# Patient Record
Sex: Female | Born: 1965 | Race: White | Hispanic: No | Marital: Married | Smoking: Former smoker
Health system: Southern US, Community
[De-identification: ages and names within clinical notes are randomized; demographics above are authoritative.]

## PROBLEM LIST (undated history)

## (undated) DIAGNOSIS — F329 Major depressive disorder, single episode, unspecified: Secondary | ICD-10-CM

## (undated) DIAGNOSIS — E8770 Fluid overload, unspecified: Secondary | ICD-10-CM

## (undated) DIAGNOSIS — J449 Chronic obstructive pulmonary disease, unspecified: Secondary | ICD-10-CM

## (undated) DIAGNOSIS — K219 Gastro-esophageal reflux disease without esophagitis: Secondary | ICD-10-CM

## (undated) DIAGNOSIS — M1460 Charcot's joint, unspecified site: Secondary | ICD-10-CM

## (undated) DIAGNOSIS — D649 Anemia, unspecified: Secondary | ICD-10-CM

## (undated) HISTORY — PX: APPENDECTOMY: SHX54

## (undated) HISTORY — PX: CHOLECYSTECTOMY: SHX55

---

## 2002-05-25 ENCOUNTER — Emergency Department (HOSPITAL_COMMUNITY): Admission: EM | Admit: 2002-05-25 | Discharge: 2002-05-25 | Payer: Self-pay | Admitting: Emergency Medicine

## 2002-06-09 ENCOUNTER — Encounter: Admission: RE | Admit: 2002-06-09 | Discharge: 2002-06-09 | Payer: Self-pay | Admitting: Internal Medicine

## 2002-06-17 ENCOUNTER — Encounter: Admission: RE | Admit: 2002-06-17 | Discharge: 2002-06-17 | Payer: Self-pay | Admitting: Internal Medicine

## 2002-06-24 ENCOUNTER — Inpatient Hospital Stay (HOSPITAL_COMMUNITY): Admission: AD | Admit: 2002-06-24 | Discharge: 2002-06-29 | Payer: Self-pay | Admitting: Internal Medicine

## 2002-06-24 ENCOUNTER — Encounter: Admission: RE | Admit: 2002-06-24 | Discharge: 2002-06-24 | Payer: Self-pay | Admitting: Internal Medicine

## 2002-06-25 ENCOUNTER — Encounter: Payer: Self-pay | Admitting: Neurology

## 2002-06-25 ENCOUNTER — Encounter: Payer: Self-pay | Admitting: Cardiology

## 2002-06-26 ENCOUNTER — Encounter: Payer: Self-pay | Admitting: Neurology

## 2002-06-28 ENCOUNTER — Encounter: Payer: Self-pay | Admitting: Internal Medicine

## 2002-07-12 ENCOUNTER — Encounter: Admission: RE | Admit: 2002-07-12 | Discharge: 2002-07-12 | Payer: Self-pay | Admitting: Internal Medicine

## 2002-07-29 ENCOUNTER — Encounter: Admission: RE | Admit: 2002-07-29 | Discharge: 2002-07-29 | Payer: Self-pay | Admitting: Internal Medicine

## 2002-08-25 ENCOUNTER — Encounter: Payer: Self-pay | Admitting: Internal Medicine

## 2002-08-25 ENCOUNTER — Encounter: Admission: RE | Admit: 2002-08-25 | Discharge: 2002-08-25 | Payer: Self-pay | Admitting: Internal Medicine

## 2002-08-25 ENCOUNTER — Ambulatory Visit (HOSPITAL_COMMUNITY): Admission: RE | Admit: 2002-08-25 | Discharge: 2002-08-25 | Payer: Self-pay | Admitting: Internal Medicine

## 2002-08-30 ENCOUNTER — Encounter: Admission: RE | Admit: 2002-08-30 | Discharge: 2002-08-30 | Payer: Self-pay | Admitting: Internal Medicine

## 2002-10-15 ENCOUNTER — Encounter: Admission: RE | Admit: 2002-10-15 | Discharge: 2002-10-15 | Payer: Self-pay | Admitting: Internal Medicine

## 2002-10-28 ENCOUNTER — Encounter: Admission: RE | Admit: 2002-10-28 | Discharge: 2002-10-28 | Payer: Self-pay | Admitting: Infectious Diseases

## 2003-01-28 ENCOUNTER — Encounter: Admission: RE | Admit: 2003-01-28 | Discharge: 2003-01-28 | Payer: Self-pay | Admitting: Internal Medicine

## 2003-02-09 ENCOUNTER — Encounter: Admission: RE | Admit: 2003-02-09 | Discharge: 2003-02-09 | Payer: Self-pay | Admitting: Internal Medicine

## 2003-03-14 ENCOUNTER — Encounter: Admission: RE | Admit: 2003-03-14 | Discharge: 2003-03-14 | Payer: Self-pay | Admitting: Internal Medicine

## 2003-03-16 ENCOUNTER — Inpatient Hospital Stay (HOSPITAL_COMMUNITY): Admission: EM | Admit: 2003-03-16 | Discharge: 2003-03-18 | Payer: Self-pay | Admitting: Emergency Medicine

## 2003-04-01 ENCOUNTER — Encounter: Admission: RE | Admit: 2003-04-01 | Discharge: 2003-04-01 | Payer: Self-pay | Admitting: Internal Medicine

## 2003-04-04 ENCOUNTER — Encounter (HOSPITAL_BASED_OUTPATIENT_CLINIC_OR_DEPARTMENT_OTHER): Admission: RE | Admit: 2003-04-04 | Discharge: 2003-07-03 | Payer: Self-pay | Admitting: Internal Medicine

## 2003-06-28 ENCOUNTER — Encounter: Admission: RE | Admit: 2003-06-28 | Discharge: 2003-06-28 | Payer: Self-pay | Admitting: Internal Medicine

## 2003-07-26 ENCOUNTER — Encounter: Admission: RE | Admit: 2003-07-26 | Discharge: 2003-07-26 | Payer: Self-pay | Admitting: Internal Medicine

## 2003-08-11 ENCOUNTER — Encounter: Admission: RE | Admit: 2003-08-11 | Discharge: 2003-08-11 | Payer: Self-pay | Admitting: Internal Medicine

## 2003-08-29 ENCOUNTER — Encounter: Admission: RE | Admit: 2003-08-29 | Discharge: 2003-08-29 | Payer: Self-pay | Admitting: Internal Medicine

## 2003-12-11 ENCOUNTER — Ambulatory Visit (HOSPITAL_BASED_OUTPATIENT_CLINIC_OR_DEPARTMENT_OTHER): Admission: RE | Admit: 2003-12-11 | Discharge: 2003-12-11 | Payer: Self-pay | Admitting: Internal Medicine

## 2004-01-11 ENCOUNTER — Ambulatory Visit: Payer: Self-pay | Admitting: Internal Medicine

## 2004-01-31 ENCOUNTER — Observation Stay: Payer: Self-pay | Admitting: Internal Medicine

## 2004-02-05 ENCOUNTER — Emergency Department: Payer: Self-pay | Admitting: Emergency Medicine

## 2004-02-09 ENCOUNTER — Ambulatory Visit: Payer: Self-pay | Admitting: Internal Medicine

## 2004-02-27 ENCOUNTER — Ambulatory Visit: Payer: Self-pay | Admitting: Internal Medicine

## 2004-03-08 ENCOUNTER — Ambulatory Visit: Payer: Self-pay | Admitting: Internal Medicine

## 2004-03-13 ENCOUNTER — Ambulatory Visit: Payer: Self-pay | Admitting: Internal Medicine

## 2004-07-05 ENCOUNTER — Ambulatory Visit: Payer: Self-pay | Admitting: Internal Medicine

## 2004-09-17 ENCOUNTER — Emergency Department: Payer: Self-pay | Admitting: Emergency Medicine

## 2004-10-08 ENCOUNTER — Ambulatory Visit: Payer: Self-pay | Admitting: Internal Medicine

## 2004-11-20 ENCOUNTER — Ambulatory Visit: Payer: Self-pay | Admitting: Hospitalist

## 2004-12-06 ENCOUNTER — Ambulatory Visit: Payer: Self-pay | Admitting: Internal Medicine

## 2004-12-18 ENCOUNTER — Ambulatory Visit: Payer: Self-pay | Admitting: Internal Medicine

## 2004-12-25 ENCOUNTER — Ambulatory Visit: Payer: Self-pay | Admitting: Internal Medicine

## 2005-02-08 ENCOUNTER — Emergency Department: Payer: Self-pay | Admitting: Emergency Medicine

## 2005-03-09 ENCOUNTER — Emergency Department: Payer: Self-pay | Admitting: Emergency Medicine

## 2005-03-11 ENCOUNTER — Ambulatory Visit: Payer: Self-pay | Admitting: Hospitalist

## 2005-04-03 ENCOUNTER — Emergency Department: Payer: Self-pay | Admitting: Emergency Medicine

## 2005-04-09 ENCOUNTER — Emergency Department: Payer: Self-pay | Admitting: Emergency Medicine

## 2005-04-17 ENCOUNTER — Emergency Department: Payer: Self-pay | Admitting: Emergency Medicine

## 2005-09-17 ENCOUNTER — Ambulatory Visit: Payer: Self-pay | Admitting: Internal Medicine

## 2005-11-26 ENCOUNTER — Emergency Department: Payer: Self-pay | Admitting: Emergency Medicine

## 2005-12-07 ENCOUNTER — Inpatient Hospital Stay: Payer: Self-pay | Admitting: Internal Medicine

## 2005-12-29 ENCOUNTER — Inpatient Hospital Stay: Payer: Self-pay | Admitting: Internal Medicine

## 2006-01-06 DIAGNOSIS — I872 Venous insufficiency (chronic) (peripheral): Secondary | ICD-10-CM

## 2006-01-06 DIAGNOSIS — G600 Hereditary motor and sensory neuropathy: Secondary | ICD-10-CM | POA: Insufficient documentation

## 2006-01-06 DIAGNOSIS — E785 Hyperlipidemia, unspecified: Secondary | ICD-10-CM | POA: Insufficient documentation

## 2006-01-06 DIAGNOSIS — J309 Allergic rhinitis, unspecified: Secondary | ICD-10-CM | POA: Insufficient documentation

## 2006-01-06 DIAGNOSIS — R609 Edema, unspecified: Secondary | ICD-10-CM | POA: Insufficient documentation

## 2006-01-06 DIAGNOSIS — F32A Depression, unspecified: Secondary | ICD-10-CM | POA: Insufficient documentation

## 2006-01-06 DIAGNOSIS — F17200 Nicotine dependence, unspecified, uncomplicated: Secondary | ICD-10-CM | POA: Insufficient documentation

## 2006-01-06 DIAGNOSIS — K009 Disorder of tooth development, unspecified: Secondary | ICD-10-CM | POA: Insufficient documentation

## 2006-01-06 DIAGNOSIS — M545 Low back pain, unspecified: Secondary | ICD-10-CM | POA: Insufficient documentation

## 2006-01-06 DIAGNOSIS — K149 Disease of tongue, unspecified: Secondary | ICD-10-CM | POA: Insufficient documentation

## 2006-01-06 DIAGNOSIS — G4730 Sleep apnea, unspecified: Secondary | ICD-10-CM | POA: Insufficient documentation

## 2006-01-06 DIAGNOSIS — Z981 Arthrodesis status: Secondary | ICD-10-CM | POA: Insufficient documentation

## 2006-01-06 DIAGNOSIS — B379 Candidiasis, unspecified: Secondary | ICD-10-CM | POA: Insufficient documentation

## 2006-01-06 DIAGNOSIS — F329 Major depressive disorder, single episode, unspecified: Secondary | ICD-10-CM | POA: Insufficient documentation

## 2006-01-06 DIAGNOSIS — K219 Gastro-esophageal reflux disease without esophagitis: Secondary | ICD-10-CM | POA: Insufficient documentation

## 2006-01-06 DIAGNOSIS — K006 Disturbances in tooth eruption: Secondary | ICD-10-CM | POA: Insufficient documentation

## 2006-01-06 DIAGNOSIS — E876 Hypokalemia: Secondary | ICD-10-CM | POA: Insufficient documentation

## 2006-01-16 ENCOUNTER — Ambulatory Visit: Payer: Self-pay | Admitting: Internal Medicine

## 2006-01-16 ENCOUNTER — Encounter (INDEPENDENT_AMBULATORY_CARE_PROVIDER_SITE_OTHER): Payer: Self-pay | Admitting: Unknown Physician Specialty

## 2006-01-16 LAB — CONVERTED CEMR LAB
Amphetamine Screen, Ur: NEGATIVE
Barbiturate Quant, Ur: NEGATIVE
Benzodiazepines.: NEGATIVE
Marijuana Metabolite: NEGATIVE
Methadone: NEGATIVE
Opiates: POSITIVE — AB
Propoxyphene: NEGATIVE

## 2006-01-25 ENCOUNTER — Emergency Department: Payer: Self-pay | Admitting: Emergency Medicine

## 2006-01-25 ENCOUNTER — Other Ambulatory Visit: Payer: Self-pay

## 2006-02-04 ENCOUNTER — Encounter: Admission: RE | Admit: 2006-02-04 | Discharge: 2006-02-04 | Payer: Self-pay | Admitting: Internal Medicine

## 2006-02-04 ENCOUNTER — Ambulatory Visit: Payer: Self-pay | Admitting: Cardiology

## 2006-02-04 ENCOUNTER — Ambulatory Visit: Payer: Self-pay | Admitting: Hospitalist

## 2006-02-04 ENCOUNTER — Inpatient Hospital Stay (HOSPITAL_COMMUNITY): Admission: AD | Admit: 2006-02-04 | Discharge: 2006-02-08 | Payer: Self-pay | Admitting: Hospitalist

## 2006-02-04 ENCOUNTER — Encounter: Payer: Self-pay | Admitting: Vascular Surgery

## 2006-02-04 ENCOUNTER — Ambulatory Visit: Payer: Self-pay | Admitting: Internal Medicine

## 2006-02-05 ENCOUNTER — Encounter: Payer: Self-pay | Admitting: Cardiology

## 2006-02-07 ENCOUNTER — Encounter (INDEPENDENT_AMBULATORY_CARE_PROVIDER_SITE_OTHER): Payer: Self-pay | Admitting: *Deleted

## 2006-02-08 DIAGNOSIS — A0472 Enterocolitis due to Clostridium difficile, not specified as recurrent: Secondary | ICD-10-CM | POA: Insufficient documentation

## 2006-02-13 ENCOUNTER — Observation Stay: Payer: Self-pay | Admitting: Internal Medicine

## 2006-02-17 ENCOUNTER — Ambulatory Visit: Payer: Self-pay | Admitting: Gastroenterology

## 2006-02-27 ENCOUNTER — Emergency Department: Payer: Self-pay | Admitting: Emergency Medicine

## 2006-03-20 ENCOUNTER — Telehealth (INDEPENDENT_AMBULATORY_CARE_PROVIDER_SITE_OTHER): Payer: Self-pay | Admitting: *Deleted

## 2006-04-18 ENCOUNTER — Telehealth (INDEPENDENT_AMBULATORY_CARE_PROVIDER_SITE_OTHER): Payer: Self-pay | Admitting: *Deleted

## 2006-04-22 ENCOUNTER — Telehealth (INDEPENDENT_AMBULATORY_CARE_PROVIDER_SITE_OTHER): Payer: Self-pay | Admitting: *Deleted

## 2006-04-23 ENCOUNTER — Encounter (INDEPENDENT_AMBULATORY_CARE_PROVIDER_SITE_OTHER): Payer: Self-pay | Admitting: Internal Medicine

## 2006-04-24 ENCOUNTER — Telehealth (INDEPENDENT_AMBULATORY_CARE_PROVIDER_SITE_OTHER): Payer: Self-pay | Admitting: *Deleted

## 2006-04-30 ENCOUNTER — Telehealth: Payer: Self-pay | Admitting: *Deleted

## 2006-05-13 ENCOUNTER — Ambulatory Visit: Payer: Self-pay | Admitting: Hospitalist

## 2006-05-13 ENCOUNTER — Encounter (INDEPENDENT_AMBULATORY_CARE_PROVIDER_SITE_OTHER): Payer: Self-pay | Admitting: Infectious Diseases

## 2006-05-13 DIAGNOSIS — N3000 Acute cystitis without hematuria: Secondary | ICD-10-CM | POA: Insufficient documentation

## 2006-05-13 LAB — CONVERTED CEMR LAB
BUN: 10 mg/dL (ref 6–23)
Bilirubin Urine: NEGATIVE
Calcium: 8.1 mg/dL — ABNORMAL LOW (ref 8.4–10.5)
Creatinine, Ser: 0.4 mg/dL (ref 0.40–1.20)
Glucose, Bld: 89 mg/dL (ref 70–99)
Ketones, ur: NEGATIVE mg/dL
Nitrite: NEGATIVE
Potassium: 3.7 meq/L (ref 3.5–5.3)
pH: 7.5 (ref 5.0–8.0)

## 2006-05-20 ENCOUNTER — Telehealth (INDEPENDENT_AMBULATORY_CARE_PROVIDER_SITE_OTHER): Payer: Self-pay | Admitting: *Deleted

## 2006-05-27 ENCOUNTER — Telehealth: Payer: Self-pay | Admitting: *Deleted

## 2006-06-03 ENCOUNTER — Telehealth (INDEPENDENT_AMBULATORY_CARE_PROVIDER_SITE_OTHER): Payer: Self-pay | Admitting: *Deleted

## 2006-06-07 ENCOUNTER — Inpatient Hospital Stay: Payer: Self-pay | Admitting: Unknown Physician Specialty

## 2006-06-09 ENCOUNTER — Encounter (INDEPENDENT_AMBULATORY_CARE_PROVIDER_SITE_OTHER): Payer: Self-pay | Admitting: Internal Medicine

## 2006-06-13 ENCOUNTER — Telehealth: Payer: Self-pay | Admitting: *Deleted

## 2006-06-16 ENCOUNTER — Telehealth (INDEPENDENT_AMBULATORY_CARE_PROVIDER_SITE_OTHER): Payer: Self-pay | Admitting: *Deleted

## 2006-06-17 ENCOUNTER — Encounter (INDEPENDENT_AMBULATORY_CARE_PROVIDER_SITE_OTHER): Payer: Self-pay | Admitting: Hospitalist

## 2006-06-17 ENCOUNTER — Telehealth: Payer: Self-pay | Admitting: *Deleted

## 2006-07-03 ENCOUNTER — Telehealth (INDEPENDENT_AMBULATORY_CARE_PROVIDER_SITE_OTHER): Payer: Self-pay | Admitting: Internal Medicine

## 2006-07-07 ENCOUNTER — Telehealth (INDEPENDENT_AMBULATORY_CARE_PROVIDER_SITE_OTHER): Payer: Self-pay | Admitting: Pharmacy Technician

## 2006-07-11 ENCOUNTER — Telehealth: Payer: Self-pay | Admitting: *Deleted

## 2006-07-15 ENCOUNTER — Encounter: Payer: Self-pay | Admitting: Internal Medicine

## 2006-07-15 ENCOUNTER — Telehealth: Payer: Self-pay | Admitting: *Deleted

## 2006-07-22 ENCOUNTER — Ambulatory Visit: Payer: Self-pay | Admitting: Internal Medicine

## 2006-07-22 ENCOUNTER — Encounter (INDEPENDENT_AMBULATORY_CARE_PROVIDER_SITE_OTHER): Payer: Self-pay | Admitting: Internal Medicine

## 2006-07-22 DIAGNOSIS — R32 Unspecified urinary incontinence: Secondary | ICD-10-CM | POA: Insufficient documentation

## 2006-07-23 ENCOUNTER — Telehealth: Payer: Self-pay | Admitting: *Deleted

## 2006-07-23 LAB — CONVERTED CEMR LAB
Bilirubin Urine: NEGATIVE
Protein, ur: NEGATIVE mg/dL
Urobilinogen, UA: 0.2 (ref 0.0–1.0)

## 2006-07-24 ENCOUNTER — Telehealth (INDEPENDENT_AMBULATORY_CARE_PROVIDER_SITE_OTHER): Payer: Self-pay | Admitting: Internal Medicine

## 2006-07-30 ENCOUNTER — Telehealth: Payer: Self-pay | Admitting: *Deleted

## 2006-08-15 ENCOUNTER — Encounter (INDEPENDENT_AMBULATORY_CARE_PROVIDER_SITE_OTHER): Payer: Self-pay | Admitting: Internal Medicine

## 2006-08-15 ENCOUNTER — Telehealth (INDEPENDENT_AMBULATORY_CARE_PROVIDER_SITE_OTHER): Payer: Self-pay | Admitting: *Deleted

## 2006-08-18 ENCOUNTER — Telehealth (INDEPENDENT_AMBULATORY_CARE_PROVIDER_SITE_OTHER): Payer: Self-pay | Admitting: Internal Medicine

## 2006-08-19 ENCOUNTER — Encounter (INDEPENDENT_AMBULATORY_CARE_PROVIDER_SITE_OTHER): Payer: Self-pay | Admitting: Internal Medicine

## 2006-08-19 ENCOUNTER — Telehealth: Payer: Self-pay | Admitting: *Deleted

## 2006-09-01 ENCOUNTER — Telehealth: Payer: Self-pay | Admitting: *Deleted

## 2006-09-08 ENCOUNTER — Telehealth: Payer: Self-pay | Admitting: *Deleted

## 2006-09-08 ENCOUNTER — Telehealth (INDEPENDENT_AMBULATORY_CARE_PROVIDER_SITE_OTHER): Payer: Self-pay | Admitting: *Deleted

## 2006-09-10 ENCOUNTER — Inpatient Hospital Stay: Payer: Self-pay | Admitting: Internal Medicine

## 2006-09-16 ENCOUNTER — Telehealth (INDEPENDENT_AMBULATORY_CARE_PROVIDER_SITE_OTHER): Payer: Self-pay | Admitting: Internal Medicine

## 2006-09-17 ENCOUNTER — Encounter (INDEPENDENT_AMBULATORY_CARE_PROVIDER_SITE_OTHER): Payer: Self-pay | Admitting: Internal Medicine

## 2006-09-25 ENCOUNTER — Ambulatory Visit: Payer: Self-pay | Admitting: Internal Medicine

## 2006-09-25 ENCOUNTER — Encounter (INDEPENDENT_AMBULATORY_CARE_PROVIDER_SITE_OTHER): Payer: Self-pay | Admitting: Internal Medicine

## 2006-09-26 ENCOUNTER — Encounter (INDEPENDENT_AMBULATORY_CARE_PROVIDER_SITE_OTHER): Payer: Self-pay | Admitting: Internal Medicine

## 2006-09-28 LAB — CONVERTED CEMR LAB
Hemoglobin, Urine: NEGATIVE
Specific Gravity, Urine: 1.013 (ref 1.005–1.03)
Urine Glucose: NEGATIVE mg/dL
pH: 7 (ref 5.0–8.0)

## 2006-09-30 ENCOUNTER — Telehealth: Payer: Self-pay | Admitting: Infectious Disease

## 2006-10-10 ENCOUNTER — Telehealth: Payer: Self-pay | Admitting: *Deleted

## 2006-10-15 ENCOUNTER — Encounter (INDEPENDENT_AMBULATORY_CARE_PROVIDER_SITE_OTHER): Payer: Self-pay | Admitting: Internal Medicine

## 2006-10-15 ENCOUNTER — Telehealth: Payer: Self-pay | Admitting: Infectious Disease

## 2006-10-21 ENCOUNTER — Telehealth: Payer: Self-pay | Admitting: *Deleted

## 2006-10-21 ENCOUNTER — Encounter (INDEPENDENT_AMBULATORY_CARE_PROVIDER_SITE_OTHER): Payer: Self-pay | Admitting: Internal Medicine

## 2006-11-17 ENCOUNTER — Encounter (INDEPENDENT_AMBULATORY_CARE_PROVIDER_SITE_OTHER): Payer: Self-pay | Admitting: Internal Medicine

## 2006-11-17 ENCOUNTER — Telehealth (INDEPENDENT_AMBULATORY_CARE_PROVIDER_SITE_OTHER): Payer: Self-pay | Admitting: *Deleted

## 2007-05-11 DIAGNOSIS — J45909 Unspecified asthma, uncomplicated: Secondary | ICD-10-CM | POA: Insufficient documentation

## 2007-05-11 DIAGNOSIS — M159 Polyosteoarthritis, unspecified: Secondary | ICD-10-CM | POA: Insufficient documentation

## 2007-05-11 DIAGNOSIS — G600 Hereditary motor and sensory neuropathy: Secondary | ICD-10-CM | POA: Insufficient documentation

## 2007-05-15 DIAGNOSIS — E559 Vitamin D deficiency, unspecified: Secondary | ICD-10-CM | POA: Insufficient documentation

## 2007-09-16 ENCOUNTER — Inpatient Hospital Stay: Payer: Self-pay | Admitting: Internal Medicine

## 2007-09-16 ENCOUNTER — Other Ambulatory Visit: Payer: Self-pay

## 2007-09-23 ENCOUNTER — Other Ambulatory Visit: Payer: Self-pay

## 2007-09-23 ENCOUNTER — Inpatient Hospital Stay: Payer: Self-pay | Admitting: Internal Medicine

## 2007-11-13 ENCOUNTER — Other Ambulatory Visit: Payer: Self-pay

## 2007-11-13 ENCOUNTER — Inpatient Hospital Stay: Payer: Self-pay | Admitting: *Deleted

## 2008-05-06 ENCOUNTER — Inpatient Hospital Stay: Payer: Self-pay | Admitting: Internal Medicine

## 2008-06-03 ENCOUNTER — Inpatient Hospital Stay: Payer: Self-pay | Admitting: Internal Medicine

## 2008-07-22 ENCOUNTER — Inpatient Hospital Stay: Payer: Self-pay | Admitting: Internal Medicine

## 2008-08-10 ENCOUNTER — Inpatient Hospital Stay: Payer: Self-pay | Admitting: Internal Medicine

## 2009-01-06 ENCOUNTER — Inpatient Hospital Stay: Payer: Self-pay | Admitting: Internal Medicine

## 2009-01-17 ENCOUNTER — Emergency Department: Payer: Self-pay | Admitting: Emergency Medicine

## 2009-02-22 ENCOUNTER — Emergency Department: Payer: Self-pay | Admitting: Emergency Medicine

## 2009-04-17 ENCOUNTER — Emergency Department: Payer: Self-pay | Admitting: Emergency Medicine

## 2009-04-19 DIAGNOSIS — Z720 Tobacco use: Secondary | ICD-10-CM | POA: Insufficient documentation

## 2009-04-23 ENCOUNTER — Emergency Department: Payer: Self-pay | Admitting: Emergency Medicine

## 2009-06-26 ENCOUNTER — Emergency Department: Payer: Self-pay | Admitting: Emergency Medicine

## 2009-06-28 ENCOUNTER — Emergency Department: Payer: Self-pay | Admitting: Emergency Medicine

## 2009-07-04 ENCOUNTER — Inpatient Hospital Stay: Payer: Self-pay | Admitting: Internal Medicine

## 2009-07-27 ENCOUNTER — Emergency Department: Payer: Self-pay | Admitting: Emergency Medicine

## 2009-08-01 ENCOUNTER — Ambulatory Visit: Payer: Self-pay | Admitting: Internal Medicine

## 2009-08-08 ENCOUNTER — Ambulatory Visit: Payer: Self-pay | Admitting: Internal Medicine

## 2009-09-01 ENCOUNTER — Emergency Department: Payer: Self-pay | Admitting: Emergency Medicine

## 2009-10-18 DIAGNOSIS — J449 Chronic obstructive pulmonary disease, unspecified: Secondary | ICD-10-CM

## 2009-10-29 ENCOUNTER — Emergency Department: Payer: Self-pay | Admitting: Emergency Medicine

## 2009-11-01 ENCOUNTER — Emergency Department: Payer: Self-pay | Admitting: Emergency Medicine

## 2009-11-06 ENCOUNTER — Inpatient Hospital Stay: Payer: Self-pay | Admitting: Specialist

## 2009-11-14 ENCOUNTER — Inpatient Hospital Stay: Payer: Self-pay | Admitting: Internal Medicine

## 2010-02-04 ENCOUNTER — Inpatient Hospital Stay: Payer: Self-pay | Admitting: Internal Medicine

## 2010-02-10 ENCOUNTER — Observation Stay: Payer: Self-pay | Admitting: Internal Medicine
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
## 2010-07-06 NOTE — Consult Note (Signed)
Melanie Berry, Melanie Berry                ACCOUNT NO.:  [id number]   MEDICAL RECORD NO.:  [id number]          PATIENT TYPE:  INP   LOCATION:  5016                         FACILITY:  [facility]   PHYSICIAN:  Casimiro Needle L. Reynolds, M.D.DATE OF BIRTH:  [birth year]   DATE OF CONSULTATION:  02/06/2006  DATE OF DISCHARGE:                                 CONSULTATION   REFERRING PHYSICIAN:  Eliseo Gum, MD   REASON FOR EVALUATION:  Leg pain.   HISTORY OF PRESENT ILLNESS:  This is an inpatient  consultation/evaluation of this existing [facility]  patient.  A 45 year old woman who has been diagnosed in the past with  Charcot-Marie-Tooth disease at the age of 45, which has resulted in a  chronic progressive disability including gait failure and contractures  of the hands and fingers.  She states that she has had pain in her legs  for years and this has gradually been getting worse.  Both legs are  about the same in terms of pain.  She says that she feels as if the  bones want to come out the bottom.  Sometimes there is a tingling  sensation.  She has similar, but much milder, sensations in the hands.  She also has a lot of problems with swelling and edema in her legs and  legs turning red and blue.  She has some series of skin breakdown  related to the chronic swelling in her feet.  She is wheelchair  dependent due to her neuropathy and also chronic low back pain.  She was  admitted to the hospital on February 04, 2006, with diarrhea and  neurologic consultation is requested.   PAST MEDICAL HISTORY:  Remarkable for Charcot-Marie-Tooth disease as  above.  Other chronic medical problems include:  1. Chronic obstructive pulmonary disease.  2. Gastroesophageal reflux disease.  3. Anxiety.  4. History of cholecystectomy and appendectomy.   FAMILY HISTORY:  She is adopted and does not know her family history but  her son is affected with CMT.   SOCIAL HISTORY:  She is married and  on disability.  She lives with her  husband and is dependent in pretty much all of her activities of daily  living.   MEDICATIONS:  Per the patient H and P.  Remarkable primarily for:  1. MS-Contin 45 mg t.i.d.  2. Morphine sulfate immediate release 30 mg b.i.d. for pain.  3. Effexor XR.  4. Klonopin.  5. Flexeril.   REVIEW OF SYSTEMS:  Per admission H and P.   PHYSICAL EXAMINATION:  VITAL SIGNS:  Temperature 97.2, blood pressure  117/88, pulse 96, respirations 20.  O2 sat 96% on room air.  GENERAL:  This is a chronically ill-appearing obese woman supine on the  hospital bed.  No evident distress.  HEENT:  Head:  Cranium is normocephalic and atraumatic.  Oropharynx is  benign.  NECK:  Supple without carotid or supraclavicular bruits.  HEART:  Regular rate and rhythm without murmurs.  NEUROLOGIC:  Mental status:  She is awake and alert.  Her speech is  slow.  Not dysarthric.  Mood  is euthymic and affect appropriate.  Cranial nerves:  Pupils are equal and reactive.  Extraocular movements  full without nystagmus.  Face, tongue, and palate move normally and  symmetrically.  Motor:  Decreased bulk in the hands.  Tone is normal.  She has fairly good proximal strength in the upper and lower  extremities.  However, in the distal upper extremities she demonstrates  significant weakness of finger extension and severe weakness of finger  flexion and finger abduction, as well as, thumb abduction and mild-to-  moderate weakness of thumb flexion and finger flexion with good wrist  strength.  In the lower extremities, she has good strength of hip and  knee flexion but only slight strength in dorsiflexion, no strength in  planar flexion, and no voluntary movements of the toes.  Sensory:  She  reports diminished pinprick sensation to the ankle, a little bit more so  on the left than on the right; otherwise, intact throughout.  She  reports diminished proprioceptive sensation in the all toes.   Coordination:  Finger-to-nose performed adequately.  Gait:  Deferred.  Reflexes:  Unobtainable throughout.   IMPRESSION:  1. Charcot-Marie-Tooth disease.  2. Lower extremity swelling and upper extremity contractures to #1      above.  3. Pain syndrome secondary to #1 and #2 above with a component of      chronic low back pain and possible chronic lumbosacral      radiculopathy.   RECOMMENDATIONS:  She will need TED hose, which is the only real  management for the lower extremity edema.  She also needs ongoing  symptomatic treatment for pain.  It would be helpful to have a pain  management consult at this point.  Mostly what she needs is to establish  with a muscular dystrophy association clinic, such as the one at [facility]  or at [facility], who could help her out by providing TED hose, other  equipment as needed at home, and other such issues.  The patient has  very limited financial resources and any help that she could get from  the MDA would be useful.  Thank you for the consultation.      Michael L. Thad Ranger, M.D.  Electronically Signed     MLR/MEDQ  D:  02/06/2006  T:  02/07/2006  Job:  [id number]

## 2010-07-06 NOTE — Procedures (Signed)
Melanie Berry, Melanie Berry NO.:  [id number]   MEDICAL RECORD NO.:  [id number]          PATIENT TYPE:  OUT   LOCATION:  SLEEP CENTER                 FACILITY:  [facility]   PHYSICIAN:  Clinton D. Maple Hudson, M.D. DATE OF BIRTH:  05/31/1965   DATE OF STUDY:  12/11/2003                              NOCTURNAL POLYSOMNOGRAM   INDICATIONS FOR STUDY:  Hypersomnia with sleep apnea. The patient is  disabled in hospital bed with head elevated. Epworth sleepiness score  5/24,  neck size 17 inches, BMI 46.8, weight 275 pounds.   SLEEP ARCHITECTURE:  Took regular nighttimes meds including Ambien. Total  sleep time 354 minutes with sleep efficiency of 91 %. Stage I was 12%, stage  II was 62%, stages II and IV were 26%, REM was absent. Latency to sleep  onset 22.5 minutes. Awake after sleep onset 17 minutes. Arousal index 38,  which is increased. Most arousals were nonspecific and not clearly related  to respiratory or other events.   RESPIRATORY DATA:  RDI 5.3 per hour which is at the upper limit of  normal/borderline abnormal. This reflects 28 hypopneas and 3 obstructive  apneas. She slept mostly on her back and most events were while supine.   OXYGEN DATA:  Mild to moderate snoring with oxygen desaturation to a nadir  of 83%. Mean oxygen saturation through the study was 92% on room air.   CARDIAC DATA:  Normal sinus rhythm.   MOVEMENT/PARASOMNIA:  Occasional leg jerk with insignificant impact on  sleep.   IMPRESSION/RECOMMENDATIONS:  At upper limits of normal or borderline for  extremely mild obstructive sleep apnea/hypopnea syndrome, RDI 5.3 per hour  with desaturation to 83%. CPAP is not usually indicated for scores in this  range, unless there are very special circumstances or evidence of  respiratory failure. She may benefit from weight loss and evaluation for  alternative therapies if appropriate.                                                           Clinton D. Maple Hudson,  M.D.  Diplomate, American Board   CDY/MEDQ  D:  12/18/2003 12:09:10  T:  12/18/2003 60:45:40  Job:  [id number]

## 2010-07-06 NOTE — Discharge Summary (Signed)
Melanie Berry, Melanie Berry                            ACCOUNT NO.:  [id number]   MEDICAL RECORD NO.:  [id number]                   PATIENT TYPE:  INP   LOCATION:  5743                                 FACILITY:  [facility]   PHYSICIAN:  C. Ulyess Mort, M.D.             DATE OF BIRTH:  09-Mar-1965   DATE OF ADMISSION:  06/24/2002  DATE OF DISCHARGE:  06/29/2002                                 DISCHARGE SUMMARY   DISCHARGE DIAGNOSES:  1. Lower extremity swelling.  2. Left leg and ankle pain.  3. Chronic back pain.  4. Depression/anxiety.  5. Gastroesophageal reflux disease.  6. Urinary tract infection.  7. Charcot-Marie-Tooth syndrome.   DISCHARGE MEDICATIONS:  1. Flexeril 10 mg p.o. t.i.d.  2. Effexor XR 75 mg p.o. daily.  3. Colace 100 mg p.o. b.i.d.  4. Klonopin 1 mg p.o. t.i.d.  5. MS Contin 30 mg SR p.o. q.12h., #28 given.  6. Protonix 40 mg p.o. daily.  7. Singulair 10 mg p.o. q.h.s.  8. MSIR 15 mg p.o. q.6h. p.r.n. for pain, #48 given.  9. Cipro 250 mg p.o. b.i.d. x3 days.  10.      Ambien 10 mg p.o. q.h.s. for sleep.  11.      Diflucan 150 mg p.o. x1 for yeast infection.  12.      Phenergan 12.5 mg/25 mg p.o. q.6h. p.r.n. nausea.   CHIEF COMPLAINT:  Bilateral lower extremity pain and swelling.   HISTORY OF PRESENT ILLNESS:  Ms. Melanie Berry is a 45 year old white female with a  past medical history significant for Charcot-Marie-Tooth who presented to  [facility] with complaint of lower extremity pain  and swelling, saying that her feet were turning purple for the last 3-4  months, left greater than right.  The patient first noted increased swelling  of the left foot approximately one month ago, said that there was no  necessarily exacerbating factor, that her foot would turn bluish/purple and  she said that initially that elevation would help, but continued to occur.  She states that the pain is approximately a 10/10, a sharp, throbbing  sensation, no  relief with Tylenol No. 3.  The patient was previously seen by  Dr. Sherrie Mustache in [place], and followed by Dr. Donor at [facility] for her  Charcot-Marie-Tooth.  She has also been evaluated and seen at pain clinic,  but has stopped going there.   ALLERGIES:  1. SULFA (causes rash).  2. BIAXIN (causes nausea and vomiting).  3. IODINE (questionable anaphylaxis).  4. SHELLFISH (questionable anaphylaxis.  5. ASPIRIN (causes gastritis).  6. NEURONTIN (causes swelling).  7. DARVOCET (causes nausea and vomiting.   PAST SURGICAL HISTORY:  Secondary to Charcot-Marie-Tooth, she has had a  fusion of her right and left ankle with pinning.   SUBSTANCE HISTORY:  She smokes approximately 1-1/2 packs per day for 24  years.  No other drug  use.   SOCIAL HISTORY:  She is married.  She has Medicaid.  She lives in [place]  with her husband.  She is on disability.  She has one son and one step-  child.   PHYSICAL EXAMINATION UPON DISCHARGE:  GENERAL:  This is a depressed  appearing obese white female with noted disability in her hands.  HEENT:  Normal.  RESPIRATORY:  Clear to auscultation bilaterally.  No wheezing, crackles or  rhonchi.  CARDIOVASCULAR:  Regular rate and rhythm, no murmurs.  GI:  Obese, soft, nontender, nondistended, positive bowel sounds.  EXTREMITIES:  Bilateral upper and lower extremities are cool to the touch.  She has 2+ pulses in all extremities.  She had approximately trace to 1+  edema upon discharge.  No significant bluish/purple seen during  hospitalization.  Right and left hand has contractures and she has surgical  scars on the bilateral lower extremities.  Good perfusion.  NEUROLOGIC:  She was intact to pain and soft ouch bilateral lower  extremities.  Diminished strength in all extremities.   SIGNIFICANT LABS PRIOR TO DISCHARGE:  Hemoglobin 12.4, MCV 80.9, platelets  374, ESR 14, antiphospholipid is 0, PTT/LA was 49.9 slightly elevated with  all other within  normal range.  Creatinine was 0.4.  All of lab work was  normal.  Complement C3 149, complement C4 25, CRP was 4.7.  Have urine which  was showed many bacteria, rheumatoid factor was less than 20, ANA was  negative, antiphospholipid was negative, cryoglobulin eval was negative at  72 hours.   SCANS OBTAINED DURING HOSPITALIZATION:  Venous Dopplers showed no evidence  of deep venous thrombosis, superficial thrombosis or Baker's cysts  bilaterally.   Lower extremity arterial evaluation were normal bilaterally.  Indices were  1.0.  A 2D echocardiogram showed LVEF of 50-55%, mild mitral valvular  regurgitation, left atrial size was upper limits of normal.   MR of lumbar spine and T spine only showed small broad-based disk bulge at  C5-C6 level, otherwise negative study.  The lumbar showed mild multilevel  spondylosis, no evidence of pathological enhancement.   Left ankle film showed some arthritic changes of forefoot, otherwise  negative.  Bone scan showed degenerative changes in the left ankle with  increased uptake.   HOSPITAL COURSE:  1. LEFT FOOT PAIN AND SWELLING.  During hospitalization the patient did not     exhibit a great deal of change in coloration, although pain waxed and     waned with multiple pain medications.  The patient was started on a long     acting slow release MS Contin with p.r.n. MSIR which controlled her pain     prior to discharge secondary to the patient's diagnosis of Charcot-Marie-     Tooth, did obtain a neurology consult to see if this was in fact related     to her Charcot-Marie-Tooth, evaluated by Dr. Sandria Manly, who suggested further     evaluation with MRI and bone scan, did not feel that this was     significantly related to her Charcot-Marie-Tooth.  The patient did have     low back pain which has been a chronic type issue and left foot pain,     which apparently has been evaluated and treated previously at pain    clinic.  Neurology suggested possibly  trying Zanaflex, the patient was     resistant to this and wanted to continue with Flexeril for this.   1. DEPRESSION.  The patient is continued  Effexor and Klonopin, both of which     were previously given to her by another doctor.  We will need further     psychiatric evaluation and treatment.   1. GASTROESOPHAGEAL REFLUX DISEASE.  Continue on her Protonix.  The patient     did not exhibit any problems of chest pain, or cardiac abnormalities     during hospitalization.   1. URINARY TRACT INFECTION.  The patient prior to discharge exhibited signs     of urinary tract infection, both clinically and with urinalysis showing     many bacteria.  I did give the patient three days of Cipro as well as     Diflucan secondary if she did obtain a urinary tract infection from this.   DISCHARGE INSTRUCTIONS:  I reviewed with the patient the need for close  followup of her condition, as well as reiterated her pain medications will  need close monitoring.  Did get a social work consult to come in and help  with [facility] for aide services set up to come out to the patient's  house.  She is to limit the amount of salt in her diet, as well as keep her  legs elevated as much as possible.  She is to follow up with Dr. Margo Aye in the  outpatient clinic on Monday 07/12/2002 at 3 p.m.  Any other problems to call  [phone].  We will await further evaluation by doctor in [place], Dr.  Rolanda Lundborg for any further input and possible causes of the patient's pain.  The patient will have to be closely monitored on her multiple pain  medications secondary to abuse potential.  The patient previously refused in  several pain clinics prior to this admission.     Catalina Pizza, M.D.                           Gary Fleet, M.D.    ZH/MEDQ  D:  08/18/2002  T:  08/18/2002  Job:  [id number]

## 2010-07-06 NOTE — Discharge Summary (Signed)
NAMEBRAYDEN, Melanie Berry NO.:  [id number]   MEDICAL RECORD NO.:  [id number]          PATIENT TYPE:  INP   LOCATION:  5016                         FACILITY:  [facility]   PHYSICIAN:  Artist Beach, MD        DATE OF BIRTH:  1965/12/27   DATE OF ADMISSION:  02/04/2006  DATE OF DISCHARGE:  02/08/2006                               DISCHARGE SUMMARY   PRIMARY PHYSICIAN:  Peggye Pitt, M.D.   SENIOR/UPPER LEVEL:  Dr. Allena Katz.   DISCHARGE DIAGNOSES:  1. Chronic diarrhea.  2. Pseudomembranous colitis, confirmed by biopsy.  3. Bilateral lower extremity edema, chronic in nature.  4. Morbid obesity.  5. Charcot-Marie-Tooth syndrome.  6. Microcytic anemia with reactive thrombocytosis.  7. Hypoalbuminemia.  8. Chronic obstructive pulmonary disease.  9. Gastroesophageal reflux disease.  10.Anxiety.   PAST HISTORY:  Status post cholecystectomy, appendectomy, bilateral  tubal ligation, multiple surgeries on bilateral ankles due to Charcot-  Marie-Tooth disease.   MEDICATIONS ON DISCHARGE:  1. Metronidazole 250 mg 1 tablet 3 times a day for 13 days.  2. Doxycycline 100 mg tablet 2 times a day for 10 days.  3. Ciprofloxacin 500 mg 1 tablet 2 times a day for 10 days.  4. Advair 250/50, one puff twice a day.  5. Nasonex 2 sprays each nostril daily.  6. Singulair 10 mg daily.  7. Lasix 40 mg 3 times a day.  8. Effexor XR 150 mg 1 tablet daily.  9. Klonopin 0.5 mg, 1 tablet twice a day.  10.Flexeril 10 mg 3 times a day.  11.Ambien CR 12.5, one tablet at bedtime.  12.MS Contin 45 mg b.i.d.  13.MS-IR 30 mg every 4 to 6 hours p.r.n. for pain.  14.KCl 1 tablet twice daily.  15.Compazine 10 mg every 8 hours as needed for nausea.  16.Omeprazole 20 mg 1 tablet daily.  17.Albuterol inhaler 2 puffs every 6 hours as needed for shortness of      breath.   DISPOSITION AND FOLLOWUP:  Patient was discharged in a stable condition  to home.  Her diarrhea had improved a lot since admission.   She is to  follow up in outpatient clinic and she will be called in with an  appointment on Monday, that is February 10, 2006, with an appointment.  She will need a CBC and a BMET for her anemia and potassium level to be  checked.   PROCEDURES:  Patient had a 2D echo on February 05, 2006, which was a  limited study, and the EF was suspected to be normal.  It was an  adequate study for evaluation of left ventricular regional wall motion.  There were no comments about right ventricle being seen.  The valves  were normal.  No pericardial effusion.  Right-sided pressures were  normal.  Patient had  venous Dopplers bilateral lower extremities on  February 04, 2006, which showed no evidence of DVT, superficial  thrombosis, or Baker cyst.  Chest x-ray on February 06, 2006, was  normal.  Patient had a colonoscopy done by Dr. Christella Hartigan on February 07, 2006,  which showed membranous colitis suspected of either C. diff or  ischemic colitis.  Consult Dr. Ferne Coe, neurology, for Charcot-Marie-  Tooth and Dr. Christella Hartigan, GI.   HISTORY OF PRESENT ILLNESS:  Ms. Kozma is a 45 year old Caucasian female  with history of Charcot-Marie-Tooth disease, chronic leg edema, and  several other chronic health problems who presented to clinic with 2-  month history of watery diarrhea approximately 4 to 10 frequencies per  day, yellow to brown in nature with malodor associated with crampy  abdominal pain.  She was admitted to [facility] in October for  the same and was treated with Flagyl for 5 days and was respectively  diagnosed with diverticulitis.  But her diarrhea improved only  transiently to come back again.  Also patient complained of worsening  leg edema with redness.  What prompted her to come to outpatient clinic  for admission was her worsening leg edema.  She had been seen in  November by me in the clinic and advised to have bilateral venous  Dopplers.  But because of lack of transportation and  certain social  issues she could not come back to do it again.  She was mainly admitted  to get those done.  Also patient gave a history of a scratch by her pet  cat 2 weeks ago, which was draining clear fluid.   PHYSICAL EXAMINATION:  VITALS:  On examination temperature 97.4, pulse  99, blood pressure 134/94, respiratory rate 20, saturating 96% on room  air.  GENERAL:  Middle-aged lady, looking elderly for her age.  She was  morbidly obese.  HEENT:  Eyes anicteric.  Pupils equal, round, and reactive to light.  ENT:  Mucous membranes were dry.  Throat was slightly erythematous.  Neck was supple.  LUNGS:  Clear to auscultation bilaterally.  Good air movement.  CARDIOVASCULAR:  Regular rate and rhythm.  No murmur, rub, or gallop.  ABDOMEN:  Soft, nontender.  Bowel sounds decreased, obese.  No  organomegaly.  EXTREMITIES:  She had pitting edema up to knees.  Mild tenderness around  medial ankles.  She had chronic venous stasis changes.  Also, her skin  was dry and rough.  She had approximately 3- to 4-cm long linear scratch  on her lower half of right shin, which was draining clear fluid.  Surrounding skin was slightly erythematous and tender.  Popliteal lymph  nodes were not palpable.  Her left-sided toes were cold to touch.  Pulses were +1 bilaterally.  SKIN:  She had a red rash in intertriginous areas.  MUSCULOSKELETAL:  Limited range of movement at all joints.  NEURO:  Oriented x3.  Strength 3/5 to 4/5 all over.  Contractures in  hands.  PSYCH:  Appropriate.   LABS ON ADMISSION:  Sodium 138, potassium 3.5, chloride 102, bicarb 57,  BUN 7, creatinine 0.5, glucose 87, hemoglobin 13.7, with MCV of 33, RDW  17, white count 10.7, with ANC of 5.5 and lymphs of 0.3.  Liver enzymes  were normal other than her albumin, which was low at 3.  Lipase 22,  coags were normal.  TSH was normal.  C. diff was negative x3.  Stool: Lactoferrin was positive, Hemoccult was positive x1.  Stool was  negative  for O&P and Giardia.   ASSESSMENT AND PLAN:  1. Chronic diarrhea.  Differential was brought to begin with,      malabsorption versus infective versus inflammatory, especially when      her stool lactoferrin and Hemoccult were positive,  it was more      towards infection versus inflammatory.  GI was consulted, who      decided to do colonoscopy because of the chronic nature of diarrhea      to visualize pseudomembrane of her large intestines.  Biopsies were      taken and were read as mild active mucosal colitis associated with      fibrinopurulent exudate.  Differential included pseudomembranous      colitis versus ischemic colitis.  Patient was then started on      metronidazole 500 mg 3 times a day to be continued for 14 days.      Her diarrhea had greatly improved while in hospital.  This was to      be followed up as an outpatient.  If her diarrhea did not improve      on metronidazole or was recurrent, the plan was to treat her with      vanc p.o. and if still persistent could be treated as a long-term      pulse therapy with vanc.  She is to follow up with Dr. Christella Hartigan in      approximately 2 to 3 weeks on discharge.  2. Chronic edema.  She has had extensive workup for this including      venous Dopplers, which was our main concern with her lifestyle      being sedentary, chronic infection and edema, she is more likely      for DVT, which was ruled out.  The other differentials were right-      sided heart failure versus decreased albumin, which was 3 on      admission, versus hypothyroidism, versus anemia.  Also, this could      be reflex sympathetic dystrophy, but it being bilaterally, this was      ruled out.  With her primary disorder of Charcot-Marie-Tooth      disease, even patients are known to have chronic edema, and her      edema improved while she was in the hospital with her legs being      elevated, this most likely goes in favor of chronic venous stasis.       A 2D echo was done to rule out right-sided heart failure and it was      negative.  Patient was put on supplemental protein diet and asked      to continue as an outpatient.  3. Charcot-Marie-Tooth.  Neurology was consulted to see if she needed      any more supportive treatment for her problem.  It had been a long      time since she had seen Dr. Sandria Manly, who was her neurologist years      ago.  Dr. Thad Ranger saw her and recommended setting up a referral at      [facility] and followup there.  He also recommended TED hose      for her chronic edema but with her feet being so tender that was      not an option.  4. Cat scratch disease.  With the history of the scratch on her knees      and elevated lymphocytosis, patient was initially empirically      started on vanc and Zosyn, which was eventually changed to      azithromycin to cover her for her Bartonella henselae organism,     which is more common in cat scratch.  A wound culture  then grew      Morganella morganii and patient was put on Cipro for that and it      also grew MRSA and hence, doxy was added, so the final antibiotic      regimen for patient was doxy, Cipro, and metronidazole for her C.      diff colitis.  5. Microcytic anemia.  Her hemoglobin had been stable during the      entire admission, and on day of discharge it was 11.6.  Her      platelets came down to 404  This was mostly supposed to be      secondary to reactive thrombocytosis for her chronic comorbidities      plus an acute infection.   VITAL SIGNS ON DAY OF DISCHARGE:  Temperature 97, pulse 100, systolic  blood pressure 117/66, respiratory rate 20, saturating 95% to 98% on  room air.   DISCHARGE LABS:  Hemoglobin 11.6, white count 7.4, platelets 404.      Artist Beach, MD  Electronically Signed     SP/MEDQ  D:  02/18/2006  T:  02/19/2006  Job:  [id number]   cc:   Eliseo Gum, M.D.  Peggye Pitt, M.D.

## 2010-11-02 ENCOUNTER — Telehealth: Payer: Self-pay | Admitting: Internal Medicine

## 2010-11-02 NOTE — Telephone Encounter (Signed)
Chris at [facility] called She needs room air sat between feb 2012 thur now She trying to get pt life o2 for medcaid

## 2010-12-21 ENCOUNTER — Inpatient Hospital Stay: Payer: Self-pay | Admitting: Internal Medicine

## 2011-02-23 IMAGING — CT CT HEAD WITHOUT CONTRAST
2 of 4 series · 16 of 30 positions shown, 19 images · non-contrast
Comparison: none

REASON FOR EXAM: dysphasia
COMMENTS:

[Series 2: without · axial · non-contrast · 0.43mm/px · z∈[-175,-50]mm · 10 of 31 slices shown, 13 images]
[im 3/31  brain]
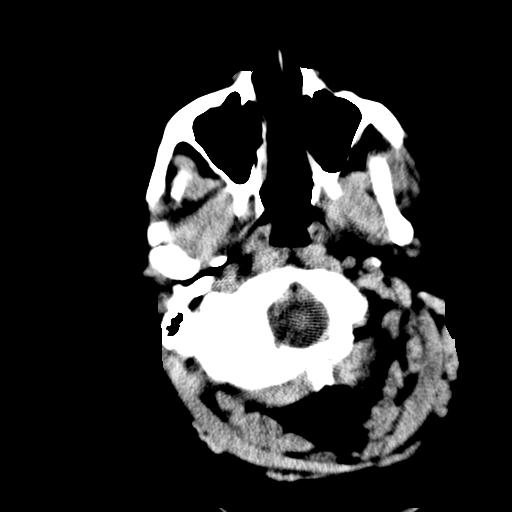
[im 3/31  bone]
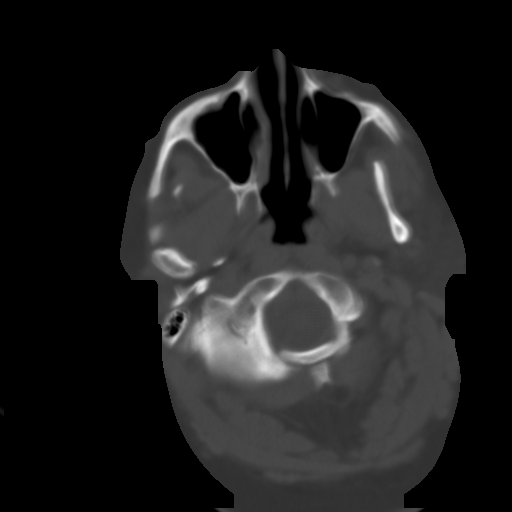
[im 6/31  brain]
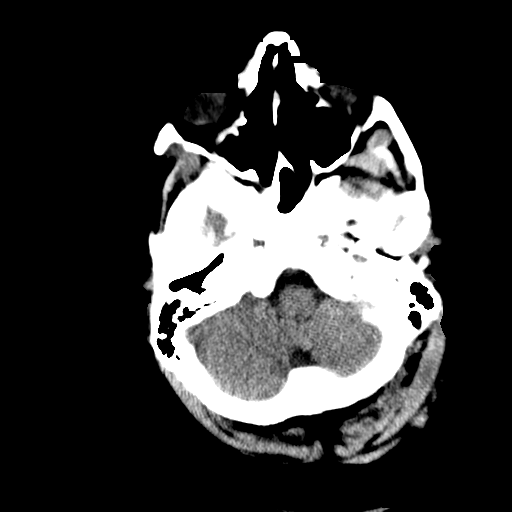
[im 9/31  brain]
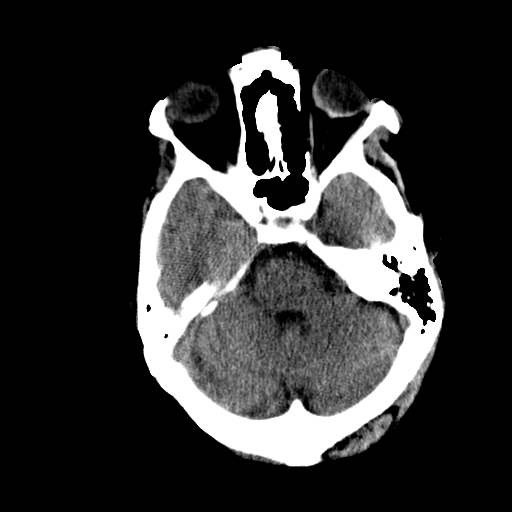
[im 11/31  brain]
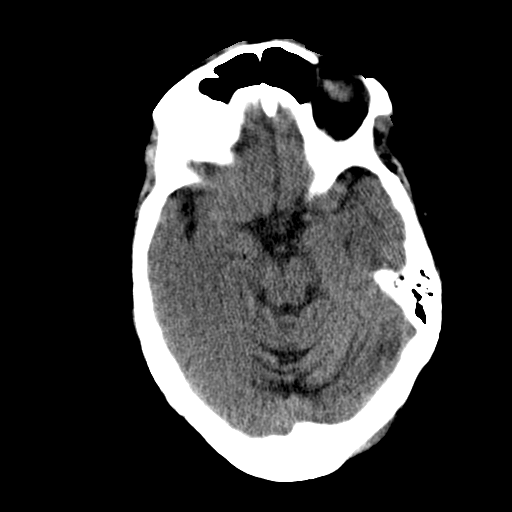
[im 14/31  brain]
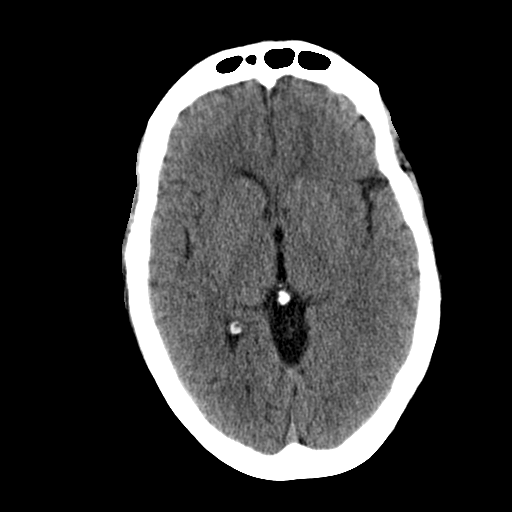
[im 14/31  bone]
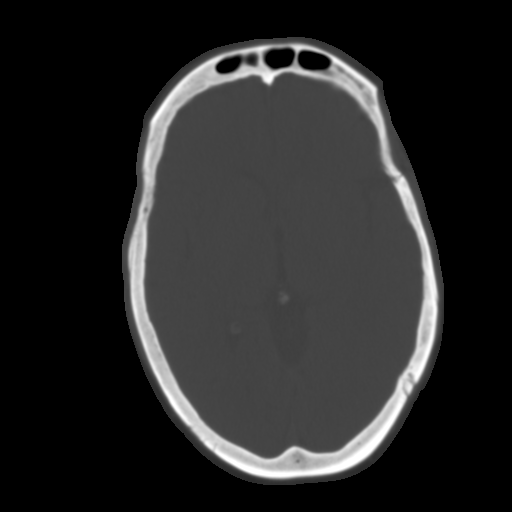
[im 17/31  brain]
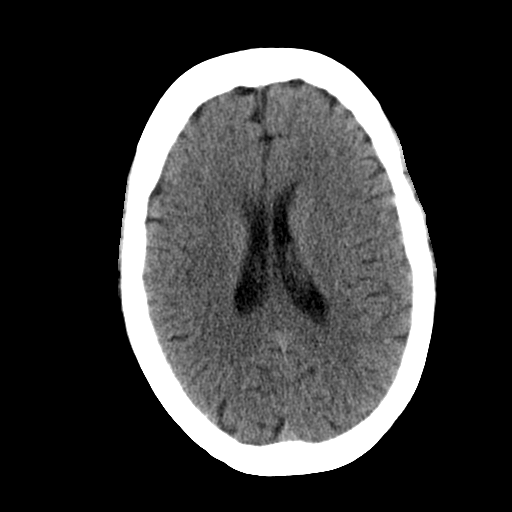
[im 20/31  brain]
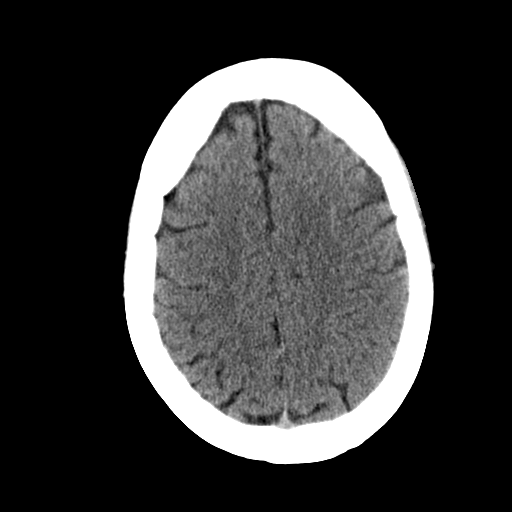
[im 22/31  brain]
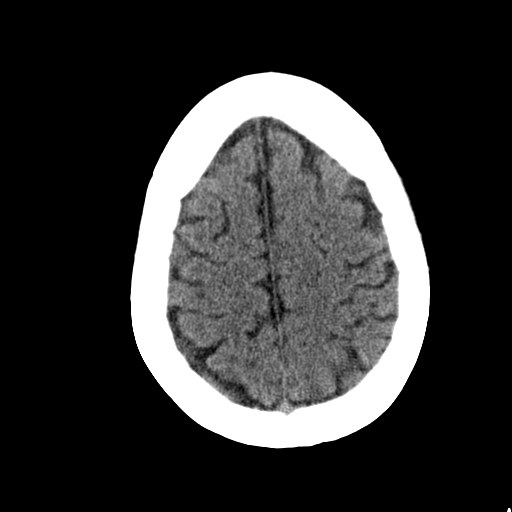
[im 25/31  brain]
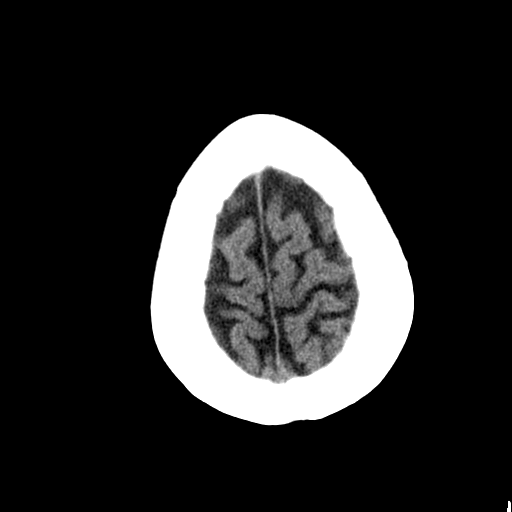
[im 25/31  bone]
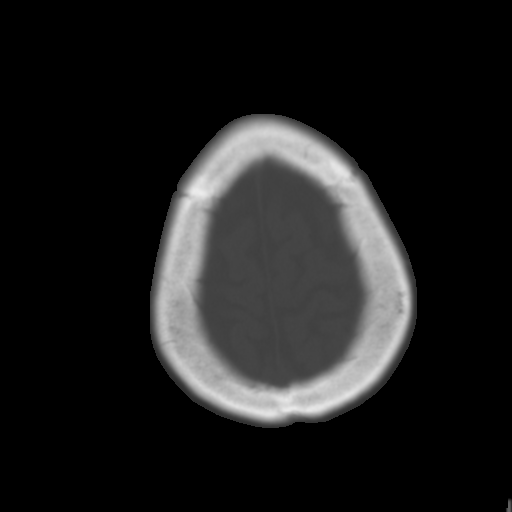
[im 28/31  brain]
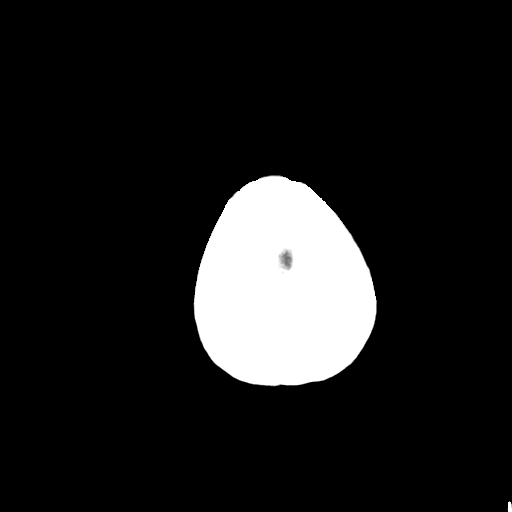

[Series 4: bone · axial · 0.43mm/px · z∈[-175,-90]mm · 6 of 31 slices shown]
[im 3/31  bone]
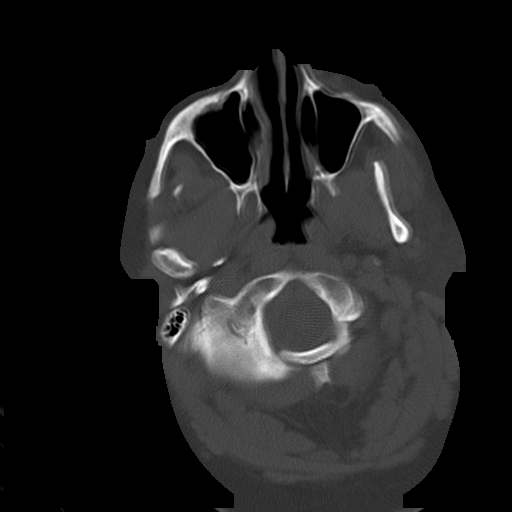
[im 6/31  bone]
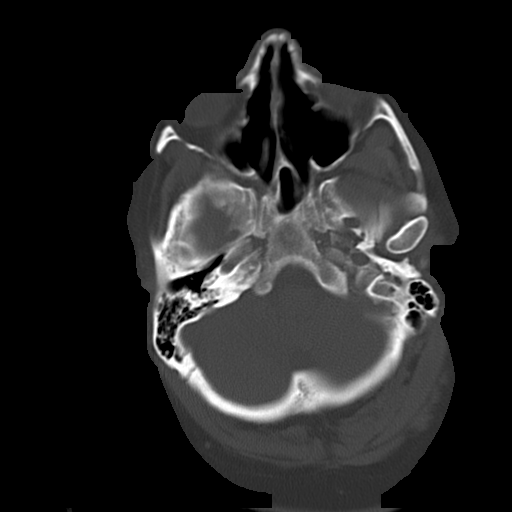
[im 11/31  bone]
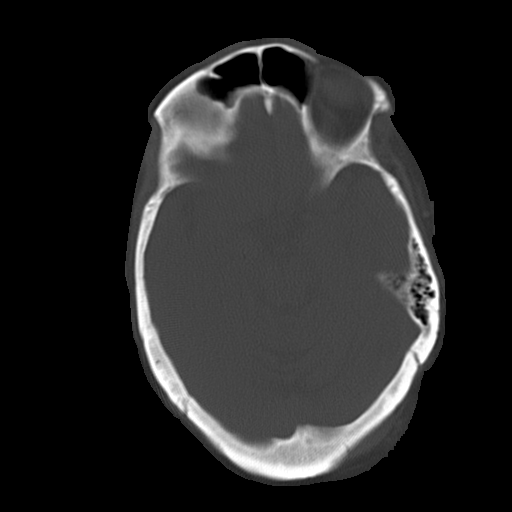
[im 14/31  bone]
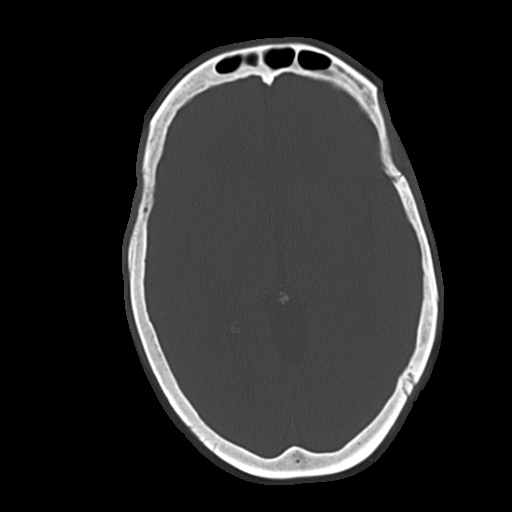
[im 17/31  bone]
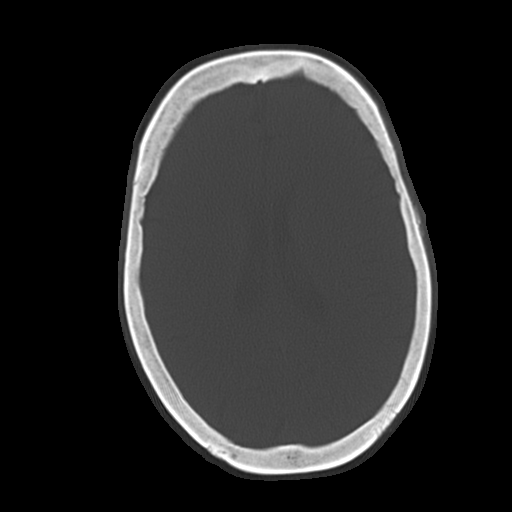
[im 20/31  bone]
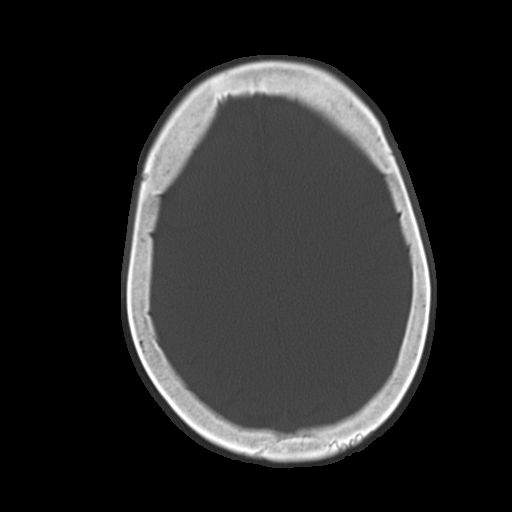

[16 of 30 positions shown; findings below may reference images not displayed]

PROCEDURE:     CT  - CT HEAD WITHOUT CONTRAST  - July 06, 2009 [DATE]

RESULT:     Axial noncontrast CT scanning was performed through the brain at
5 mm intervals and slice thicknesses. Comparison made to study 16 September, 2007.

The ventricles are normal in size and position. There is no intracranial
hemorrhage nor intracranial mass effect. The cerebellum and brainstem are
normal in density. There are no findings suspicious for an evolving ischemic
infarction. At bone window settings the observed portions of the paranasal
sinuses and mastoid air cells are clear.
IMPRESSION: I see no acute intracranial abnormality. Followup MRI may
be useful if the patient's clinical findings persist.

## 2011-08-24 ENCOUNTER — Emergency Department: Payer: Self-pay | Admitting: Emergency Medicine

## 2011-08-24 LAB — URINALYSIS, COMPLETE
Ph: 6 (ref 4.5–8.0)
Protein: NEGATIVE
RBC,UR: 2 /HPF (ref 0–5)
WBC UR: 114 /HPF (ref 0–5)

## 2011-08-24 LAB — COMPREHENSIVE METABOLIC PANEL
Bilirubin,Total: 0.6 mg/dL (ref 0.2–1.0)
Calcium, Total: 8.5 mg/dL (ref 8.5–10.1)
Chloride: 105 mmol/L (ref 98–107)
Co2: 27 mmol/L (ref 21–32)
EGFR (African American): >60
EGFR (Non-African Amer.): >60
SGOT(AST): 18 U/L (ref 15–37)
SGPT (ALT): 12 U/L

## 2011-08-24 LAB — CBC
HCT: 42.9 % (ref 35.0–47.0)
HGB: 14 g/dL (ref 12.0–16.0)
MCV: 83 fL (ref 80–100)
Platelet: 302 10*3/uL (ref 150–440)
RBC: 5.15 10*6/uL (ref 3.80–5.20)
WBC: 10.2 10*3/uL (ref 3.6–11.0)

## 2011-08-25 ENCOUNTER — Emergency Department: Payer: Self-pay | Admitting: Emergency Medicine

## 2011-08-26 ENCOUNTER — Inpatient Hospital Stay: Payer: Self-pay | Admitting: Internal Medicine

## 2011-08-26 LAB — COMPREHENSIVE METABOLIC PANEL
Anion Gap: 17 — ABNORMAL HIGH (ref 7–16)
Calcium, Total: 8.9 mg/dL (ref 8.5–10.1)
EGFR (African American): >60
EGFR (Non-African Amer.): >60
SGOT(AST): 35 U/L (ref 15–37)
Sodium: 142 mmol/L (ref 136–145)

## 2011-08-26 LAB — CBC WITH DIFFERENTIAL/PLATELET
Basophil %: 0.6 %
Eosinophil %: 0.4 %
HGB: 14.7 g/dL (ref 12.0–16.0)
MCH: 27 pg (ref 26.0–34.0)
Monocyte #: 0.4 x10 3/mm (ref 0.2–0.9)
Monocyte %: 4.1 %
Neutrophil %: 69.4 %
RBC: 5.45 10*6/uL — ABNORMAL HIGH (ref 3.80–5.20)
WBC: 10.4 10*3/uL (ref 3.6–11.0)

## 2011-08-26 LAB — URINE CULTURE

## 2011-08-27 LAB — CBC WITH DIFFERENTIAL/PLATELET
Eosinophil #: 0.1 10*3/uL (ref 0.0–0.7)
Eosinophil %: 1.1 %
HCT: 42.5 % (ref 35.0–47.0)
Lymphocyte #: 4.1 10*3/uL — ABNORMAL HIGH (ref 1.0–3.6)
Lymphocyte %: 41.3 %
MCV: 84 fL (ref 80–100)
Monocyte #: 0.6 x10 3/mm (ref 0.2–0.9)
Monocyte %: 5.7 %
Platelet: 340 10*3/uL (ref 150–440)
RBC: 5.1 10*6/uL (ref 3.80–5.20)
RDW: 14.1 % (ref 11.5–14.5)
WBC: 10 10*3/uL (ref 3.6–11.0)

## 2011-08-27 LAB — COMPREHENSIVE METABOLIC PANEL
Anion Gap: 15 (ref 7–16)
BUN: 3 mg/dL — ABNORMAL LOW (ref 7–18)
Bilirubin,Total: 0.8 mg/dL (ref 0.2–1.0)
Chloride: 107 mmol/L (ref 98–107)
Co2: 18 mmol/L — ABNORMAL LOW (ref 21–32)
Creatinine: 0.46 mg/dL — ABNORMAL LOW (ref 0.60–1.30)
EGFR (African American): >60
Potassium: 3.1 mmol/L — ABNORMAL LOW (ref 3.5–5.1)
SGOT(AST): 26 U/L (ref 15–37)
Sodium: 140 mmol/L (ref 136–145)
Total Protein: 7 g/dL (ref 6.4–8.2)

## 2011-08-29 LAB — BASIC METABOLIC PANEL
Anion Gap: 12 (ref 7–16)
Calcium, Total: 9.2 mg/dL (ref 8.5–10.1)
EGFR (Non-African Amer.): >60
Glucose: 152 mg/dL — ABNORMAL HIGH (ref 65–99)
Osmolality: 279 (ref 275–301)
Potassium: 3.7 mmol/L (ref 3.5–5.1)

## 2011-09-03 LAB — LIPASE, BLOOD: Lipase: 86 U/L (ref 73–393)

## 2011-09-03 LAB — CLOSTRIDIUM DIFFICILE BY PCR

## 2011-09-11 ENCOUNTER — Encounter: Payer: Self-pay | Admitting: Nurse Practitioner

## 2011-09-11 ENCOUNTER — Encounter: Payer: Self-pay | Admitting: Cardiothoracic Surgery

## 2011-09-19 ENCOUNTER — Encounter: Payer: Self-pay | Admitting: Cardiothoracic Surgery

## 2011-09-19 ENCOUNTER — Encounter: Payer: Self-pay | Admitting: Nurse Practitioner

## 2011-10-05 ENCOUNTER — Emergency Department: Payer: Self-pay | Admitting: Emergency Medicine

## 2011-10-05 LAB — COMPREHENSIVE METABOLIC PANEL
Albumin: 3.7 g/dL (ref 3.4–5.0)
Anion Gap: 16 (ref 7–16)
BUN: 10 mg/dL (ref 7–18)
Calcium, Total: 9.1 mg/dL (ref 8.5–10.1)
Chloride: 102 mmol/L (ref 98–107)
EGFR (African American): >60
Glucose: 87 mg/dL (ref 65–99)
Potassium: 3 mmol/L — ABNORMAL LOW (ref 3.5–5.1)
SGOT(AST): 37 U/L (ref 15–37)
SGPT (ALT): 42 U/L (ref 12–78)
Total Protein: 7.5 g/dL (ref 6.4–8.2)

## 2011-10-05 LAB — URINALYSIS, COMPLETE
Bacteria: NONE SEEN
Glucose,UR: NEGATIVE mg/dL (ref 0–75)
RBC,UR: 1 /HPF (ref 0–5)
Specific Gravity: 1.011 (ref 1.003–1.030)
Squamous Epithelial: <1
WBC UR: 4 /HPF (ref 0–5)

## 2011-10-05 LAB — DRUG SCREEN, URINE
Barbiturates, Ur Screen: NEGATIVE (ref ?–200)
Benzodiazepine, Ur Scrn: NEGATIVE (ref ?–200)
Cannabinoid 50 Ng, Ur ~~LOC~~: NEGATIVE (ref ?–50)
MDMA (Ecstasy)Ur Screen: NEGATIVE (ref ?–500)

## 2011-10-05 LAB — CBC
HCT: 39.2 % (ref 35.0–47.0)
MCH: 26.5 pg (ref 26.0–34.0)
MCV: 80 fL (ref 80–100)
Platelet: 350 10*3/uL (ref 150–440)
RBC: 4.9 10*6/uL (ref 3.80–5.20)
RDW: 13.7 % (ref 11.5–14.5)

## 2011-10-05 LAB — ETHANOL: Ethanol: <3 mg/dL

## 2011-10-06 ENCOUNTER — Inpatient Hospital Stay: Payer: Self-pay | Admitting: Specialist

## 2011-10-06 LAB — URINALYSIS, COMPLETE
Leukocyte Esterase: NEGATIVE
Nitrite: NEGATIVE
Ph: 6 (ref 4.5–8.0)
Protein: NEGATIVE
RBC,UR: <1 /HPF (ref 0–5)
Squamous Epithelial: <1

## 2011-10-06 LAB — CBC
HGB: 12 g/dL (ref 12.0–16.0)
MCHC: 32.3 g/dL (ref 32.0–36.0)
MCV: 80 fL (ref 80–100)
RBC: 4.65 10*6/uL (ref 3.80–5.20)
RDW: 13.5 % (ref 11.5–14.5)

## 2011-10-06 LAB — COMPREHENSIVE METABOLIC PANEL
Albumin: 3.6 g/dL (ref 3.4–5.0)
Alkaline Phosphatase: 110 U/L (ref 50–136)
Calcium, Total: 9 mg/dL (ref 8.5–10.1)
SGOT(AST): 37 U/L (ref 15–37)
SGPT (ALT): 46 U/L (ref 12–78)

## 2011-10-07 LAB — VANCOMYCIN, TROUGH: Vancomycin, Trough: 23 ug/mL (ref 10–20)

## 2011-10-09 LAB — WOUND AEROBIC CULTURE

## 2011-10-10 LAB — CREATININE, SERUM
Creatinine: 0.73 mg/dL (ref 0.60–1.30)
EGFR (Non-African Amer.): >60

## 2011-10-11 LAB — CULTURE, BLOOD (SINGLE)

## 2011-10-11 LAB — PLATELET COUNT: Platelet: 354 10*3/uL (ref 150–440)

## 2011-10-12 ENCOUNTER — Emergency Department: Payer: Self-pay | Admitting: Emergency Medicine

## 2011-10-12 LAB — CK: CK, Total: 53 U/L (ref 21–215)

## 2011-10-12 LAB — COMPREHENSIVE METABOLIC PANEL
Albumin: 3.1 g/dL — ABNORMAL LOW (ref 3.4–5.0)
Alkaline Phosphatase: 116 U/L (ref 50–136)
Bilirubin,Total: 0.5 mg/dL (ref 0.2–1.0)
Calcium, Total: 8.9 mg/dL (ref 8.5–10.1)
Co2: 26 mmol/L (ref 21–32)
Creatinine: 0.63 mg/dL (ref 0.60–1.30)
EGFR (African American): >60
EGFR (Non-African Amer.): >60
Osmolality: 282 (ref 275–301)
SGOT(AST): 42 U/L — ABNORMAL HIGH (ref 15–37)
SGPT (ALT): 57 U/L (ref 12–78)
Sodium: 143 mmol/L (ref 136–145)

## 2011-10-12 LAB — CBC
HCT: 35.2 % (ref 35.0–47.0)
HGB: 11.5 g/dL — ABNORMAL LOW (ref 12.0–16.0)
MCH: 26.7 pg (ref 26.0–34.0)
RBC: 4.31 10*6/uL (ref 3.80–5.20)

## 2011-10-12 LAB — ETHANOL
Ethanol %: <0.003 % (ref 0.000–0.080)
Ethanol: <3 mg/dL

## 2012-03-02 ENCOUNTER — Inpatient Hospital Stay: Payer: Self-pay | Admitting: Internal Medicine

## 2012-03-02 LAB — CBC WITH DIFFERENTIAL/PLATELET
Basophil %: 0.6 %
Eosinophil: 2 %
Lymphocyte #: 3.6 10*3/uL (ref 1.0–3.6)
Lymphocyte %: 34 %
Lymphocytes: 37 %
MCHC: 32.7 g/dL (ref 32.0–36.0)
MCV: 82 fL (ref 80–100)
Monocyte #: 0.4 x10 3/mm (ref 0.2–0.9)
Monocyte %: 3.9 %
Monocytes: 5 %
Neutrophil #: 6.4 10*3/uL (ref 1.4–6.5)
Neutrophil %: 60.3 %
Platelet: 336 10*3/uL (ref 150–440)
Segmented Neutrophils: 54 %
Variant Lymphocyte - H1-Rlymph: 2 %
WBC: 10.6 10*3/uL (ref 3.6–11.0)

## 2012-03-02 LAB — BASIC METABOLIC PANEL
BUN: 11 mg/dL (ref 7–18)
Calcium, Total: 9.1 mg/dL (ref 8.5–10.1)
Chloride: 107 mmol/L (ref 98–107)
Co2: 27 mmol/L (ref 21–32)
EGFR (African American): >60
Osmolality: 281 (ref 275–301)
Potassium: 3.5 mmol/L (ref 3.5–5.1)
Sodium: 142 mmol/L (ref 136–145)

## 2012-03-03 LAB — CBC WITH DIFFERENTIAL/PLATELET
Basophil #: 0 10*3/uL (ref 0.0–0.1)
Basophil %: 0.6 %
Eosinophil #: 0.1 10*3/uL (ref 0.0–0.7)
HGB: 10.6 g/dL — ABNORMAL LOW (ref 12.0–16.0)
Lymphocyte %: 36.1 %
MCH: 27.4 pg (ref 26.0–34.0)
MCHC: 33.3 g/dL (ref 32.0–36.0)
MCV: 82 fL (ref 80–100)
Monocyte #: 0.4 x10 3/mm (ref 0.2–0.9)
Monocyte %: 4.9 %
Neutrophil %: 56.9 %
RBC: 3.87 10*6/uL (ref 3.80–5.20)
RDW: 13.9 % (ref 11.5–14.5)

## 2012-03-03 LAB — BASIC METABOLIC PANEL
Calcium, Total: 8.2 mg/dL — ABNORMAL LOW (ref 8.5–10.1)
Chloride: 106 mmol/L (ref 98–107)
Co2: 27 mmol/L (ref 21–32)
Creatinine: 0.46 mg/dL — ABNORMAL LOW (ref 0.60–1.30)
EGFR (Non-African Amer.): >60
Glucose: 72 mg/dL (ref 65–99)
Potassium: 3.8 mmol/L (ref 3.5–5.1)

## 2012-03-05 LAB — CREATININE, SERUM: EGFR (African American): >60

## 2012-03-05 LAB — VANCOMYCIN, TROUGH: Vancomycin, Trough: 8 ug/mL — ABNORMAL LOW (ref 10–20)

## 2012-03-08 LAB — CULTURE, BLOOD (SINGLE)

## 2012-04-10 ENCOUNTER — Emergency Department: Payer: Self-pay | Admitting: Emergency Medicine

## 2012-05-28 ENCOUNTER — Emergency Department: Payer: Self-pay | Admitting: Emergency Medicine

## 2012-05-28 LAB — COMPREHENSIVE METABOLIC PANEL
Albumin: 3.1 g/dL — ABNORMAL LOW (ref 3.4–5.0)
BUN: 13 mg/dL (ref 7–18)
Bilirubin,Total: 0.2 mg/dL (ref 0.2–1.0)
Co2: 29 mmol/L (ref 21–32)
EGFR (Non-African Amer.): >60
Glucose: 71 mg/dL (ref 65–99)
Osmolality: 274 (ref 275–301)
Potassium: 3.9 mmol/L (ref 3.5–5.1)
SGOT(AST): 17 U/L (ref 15–37)
SGPT (ALT): 25 U/L (ref 12–78)
Sodium: 138 mmol/L (ref 136–145)
Total Protein: 6.5 g/dL (ref 6.4–8.2)

## 2012-05-28 LAB — APTT: Activated PTT: 35.4 secs (ref 23.6–35.9)

## 2012-05-28 LAB — CBC WITH DIFFERENTIAL/PLATELET
Basophil %: 1 %
Eosinophil #: 0.1 10*3/uL (ref 0.0–0.7)
Eosinophil %: 1.7 %
HCT: 40 % (ref 35.0–47.0)
HGB: 12.9 g/dL (ref 12.0–16.0)
MCH: 26.6 pg (ref 26.0–34.0)
MCHC: 32.2 g/dL (ref 32.0–36.0)
Monocyte #: 0.4 x10 3/mm (ref 0.2–0.9)
Monocyte %: 5.1 %
Neutrophil %: 57.4 %
Platelet: 325 10*3/uL (ref 150–440)
WBC: 7.9 10*3/uL (ref 3.6–11.0)

## 2012-05-28 LAB — PROTIME-INR
INR: 1
Prothrombin Time: 13.1 secs (ref 11.5–14.7)

## 2012-06-28 ENCOUNTER — Emergency Department: Payer: Self-pay | Admitting: Emergency Medicine

## 2012-07-02 ENCOUNTER — Emergency Department: Payer: Self-pay | Admitting: Emergency Medicine

## 2012-07-02 LAB — COMPREHENSIVE METABOLIC PANEL
Albumin: 3.2 g/dL — ABNORMAL LOW (ref 3.4–5.0)
Alkaline Phosphatase: 82 U/L (ref 50–136)
Co2: 28 mmol/L (ref 21–32)
EGFR (Non-African Amer.): >60
Osmolality: 282 (ref 275–301)
Potassium: 3.6 mmol/L (ref 3.5–5.1)
SGOT(AST): 13 U/L — ABNORMAL LOW (ref 15–37)
SGPT (ALT): 17 U/L (ref 12–78)
Sodium: 141 mmol/L (ref 136–145)
Total Protein: 6.7 g/dL (ref 6.4–8.2)

## 2012-07-02 LAB — CBC
HCT: 36.4 % (ref 35.0–47.0)
MCH: 26.7 pg (ref 26.0–34.0)
MCHC: 33.2 g/dL (ref 32.0–36.0)
RBC: 4.53 10*6/uL (ref 3.80–5.20)
RDW: 14.4 % (ref 11.5–14.5)
WBC: 9.1 10*3/uL (ref 3.6–11.0)

## 2012-07-07 ENCOUNTER — Emergency Department: Payer: Self-pay | Admitting: Unknown Physician Specialty

## 2012-07-07 LAB — CBC WITH DIFFERENTIAL/PLATELET
Basophil %: 0.9 %
Eosinophil #: 0.2 10*3/uL (ref 0.0–0.7)
Eosinophil %: 2.6 %
Lymphocyte #: 3.9 10*3/uL — ABNORMAL HIGH (ref 1.0–3.6)
Lymphocyte %: 44.2 %
MCH: 26.8 pg (ref 26.0–34.0)
MCHC: 33.3 g/dL (ref 32.0–36.0)
MCV: 81 fL (ref 80–100)
Monocyte #: 0.3 x10 3/mm (ref 0.2–0.9)
Monocyte %: 3.7 %
Neutrophil #: 4.3 10*3/uL (ref 1.4–6.5)
Neutrophil %: 48.6 %
Platelet: 340 10*3/uL (ref 150–440)

## 2012-07-07 LAB — COMPREHENSIVE METABOLIC PANEL
Alkaline Phosphatase: 83 U/L (ref 50–136)
Anion Gap: 6 — ABNORMAL LOW (ref 7–16)
Co2: 28 mmol/L (ref 21–32)
Creatinine: 0.41 mg/dL — ABNORMAL LOW (ref 0.60–1.30)
EGFR (Non-African Amer.): >60
Glucose: 78 mg/dL (ref 65–99)
Osmolality: 277 (ref 275–301)
Potassium: 3.7 mmol/L (ref 3.5–5.1)
Sodium: 139 mmol/L (ref 136–145)

## 2012-07-11 LAB — WOUND CULTURE

## 2012-07-12 LAB — CULTURE, BLOOD (SINGLE)

## 2012-08-18 ENCOUNTER — Emergency Department: Payer: Self-pay | Admitting: Emergency Medicine

## 2012-08-18 LAB — URINALYSIS, COMPLETE
Blood: NEGATIVE
Glucose,UR: NEGATIVE mg/dL (ref 0–75)
Ph: 5 (ref 4.5–8.0)
Protein: NEGATIVE
Specific Gravity: 1.01 (ref 1.003–1.030)

## 2012-08-18 LAB — COMPREHENSIVE METABOLIC PANEL
Albumin: 3.3 g/dL — ABNORMAL LOW (ref 3.4–5.0)
Alkaline Phosphatase: 89 U/L (ref 50–136)
Anion Gap: 5 — ABNORMAL LOW (ref 7–16)
Bilirubin,Total: 0.3 mg/dL (ref 0.2–1.0)
Calcium, Total: 8.4 mg/dL — ABNORMAL LOW (ref 8.5–10.1)
Chloride: 109 mmol/L — ABNORMAL HIGH (ref 98–107)
Creatinine: 0.68 mg/dL (ref 0.60–1.30)
Glucose: 95 mg/dL (ref 65–99)
Osmolality: 283 (ref 275–301)
Sodium: 141 mmol/L (ref 136–145)
Total Protein: 6.8 g/dL (ref 6.4–8.2)

## 2012-08-18 LAB — ETHANOL
Ethanol %: <0.003 % (ref 0.000–0.080)
Ethanol: <3 mg/dL

## 2012-08-18 LAB — DRUG SCREEN, URINE
Amphetamines, Ur Screen: NEGATIVE (ref ?–1000)
Barbiturates, Ur Screen: NEGATIVE (ref ?–200)
Benzodiazepine, Ur Scrn: NEGATIVE (ref ?–200)
Cannabinoid 50 Ng, Ur ~~LOC~~: NEGATIVE (ref ?–50)
Cocaine Metabolite,Ur ~~LOC~~: NEGATIVE (ref ?–300)
MDMA (Ecstasy)Ur Screen: NEGATIVE (ref ?–500)
Methadone, Ur Screen: NEGATIVE (ref ?–300)
Opiate, Ur Screen: NEGATIVE (ref ?–300)
Tricyclic, Ur Screen: NEGATIVE (ref ?–1000)

## 2012-08-18 LAB — TSH: Thyroid Stimulating Horm: 2.87 u[IU]/mL

## 2012-08-18 LAB — CBC
HCT: 40.5 % (ref 35.0–47.0)
MCH: 27.2 pg (ref 26.0–34.0)

## 2012-08-20 LAB — URINE CULTURE

## 2013-03-11 ENCOUNTER — Inpatient Hospital Stay: Payer: Self-pay | Admitting: Internal Medicine

## 2013-03-11 LAB — CBC
HCT: 46.9 % (ref 35.0–47.0)
HGB: 15.5 g/dL (ref 12.0–16.0)
MCH: 27.2 pg (ref 26.0–34.0)
MCHC: 33.1 g/dL (ref 32.0–36.0)
MCV: 82 fL (ref 80–100)
Platelet: 494 10*3/uL — ABNORMAL HIGH (ref 150–440)
RBC: 5.72 10*6/uL — ABNORMAL HIGH (ref 3.80–5.20)
RDW: 14.7 % — ABNORMAL HIGH (ref 11.5–14.5)
WBC: 13.6 10*3/uL — AB (ref 3.6–11.0)

## 2013-03-11 LAB — COMPREHENSIVE METABOLIC PANEL
ALK PHOS: 131 U/L — AB
Albumin: 3.5 g/dL (ref 3.4–5.0)
Anion Gap: 5 — ABNORMAL LOW (ref 7–16)
BUN: 8 mg/dL (ref 7–18)
Bilirubin,Total: 0.6 mg/dL (ref 0.2–1.0)
Calcium, Total: 9 mg/dL (ref 8.5–10.1)
Chloride: 102 mmol/L (ref 98–107)
Co2: 28 mmol/L (ref 21–32)
Creatinine: 0.49 mg/dL — ABNORMAL LOW (ref 0.60–1.30)
EGFR (African American): >60
GLUCOSE: 87 mg/dL (ref 65–99)
Osmolality: 268 (ref 275–301)
Potassium: 3.5 mmol/L (ref 3.5–5.1)
SGOT(AST): 25 U/L (ref 15–37)
SGPT (ALT): 15 U/L (ref 12–78)
Sodium: 135 mmol/L — ABNORMAL LOW (ref 136–145)
Total Protein: 7.7 g/dL (ref 6.4–8.2)

## 2013-03-11 LAB — URINALYSIS, COMPLETE
BILIRUBIN, UR: NEGATIVE
Blood: NEGATIVE
GLUCOSE, UR: NEGATIVE mg/dL (ref 0–75)
Ketone: NEGATIVE
Nitrite: POSITIVE
Ph: 7 (ref 4.5–8.0)
Protein: NEGATIVE
RBC, UR: NONE SEEN /HPF (ref 0–5)
Specific Gravity: 1.006 (ref 1.003–1.030)
Squamous Epithelial: 1

## 2013-03-11 LAB — TROPONIN I

## 2013-03-12 LAB — BASIC METABOLIC PANEL
Anion Gap: 4 — ABNORMAL LOW (ref 7–16)
BUN: 8 mg/dL (ref 7–18)
CHLORIDE: 112 mmol/L — AB (ref 98–107)
Calcium, Total: 7.9 mg/dL — ABNORMAL LOW (ref 8.5–10.1)
Co2: 27 mmol/L (ref 21–32)
Creatinine: 0.47 mg/dL — ABNORMAL LOW (ref 0.60–1.30)
Glucose: 90 mg/dL (ref 65–99)
OSMOLALITY: 283 (ref 275–301)
Potassium: 3 mmol/L — ABNORMAL LOW (ref 3.5–5.1)
SODIUM: 143 mmol/L (ref 136–145)

## 2013-03-12 LAB — CBC WITH DIFFERENTIAL/PLATELET
Basophil #: 0.1 10*3/uL (ref 0.0–0.1)
Basophil %: 0.8 %
Eosinophil #: 0.3 10*3/uL (ref 0.0–0.7)
Eosinophil %: 3.4 %
HCT: 36.4 % (ref 35.0–47.0)
HGB: 12.1 g/dL (ref 12.0–16.0)
LYMPHS ABS: 3.7 10*3/uL — AB (ref 1.0–3.6)
Lymphocyte %: 40.9 %
MCH: 27 pg (ref 26.0–34.0)
MCHC: 33.1 g/dL (ref 32.0–36.0)
MCV: 82 fL (ref 80–100)
Monocyte #: 0.5 x10 3/mm (ref 0.2–0.9)
Monocyte %: 6 %
NEUTROS ABS: 4.4 10*3/uL (ref 1.4–6.5)
Neutrophil %: 48.9 %
PLATELETS: 300 10*3/uL (ref 150–440)
RBC: 4.46 10*6/uL (ref 3.80–5.20)
RDW: 14.4 % (ref 11.5–14.5)
WBC: 9 10*3/uL (ref 3.6–11.0)

## 2013-03-12 LAB — MAGNESIUM: MAGNESIUM: 1.7 mg/dL — AB

## 2013-03-12 LAB — PRO B NATRIURETIC PEPTIDE: B-Type Natriuretic Peptide: 221 pg/mL — ABNORMAL HIGH (ref 0–125)

## 2013-03-13 LAB — CBC WITH DIFFERENTIAL/PLATELET
BASOS PCT: 0.7 %
Basophil #: 0.1 10*3/uL (ref 0.0–0.1)
Eosinophil #: 0.2 10*3/uL (ref 0.0–0.7)
Eosinophil %: 3.1 %
HCT: 34.8 % — AB (ref 35.0–47.0)
HGB: 11.4 g/dL — AB (ref 12.0–16.0)
Lymphocyte #: 3.5 10*3/uL (ref 1.0–3.6)
Lymphocyte %: 48.7 %
MCH: 27.2 pg (ref 26.0–34.0)
MCHC: 32.8 g/dL (ref 32.0–36.0)
MCV: 83 fL (ref 80–100)
MONOS PCT: 4.5 %
Monocyte #: 0.3 x10 3/mm (ref 0.2–0.9)
Neutrophil #: 3.1 10*3/uL (ref 1.4–6.5)
Neutrophil %: 43 %
Platelet: 284 10*3/uL (ref 150–440)
RBC: 4.2 10*6/uL (ref 3.80–5.20)
RDW: 14.3 % (ref 11.5–14.5)
WBC: 7.2 10*3/uL (ref 3.6–11.0)

## 2013-03-13 LAB — BASIC METABOLIC PANEL
ANION GAP: 4 — AB (ref 7–16)
BUN: 9 mg/dL (ref 7–18)
CALCIUM: 8.3 mg/dL — AB (ref 8.5–10.1)
CHLORIDE: 113 mmol/L — AB (ref 98–107)
CO2: 24 mmol/L (ref 21–32)
CREATININE: 0.45 mg/dL — AB (ref 0.60–1.30)
EGFR (African American): >60
EGFR (Non-African Amer.): >60
GLUCOSE: 91 mg/dL (ref 65–99)
OSMOLALITY: 280 (ref 275–301)
Potassium: 3.3 mmol/L — ABNORMAL LOW (ref 3.5–5.1)
Sodium: 141 mmol/L (ref 136–145)

## 2013-03-13 LAB — MAGNESIUM: Magnesium: 1.8 mg/dL

## 2013-03-13 LAB — URINE CULTURE

## 2013-03-14 LAB — PLATELET COUNT: Platelet: 280 10*3/uL (ref 150–440)

## 2013-03-16 LAB — CULTURE, BLOOD (SINGLE)

## 2013-03-19 LAB — PLATELET COUNT: Platelet: 304 10*3/uL (ref 150–440)

## 2013-05-31 IMAGING — CR DG CHEST 1V PORT
1 series · 1 of 1 positions shown · non-contrast
Comparison: none

REASON FOR EXAM: fever, tachycardia, alttered mental status
COMMENTS:

PROCEDURE:     DXR - DXR PORTABLE CHEST SINGLE VIEW  - October 12, 2011  [DATE]
RESULT:     Lungs clear. Heart normal. Chest stable from 10/05/2011.

[portable]
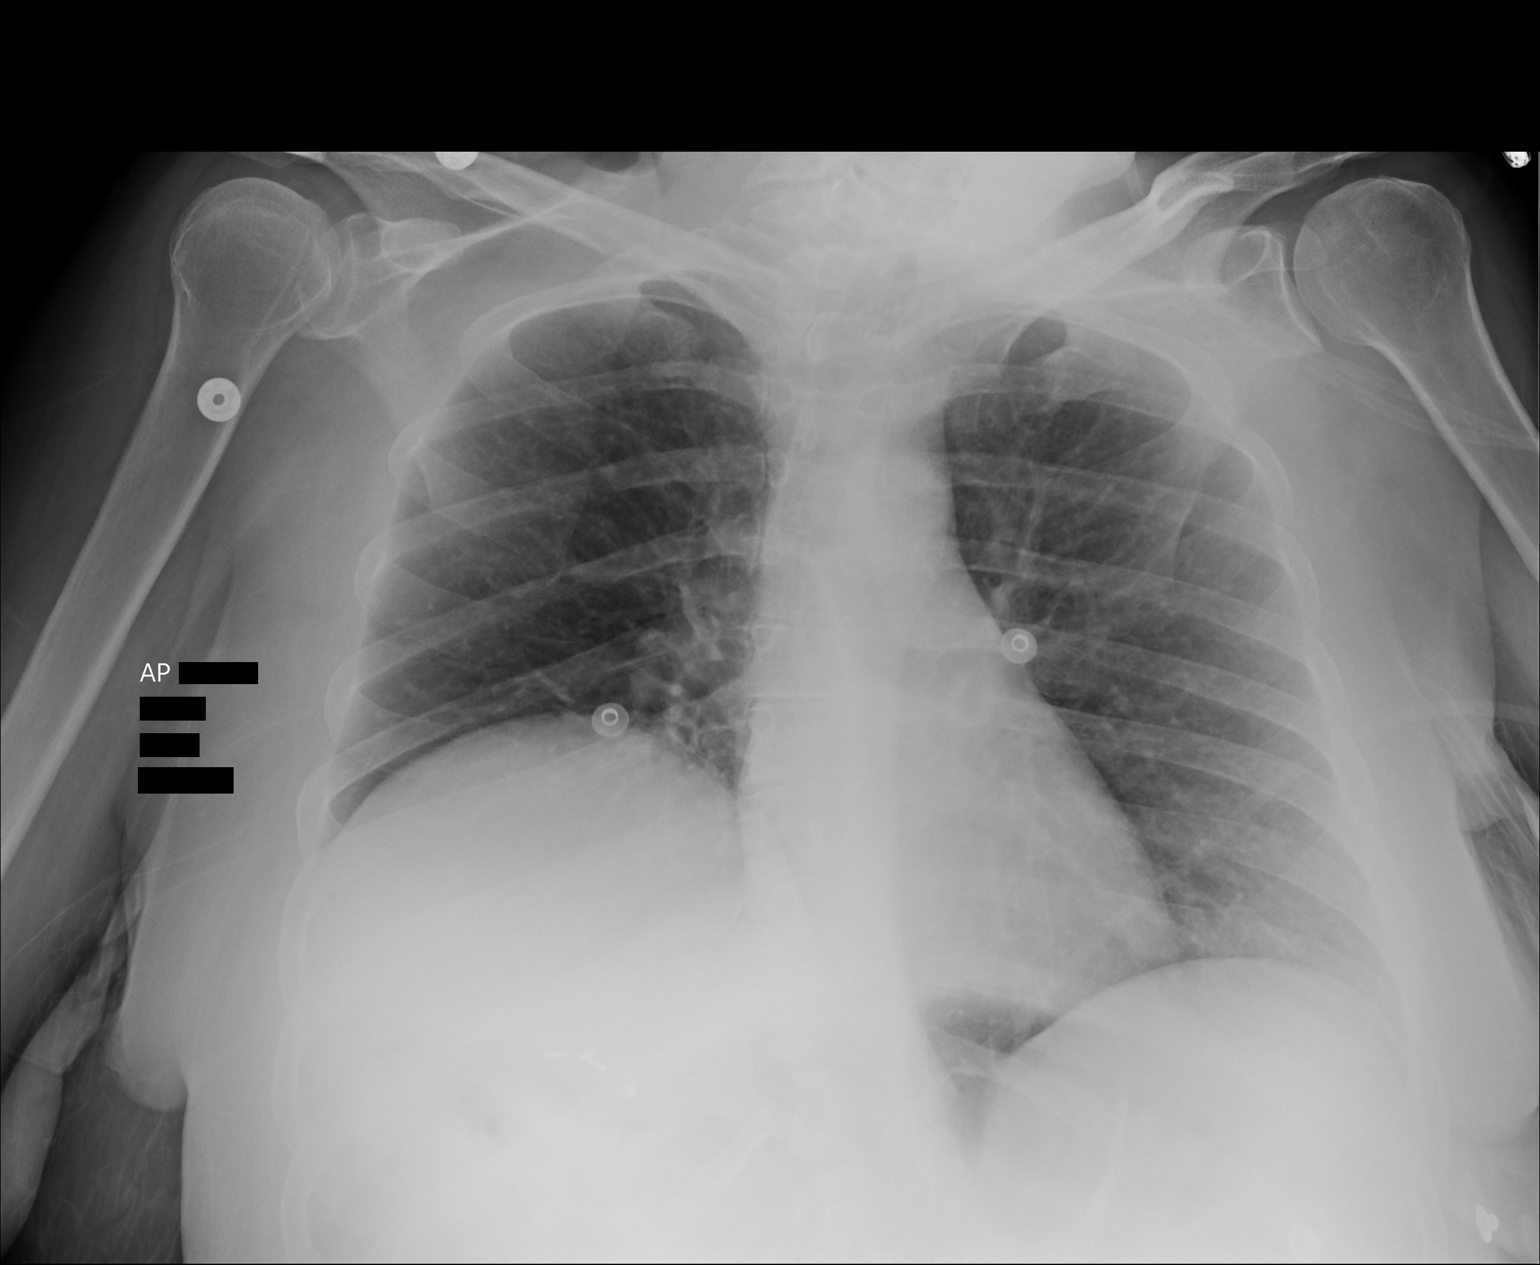

[1 of 1 positions shown; findings below may reference images not displayed]

IMPRESSION: No acute abnormality.

## 2013-10-25 ENCOUNTER — Other Ambulatory Visit: Payer: Self-pay | Admitting: Family Medicine

## 2013-10-25 LAB — URINALYSIS, COMPLETE
BLOOD: NEGATIVE
Bilirubin,UR: NEGATIVE
Glucose,UR: NEGATIVE mg/dL (ref 0–75)
Ketone: NEGATIVE
Nitrite: NEGATIVE
Ph: 6 (ref 4.5–8.0)
Protein: NEGATIVE
Specific Gravity: 1.005 (ref 1.003–1.030)
Squamous Epithelial: 1

## 2013-10-28 LAB — URINE CULTURE

## 2014-01-15 IMAGING — US US EXTREM LOW VENOUS*R*
1 series · 14 of 24 positions shown · non-contrast
Comparison: none

REASON FOR EXAM: rle pain
COMMENTS:

PROCEDURE:     US  - US DOPPLER LOW EXTR RIGHT  - May 28, 2012  [DATE]
RESULT:     History: Right knee pain.
Comparison Study: No recent.

[Series 1: us extrem low venous*right* · 0.09mm/px · 14 of 32 slices shown]
[im 1/32]
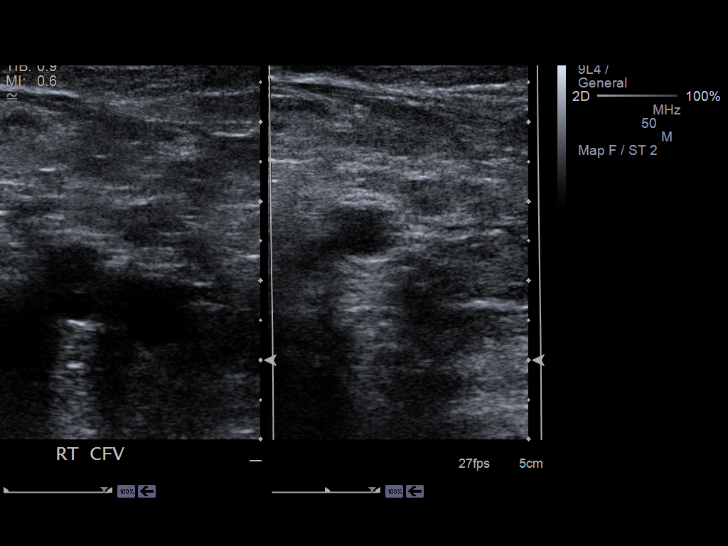
[im 3/32]
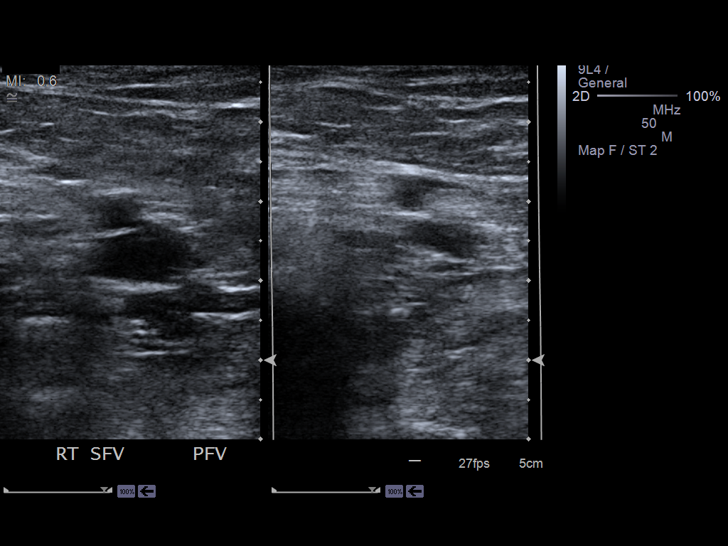
[im 6/32]
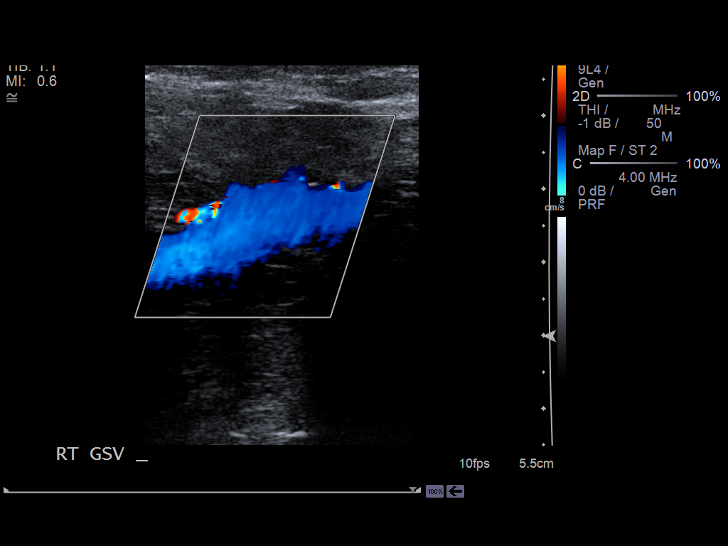
[im 9/32]
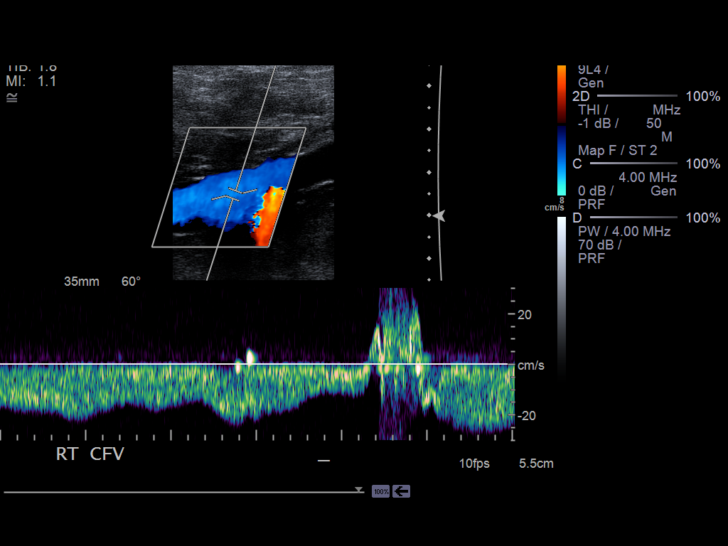
[im 10/32]
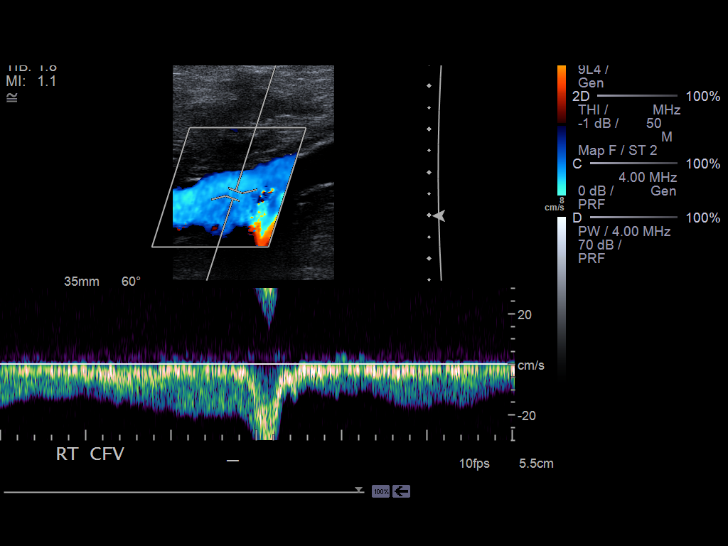
[im 13/32]
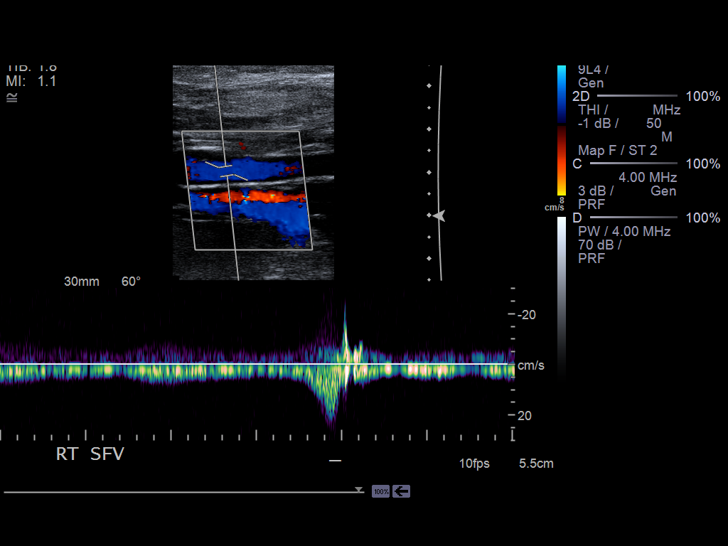
[im 15/32]
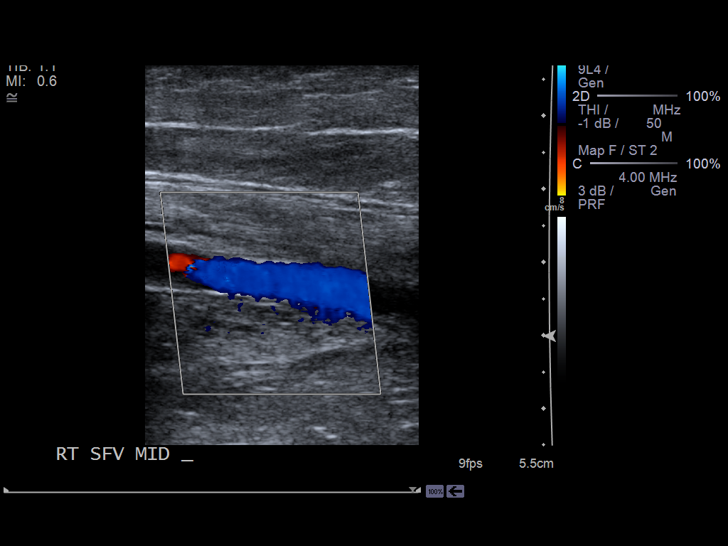
[im 17/32]
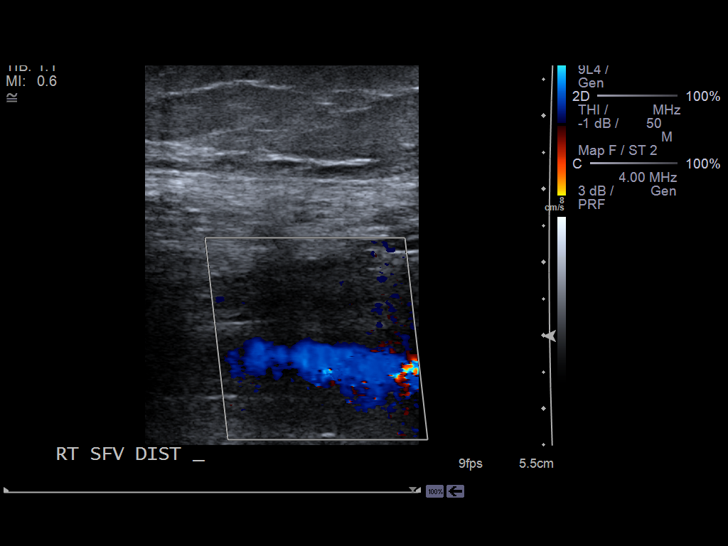
[im 19/32]
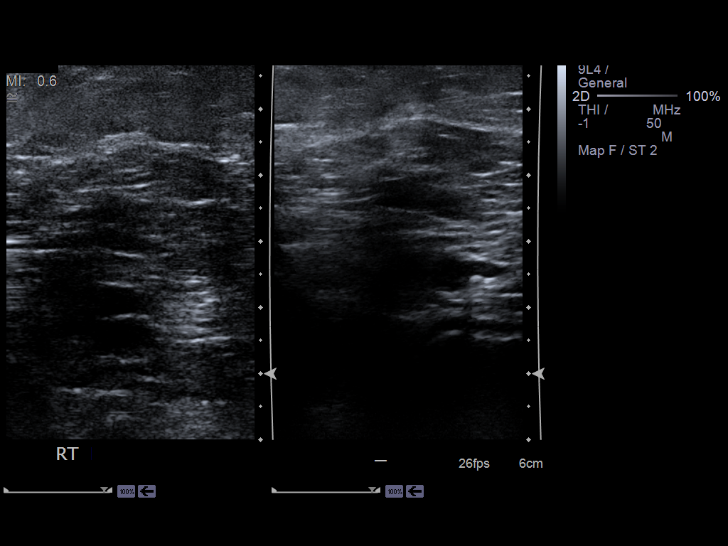
[im 22/32]
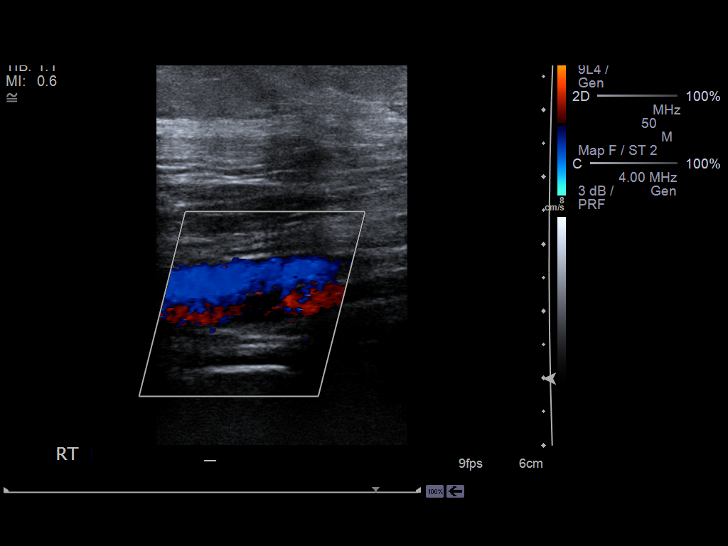
[im 25/32]
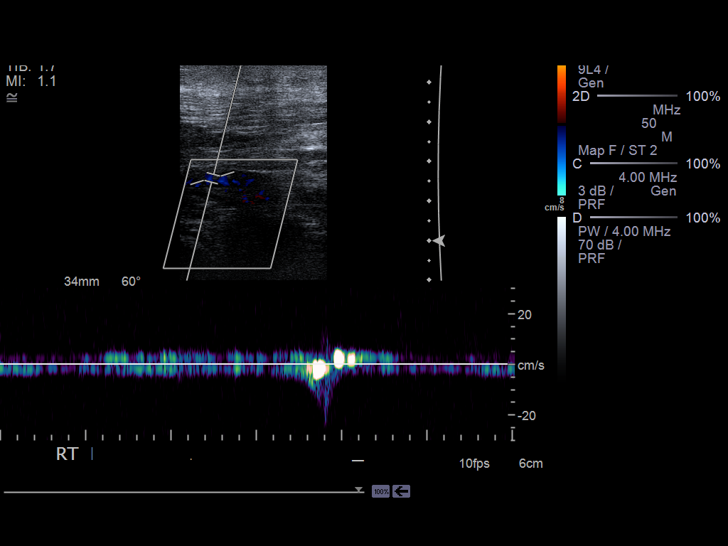
[im 26/32]
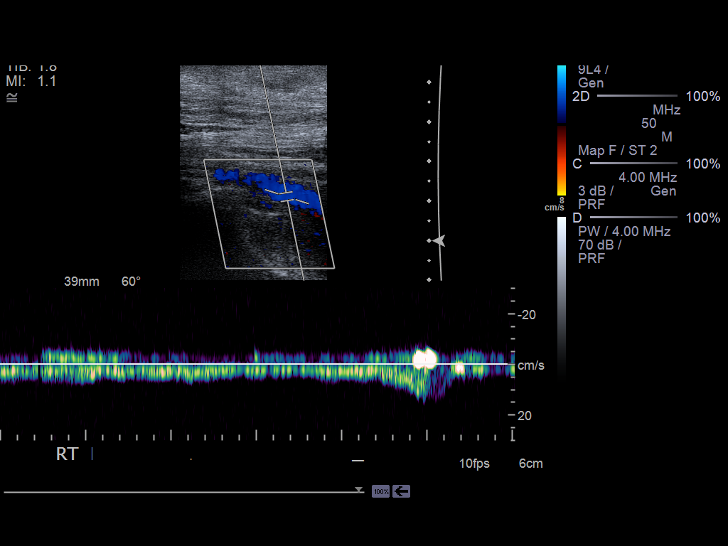
[im 29/32]
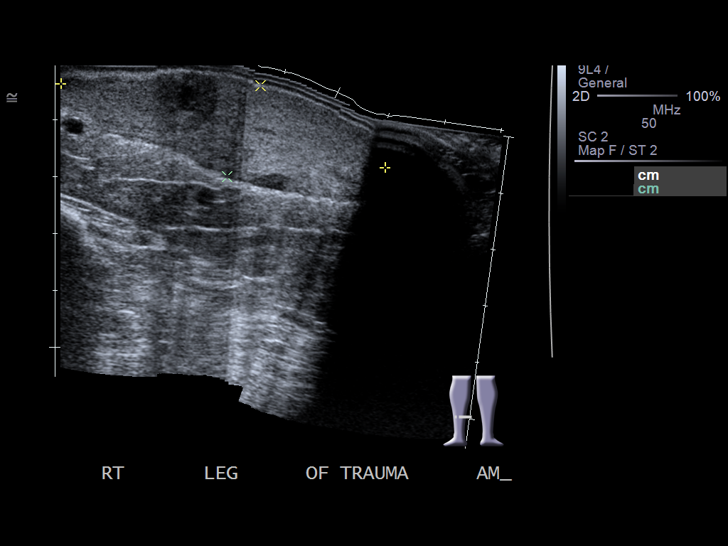
[im 32/32]
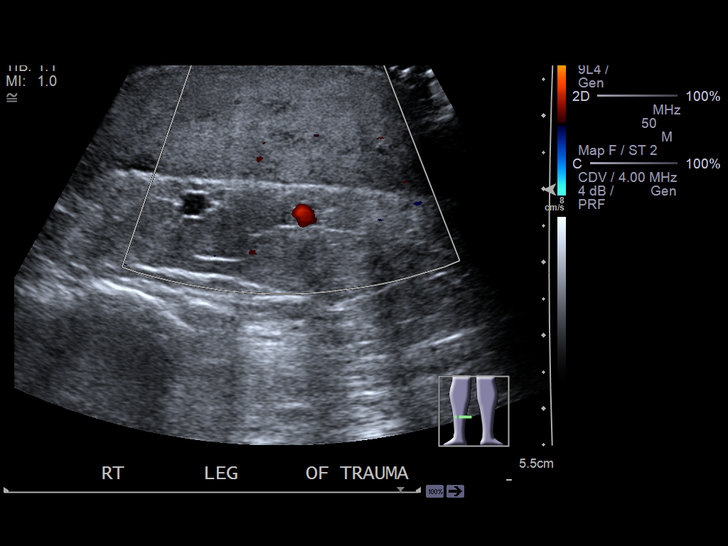

[14 of 24 positions shown; findings below may reference images not displayed]

FINDINGS: Standard color flow duplex right lower extremity obtained. No
evidence of deep venous thrombosis. Soft tissue density is present in the
medial right lower extremity at site of prior trauma. This is most likely
hematoma. Maximum diameter 6.3 cm.
IMPRESSION: No DVT. Probable hematoma right lower extremity soft
tissues.

## 2014-04-07 ENCOUNTER — Ambulatory Visit: Payer: Self-pay

## 2014-04-09 ENCOUNTER — Ambulatory Visit: Payer: Self-pay

## 2014-04-11 ENCOUNTER — Other Ambulatory Visit: Payer: Self-pay

## 2014-04-13 ENCOUNTER — Other Ambulatory Visit: Payer: Self-pay

## 2014-04-14 ENCOUNTER — Other Ambulatory Visit: Payer: Self-pay

## 2014-04-14 ENCOUNTER — Other Ambulatory Visit: Payer: Self-pay | Admitting: Family Medicine

## 2014-06-07 NOTE — Discharge Summary (Signed)
PATIENT NAME:  Melanie Berry, Melanie Berry MR#:  [id number] DATE OF BIRTH:  [birth year]  DATE OF ADMISSION:  10/06/2011 DATE OF DISCHARGE:  10/11/2011  PRIMARY CARE PHYSICIAN: Corky Downs, MD  DISCHARGE DIAGNOSES:  1. Right lower extremity hematoma.  2. Right lower extremity chronic ulcer.  3. Dehydration.  4. Chronic obstructive pulmonary disease.  5. Anxiety/severe depression.  6. Chronic lymphedema. 7. Hypokalemia.  8. General debility.   CONSULTANTS:  1. Kristine Linea, MD - Psychiatry. 2. Tiney Rouge, MD - Surgery.  3. Wound Team.  ADMITTING HISTORY AND PHYSICAL: Please see the detailed history and physical dictated on 10/06/2011. In brief, this is a 49 year old Caucasian female patient with history of chronic lymphedema, Charcot-Marie-Tooth syndrome, and chronic ulceration of the right lower extremity for six months who presented to the Emergency Room complaining of redness, worsening ulcer, and right lower extremity swelling. The patient had previously fallen the day prior and had a right lower extremity hematoma along with discoloration and what seemed like cellulitis. The patient was admitted for possible cellulitis and sepsis.   HOSPITAL COURSE:  1. Right lower extremity hematoma. The patient was started on antibiotics for possible sepsis and cellulitis, but the discoloration was found to be secondary to hematoma and the ulcer was thought to be chronic. The patient did not have any fever, white count was in the normal range and antibiotics were discontinued. The patient did have her right lower extremity hematoma drained, although the patient refused it initially. With this her right lower extremity swelling has resolved and the patient was continued on wound care for her chronic ulcer.  2. General debility and home safety. The patient did not feel safe at home and did not have enough care from her husband and with bedbound state and minimal mobility the patient decided to be transferred to  an assisted living facility with home health for physical therapy and wound care.  3. Depression. The patient was seen by psychiatry for her severe depression as the patient was tearful multiple times, but did not have any suicidal thoughts. The patient was continued on her Topamax and Effexor for her depression.   On the day of discharge, the patient's blood pressure is 126/82 with heart rate of 91, temperature 98, and saturating 98% on room air. The patient is being discharged home in a stable condition. The patient does have chronic ulcer in the right lower extremity which is dressed along with the hematoma drained area with dry dressing.   DISCHARGE MEDICATIONS:  1. MiraLax once a day as needed for constipation.  2. Potassium chloride 40 mEq oral twice a day. 3. Percocet 10/325 mg one tablet orally every six hours as needed for pain.  4. Topamax 25 mg orally once a day.  5. Effexor XR 75 mg 3 capsules oral once a day.  6. Imodium AD 2 mg 1 tablet oral every four hours as needed.  7. Ventolin HFA 2 puffs inhaled four times daily as needed.  8. Advair Diskus 500/50 one puff inhaled twice a day. 9. Furosemide 40 mg 1 tablet oral twice a day. 10. Spiriva 18 mcg inhaled once a day.  11. Ferrous sulfate 325 mg oral once a day.  12. Singulair 10 mg oral once a day.  13. Potassium chloride 40 mEq oral twice a day.  14. Cyclobenzaprine/Flexeril 10 mg orally three times daily. 15. Omeprazole 20 mg oral once a day.  16. Acetaminophen 325 mg 2 tablets oral every four hours as needed for pain  or fever.   DISCHARGE INSTRUCTIONS: Transfer to assisted living facility with home health for physical therapy and nursing care for wound care. The patient will be on activity as tolerated with assistance at all times to prevent falls. She will follow up with her primary care physician within a week. She will be on a cardiac diet. The patient is to return to the Emergency Room if she has any fever, shortness of  breath or worsening in any way. This plan was discussed with the patient who has verbalized understanding and is okay with the plan.   TIME SPENT: Time spent today on this discharge dictation along with coordinating care and counseling of the patient on the day of discharge was 40 minutes. ____________________________ Molinda Bailiff Tallulah Hosman, MD srs:slb D: 10/11/2011 11:32:53 ET T: 10/11/2011 11:40:22 ET JOB#: [id number]  cc: Wardell Heath R. Merikay Lesniewski, MD, <Dictator> Corky Downs, MD Orie Fisherman MD ELECTRONICALLY SIGNED 10/18/2011 5:35

## 2014-06-07 NOTE — Consult Note (Signed)
Brief Consult Note: Diagnosis: Delirium secondary to medical condition resolving, Depression NOS.   Patient was seen by consultant.   Consult note dictated.   Discussed with Attending MD.   Comments: Ms. Sether has a h/o depression. She has been maintained on a combination of Effexor, Clonazepam, ambien and Topamax. He was admitted to medical floor for RLE ulceration with cellulitis and sepsis. She has a h/o treatment noncompliance with treatment refusals and AMA discharges.   Yesterday the patient was confused, delirious and uncooperative. She is much more pleasant and forthcoming with information today. She is still somewhat confused and keeps changing her story but seems much more cooperative and agreable with treatment plan.    PLAN: 1. The patient does not have the capacity to make medical decisions.   2. Please address treatable conditions underlying delirium.   3. Please continue Effexor, Clonazepam, ambien and Topamax as prescribed by her providers if possible. If patient refuses medications, they are probably the least important meds in her current situation.   4. The patient was provided with information about [facility] and Crisis center. She believes that her husband could use help.  4. I will follow up.  Electronic Signatures: Kristine Linea (MD)  (Signed 21-Aug-13 21:54)  Authored: Brief Consult Note   Last Updated: 21-Aug-13 21:54 by Kristine Linea (MD)

## 2014-06-07 NOTE — Consult Note (Signed)
Brief Consult Note: Diagnosis: Delirium secondary to medical condition resolved, Depression NOS.   Patient was seen by consultant.   Consult note dictated.   Recommend further assessment or treatment.   Orders entered.   Discussed with Attending MD.   Comments: Ms. Klundt has a h/o depression. She has been maintained on a combination of Effexor, Clonazepam, ambien and Topamax. He was admitted to medical floor for RLE ulceration with cellulitis and sepsis. She developed symptoms of deliruium. This has resolved with treatment of underlying medical condition.   MSE: Alert, oriented, pleasant, cooperative. Slightly tearful when talking about her troubles. Mood anxious as she was talking on the phone with assisted living facility owner. No psychotic symptoms. No safety issues. Average fund of knowlege. Good insight and judgement.     PLAN: 1. The patient is fit to make medical decisions.    2. Please discharge as appropriate.   3. Please continue Effexor, Clonazepam, ambien and Topamax as prescribed by her providers.   4. The patient was provided with information about local mental health provides and Crisis center.   5. I will sign off.  Electronic Signatures: Kristine Linea (MD)  (Signed [phone] 18:23)  Authored: Brief Consult Note   Last Updated: 22-Aug-13 18:23 by Kristine Linea (MD)
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
## 2014-06-07 NOTE — Discharge Summary (Signed)
PATIENT NAME:  Melanie Berry, FREKING MR#:  [id number] DATE OF BIRTH:  October 12, 1965  DATE OF ADMISSION:  08/26/2011 DATE OF DISCHARGE:  09/05/2011  ADDENDUM TO PREVIOUSLY DICTATED DISCHARGE SUMMARY BY DR. MODY ON 08/30/2011.   Since then patient has been waiting for [facility] number and placement. [facility] number arrived today and patient has been accepted at local nursing facility where she is being discharged in stable condition. She has finished her antibiotic course with oral Keflex.   DISCHARGE DIAGNOSES: Her discharge diagnoses have remained same with:  1. Right shin ulcer/cellulitis.  2. Klebsiella urinary tract infection which are treated.   CONSULTATIONS AND PROCEDURES: Remained the same along with laboratory data as dictated by Dr. Juliene Pina.   LABORATORY, DIAGNOSTIC AND RADIOLOGICAL DATA: 09/03/2011 abdominal 3-way with PA chest showed possible ileus. Stomach mildly distended with air.   Stool culture was negative.   Stool for C. difficile were negative.    HISTORY AND SHORT HOSPITAL COURSE: Patient is a 49 year old female with above-mentioned medical problems was admitted for right shin ulcer/cellulitis. She was also found to have Klebsiella urinary tract infection. Please see Dr. Camillia Herter dictated discharge summary for further details. Please also see Dr. Judithann Sheen' dictated history and physical for further details on admission. Subsequent to 08/30/2011 after Dr. Camillia Herter dictated discharge summary patient had some nausea, vomiting, diarrhea and her stool studies were negative. She was made n.p.o. for a day or two and was improving. Patient wanted to eat and was started back on a diet and has been tolerating it well. Her stool C. difficile was also negative. She felt Flexeril might be contributing to it but she was doing better anyway and is being discharged to rehab facility in stable condition. On the date of discharge she was close to her baseline and was getting regular dressing changes on her right shin.  Please last progress note for further details.   DISCHARGE MEDICATIONS: All same as dictated by Dr. Juliene Pina except stopping Keflex as she has finished her course of Keflex.   DISCHARGE INSTRUCTIONS: Patient will need follow up with Dr. Wyn Quaker in 4 to 6 weeks, with her primary care physician at the facility in 1 to 2 weeks and wound care clinic in 2 to 4 weeks. She will get physical therapy evaluation and management while at the facility.   TOTAL TIME DISCHARGING THIS PATIENT: 55 minutes.   ____________________________ Ellamae Sia. Sherryll Burger, MD vss:cms D: 09/05/2011 15:54:15 ET T: 09/05/2011 16:14:55 ET JOB#: [id number]  cc: Kyona Chauncey S. Sherryll Burger, MD, <Dictator> Lyndon Code, MD [facility] Annice Needy, MD [facility] Ellamae Sia [facility] MD ELECTRONICALLY SIGNED 09/05/2011 22:48
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
## 2014-06-07 NOTE — Consult Note (Signed)
PATIENT NAME:  Melanie Berry, Melanie Berry MR#:  [id number] DATE OF BIRTH:  1966/01/05  DATE OF CONSULTATION:  10/07/2011  REFERRING PHYSICIAN:   CONSULTING PHYSICIAN:  Claude Manges, MD  HISTORY OF PRESENT ILLNESS: Melanie Berry is a 49 year old white female who suffers from severe debilitating Charcot-Marie-Tooth syndrome and has been nonambulatory since November of 2012. She has been able to pivot some on her lower extremities and has had multiple visits to skilled nursing facilities and multiple care visits by the [facility] for her multiple lower extremity ulcers (most of which are healing). She was at home and fell off the couch and incurred a hematoma to her right lower extremity which necessitated her admission to [facility] yesterday.   PAST MEDICAL HISTORY:  1. Charcot-Marie-Tooth syndrome including severe debility of her hands bilaterally.  2. History of MRSA.  3. Chronic obstructive pulmonary disease/asthma.  4. Chronic lower extremity edema, cellulitis, and wounds.  5. Osteoporosis.  6. Gastroesophageal reflux disease.  7. Chronic neck and back pain.  8. Anxiety/depression.   ADMISSION MEDICATIONS: Advair Diskus, Ambien, Clonazepam, Effexor, ferrous sulfate, Flexeril, Lasix, Imodium, MiraLax, omeprazole, Percocet, potassium chloride, Singulair, Spiriva, Topamax, and Ventolin.   ALLERGIES: Iodine, seafood, Vicodin, sulfa drugs, Motrin, Biaxin, nortriptyline, Lyrica, aspirin, codeine, and tape.   SOCIAL HISTORY: The patient lives at home with her husband, but has no support from him as he is essentially bedbound. She has apparently many roaches in her home. She smoked cigarettes for 30 years and has not smoked over the last two weeks.   FAMILY HISTORY: Noncontributory.   REVIEW OF SYSTEMS: Negative for 10 systems, except as mentioned in the history of present illness, also some decreased urination, change in the color of her lower extremities, glove-type distribution neuropathy, insomnia,  and anxiety.   PHYSICAL EXAMINATION:   GENERAL: A pleasant elderly appearing female who appears much older than her known age lying comfortably in the hospital bed. Height is 5 feet 4 inches, weight 180 pounds, and BMI 30.9.   VITALS: Temperature 97.9, pulse 98, respirations 18, blood pressure 107/74, and oxygen saturation 99% on room air.   HEENT: Pupils are equally round and reactive to light. Extraocular movements intact. Sclerae anicteric. Oropharynx clear.   NECK: Supple with no tracheal deviation or jugular venous distention.   HEART: Regular rate and rhythm with no murmurs or rubs.   LUNGS: Clear to auscultation with normal respiratory effort bilaterally.   ABDOMEN: Soft, nontender, and nondistended with no palpable masses.   EXTREMITIES: The patient has significant bilateral lower extremity edema with discoloration and bruising of her left lower extremity as well as multiple wounds that are being actively treated by the wound care nurse here. The right lower extremity, in addition to also having discoloration, edema, and wounds, also has a large subcutaneous hematoma overlying the right anterior tibialis region that has some very thin possibly nonviable skin over top of it.   NEUROLOGIC: Sensation is decreased in bilateral lower extremities. The patient has significant bilateral hand contractures with generalized decreased motor strength.   PSYCHIATRIC: Alert and oriented x4. Appropriate affect.   LABS/RADIOLOGIC STUDIES: White blood cell count 12.6, normal hemoglobin, hematocrit, platelet count, urine drug screen, hepatic profile, and electrolytes.   Chest x-ray, tibia and fibula right x-rays, ultrasound and color flow Doppler of the right lower extremity are all normal.   ASSESSMENT AND RECOMMENDATIONS: Large subcutaneous hematoma of the right anterior tibialis region in an nonambulatory patient with debilitating Charcot-Marie-Tooth disease who also has multiple  lower extremity  wounds and a horrible social situation. I think surgical drainage would be the best chance of saving some of her overlying skin, but the patient is not willing to consent to surgery this evening and wishes to discuss this with Melanie Nutting, RN, who is our wound care nurse. She would also like a second opinion with Dr. Michela Pitcher who will assume her surgical care in the morning. She does not want to "make any hasty decisions" as she is fully "dependent on herself".  I have made her n.p.o. after midnight tonight in case she changes her mind.  ____________________________ Claude Manges, MD wfm:slb D: 10/07/2011 21:30:42 ET T: 10/08/2011 10:11:08 ET JOB#: [id number]  cc: Claude Manges, MD, <Dictator> Claude Manges MD ELECTRONICALLY SIGNED 10/20/2011 11:17

## 2014-06-07 NOTE — Consult Note (Signed)
Brief Consult Note: Diagnosis: Delirium secondary to medical condition, Depression NOS.Marland Kitchen   Patient was seen by consultant.   Recommend further assessment or treatment.   Comments: Melanie Berry has a h/o depression. She has been maintained on a combination of Effexor, Clonazepam, ambien and Topamax. He was admitted to medical floor for RLE ulceration with cellulitis and sepsis. She has a h/o treatment noncompliance with treatment refusals and AMA discharges. I was asked today to evaluate her depression but the patient refused consultation. Apparently the consultation should have been for evaluation of her capacity to make medical decisions as she has been refusing treatment again.   During my assessment, the patient was confused and was unable to answer any of my qustions sensibly. She made statements like "I have a high school diploma and some college education and I do not need to see a psychiatrist" and "you need to see my husband who is on Paxil and alcohol". She was unable to explain to me why she was in the hospiyal and what kind of treatment she has been receiving.   PLAN: 1. The patient does not have the capacity to make medical decisions.   2. Please address treatable conditions underlying delirium.   3. Please continue Effexor, Clonazepam, ambien and Topamax as prescribed by her providers if possible. If patient refuses medications, they are probably the least important meds in her current situation.   4. I will follow up..  Electronic Signatures: Melanie Berry (MD)  (Signed 21-Aug-13 00:11)  Authored: Brief Consult Note   Last Updated: 21-Aug-13 00:11 by Melanie Berry (MD)

## 2014-06-07 NOTE — Consult Note (Signed)
Brief Consult Note: Diagnosis: large subcutaneous RLE hematoma.   Patient was seen by consultant.   Consult note dictated.   Comments: Debilitating Charcot disease; non-ambulatory since November 2012; many chronic LE wounds and SNF visits; horrible social situation; recent fall with hematoma over R anterior tibialis. I think surgical drainage might be the best chance of saving some of the overlying skin but patient not willing to consent to surgery until she discusses it with Glee Arvin, RN - wound care nurse. She would like a second opinion by Dr Michela Pitcher as well who will assume her surgical care in the AM. I have made her NPO after MN in case she changes her mind.  Electronic Signatures: Claude Manges (MD)  (Signed 19-Aug-13 21:20)  Authored: Brief Consult Note   Last Updated: 19-Aug-13 21:20 by Claude Manges (MD)

## 2014-06-10 NOTE — H&P (Signed)
PATIENT NAME:  Melanie Berry, DELAGARZA MR#:  [id number] DATE OF BIRTH:  04-May-1965  DATE OF ADMISSION:  03/02/2012  PRIMARY CARE PHYSICIAN: Dr. Lacie Scotts.   CHIEF COMPLAINT: Left lower extremity cellulitis and hematoma.   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 The patient says this morning she was in excruciating pain. She called the nursing assistant at [facility] due to severe pain. They  noticed a large hematoma on her left shin as well as a surrounding cellulitis.  She was brought to the ER for further evaluation. She had a CT scan, which showed a hematoma and cellulitis but no osteomyelitis or deeper fascia infection. The patient does not know how she developed this hematoma. She is not on anticoagulation. She is wheelchair-bound. She does not remember any trauma to this leg.   Review of systems:   CONSTITUTIONAL: No fever. Positive fatigue and weakness.  EYES: No blurred vision.  EARS, NOSE, AND THROAT: No ENT hearing loss, postnasal drip.  RESPIRATORY: No cough, wheezing, hemoptysis, or chronic obstructive pulmonary disease. CARDIOVASCULAR: No chest pain, palpitations, orthopnea, edema, syncope, arrhythmia, or dyspnea on exertion.  GASTROINTESTINAL: No nausea, vomiting, diarrhea, abdominal pain, melena, or ulcers. GENITOURINARY: No dysuria or hematuria.  ENDOCRINE: No polyuria or polydipsia.  HEMATOLOGIC/LYMPHATIC: Positive easy bruising.  SKIN: She has a large area of cellulitis on her left lower extremity with a large hematoma. Her right lower extremity, she has chronic wound care with a chronic ulcer.  MUSCULOSKELETAL: She has limited activity due to her Charcot-Marie-Tooth. She is wheelchair-bound.   NEUROLOGIC: No history of cerebrovascular accident or transient ischemic attack.  PSYCHIATRIC: Positive anxiety  or depression.   PAST MEDICAL HISTORY:  1.  Charcot-Marie-Tooth syndrome.  2.  Chronic lymphedema.  3.  History of methicillin-resistant Staphylococcus aureus.  4.  History of chronic obstructive pulmonary disease.  5.  History of chronic right lower extremity ulcer seen by wound care.  6.  Anxiety and depression.  7.  Chronic back and neck pain.   PAST SURGICAL HISTORY:  1.  Bilateral tubal ligation.  2.  Cholecystectomy.   MEDICATIONS:  1.  Phenytoin HFA 90 mcg 2 puffs q.i.d. p.r.n.  2.  Topamax 25 mg daily.  3.  Spiriva 18 mcg daily.  4. Singulair 10 mg daily.  5.  Risperdal 1 mg b.i.d.  6.  Potassium chloride 20 mEq b.i.d.  7.  Omeprazole 20 mg daily.  8.  MiraLAX 17 grams b.i.d.  9.  Imodium 1 tablet q.4 hours p.r.n.  10.  Lasix 40 mg b.i.d.   11.  Ferrous sulfate 325 mg daily.  12.  Effexor 75 mg 3 tablets daily.  13.  Cyclobenzaprine 10 mg t.i.d.  14.  Advair Diskus 500/250 1 puff b.i.d.  15.  Acetaminophen 325 two tablets q.4 hours p.r.n.   Allergy to iodine causes anaphylaxis; seafood causes shortness of breath; Vicodin and Darvocet cause nausea and vomiting; Sulfa, Motrin, and Biaxin caused gastrointestinal distress; nortriptyline causes hives; lyrica causes swelling; Neurontin causes retention of fluid; Aspirin, codeine and tape.   PAST SURGICAL HISTORY:  1.  Bilateral leg surgery.  2.  Tubal ligation.  3.  Sinus surgery.  4.  Cholecystectomy.   SOCIAL HISTORY: The patient is currently a resident of [facility]. No tobacco, alcohol, or drug use.   FAMILY HISTORY: Positive for hypertension and diabetes.  PHYSICAL EXAMINATION:  VITAL SIGNS: Temperature 98; pulse 81; respirations 18; blood pressure 105/77; 99% on room air.  GENERAL: The patient is alert, oriented, not in acute distress.  HEAD, EYES, EARS, NOSE, AND THROAT: Head is atraumatic, Pupils are round. Sclerae anicteric. Mucous membranes are moist. Oropharynx is clear.  NECK: Supple without  jugular venous distention, carotid bruit, or enlarged thyroid.  CARDIOVASCULAR: Regular rate and rhythm. No murmurs, gallops, or rubs. PMI is not displaced.  LUNGS: Clear to auscultation without crackles, rales, rhonchi, or wheezing. Normal percussion. This is heard anteriorly.  ABDOMEN: Bowel are positive, nontender, nondistended. No hepatosplenomegaly.  EXTREMITIES: Her left leg has 2+ edema.  Her right lower extremity hard to palpate a pulse. I have asked the nurse to please check pulses with Doppler. She has severe Charcot-Marie-Tooth in her lower extremities as well as upper extremities.  SKIN: She has a large area of cellulitis and a large hematoma on the left lower extremity and chronic right approximately 5 x 4 cm ulcer on the right shin. Cellulitis of the left leg extends all the way up to her knee but does not pass the knee.   NEUROLOGICAL: Cranial nerves 2 through 12 are grossly intact.   LABORATORIES: White blood cells 10.6, hemoglobin 12.5, hematocrit 38.1, platelets are 336. Sodium 142, potassium 3.5, chloride 107, bicarbonate 27, BUN 11, creatinine 0.55, glucose is 71.   CT of the tibia of the left leg shows extensive cellulitis and hemorrhage into the region as well. No soft tissue gas is evident.  No involvement of bone is demonstrated.   ASSESSMENT AND PLAN: A 49 year old female with a history of Charcot-Marie-Tooth syndrome, who has chronic right lower extremity wound seen by wound specialist, who now has left cellulitis with surrounding hematoma.  1.  Left lower extremity cellulitis with surrounding hematoma of unknown etiology. The patient will be placed on vancomycin. We will consult surgery to evaluate the hematoma as well as to rule out any kind of fasciitis. Will monitor the leg carefully.  2.  Charcot-Marie-Tooth syndrome: Will continue her outpatient medications.  3.  Anxiety and depression: Continue her outpatient medications, which include Effexor and Risperdal.  4.   History of chronic obstructive pulmonary disease: Continue her inhalers.  5.  The patient is a full CODE STATUS.   TIME SPENT: Approximately 45 minutes.    ____________________________ Janyth Contes. Juliene Pina, MD spm:th D: 03/02/2012 18:33:08 ET T: 03/02/2012 19:24:13 ET JOB#: [id number]  cc: Justen Fonda P. Juliene Pina, MD, <Dictator> Meindert A. Lacie Scotts, MD Janyth Contes Mads Borgmeyer MD ELECTRONICALLY SIGNED 03/02/2012 20:03

## 2014-06-10 NOTE — Discharge Summary (Signed)
PATIENT NAME:  Melanie Berry, Melanie Berry MR#:  [id number] DATE OF BIRTH:  October 11, 1965  DATE OF ADMISSION:  03/02/2012 DATE OF DISCHARGE:  03/05/2012  ADMITTING DIAGNOSIS:  Left lower extremity cellulitis and hematoma.   DISCHARGE DIAGNOSES:   1.  Left lower extremity hematoma status post evacuation with possible cellulitis.  2.  Right lower extremity wound felt to be due to chronic edema and Charcot-Marie-Tooth disease status post evaluation by a wound care nurse. Wound care orders have been given.  3.  History of chronic obstructive pulmonary disease.  4.  Depression/anxiety.  5.  Gastroesophageal reflux disease.  6.  Difficulty with ambulation due to her Charcot-Marie-Tooth syndrome.  7.  Chronic lymphedema.  8.  History of methicillin-resistant staphylococcus aureus.  9.  Chronic back pain and neck pain.  10.  Status post bilateral leg surgery.  11.  Status post tubal ligation.  12.  Status post sinus surgery.  13.  Status post cholecystectomy.   PERTINENT LABORATORY AND EVALUATIONS ON ADMISSION:  WBC count was 10.6, hemoglobin 12.5, platelet count was 336. Sodium was 142, potassium 3.5, chloride 107, bicarbonate 27, BUN 11, creatinine 0.55, glucose 71. CT scan of the tibia of the left leg showed extensive cellulitis and hemorrhage into the region as well. No soft tissue gas was evident. Most recent creatinine on January 16th was 0.63. Her hemoglobin on January 14th was 10.6 and platelet count was 294. Blood cultures no growth.   PROCEDURES:  Left lower extremity subcutaneous hematoma evacuation on 03/02/2012.   CONSULTANTS:  Dr. Egbert Garibaldi.   HOSPITAL COURSE:  Please refer to H and P done by the admitting physician for initial presentation. 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 She on the day of admission started  having pain in her leg. She called the nursing assistant at [facility] and did notice the large hematoma on her shin as well as surrounding erythema. She was brought to the ED. She had a CT scan which showed a hematoma and cellulitis. We were asked to admit the patient for further evaluation. She was seen in consultation by Dr. Egbert Garibaldi of surgery who went ahead and evacuated the hematoma. The patient tolerated the procedure without any complications. She was started on antibiotics, but it was felt that most of the erythema was due to the hematoma. She is continued on p.o. antibiotics. Her IV antibiotics have been discontinued. She has been afebrile. According to surgery, the wound seems to be doing well and they have given instructions for wound care. The patient also has a chronic right lower extremity ulcer for which Wound Care has seen the patient. She is doing much better and is currently stable for discharge back to assisted living. The patient was interested in going to a skilled nursing facility; however, she did not qualify for a skilled nursing facility according to the case manager.   DISCHARGE MEDICATIONS:  Tylenol 650 mg every 4 hours p.r.n. for pain or temperature greater than 100.4, Topamax 25 mg p.o. daily, Effexor-XR 75 mg p.o. daily, Imodium AD 2 mg 1 tab q. 4 hours p.r.n. for diarrhea, Ventolin 90 mcg aerosol 2 puffs 4 times per day as needed, Advair 500/50 mcg 1 puff b.i.d., Lasix 40 mg 1 tab p.o. b.i.d., cyclobenzaprine 10 mg 1 tab p.o. t.i.d., omeprazole 20 mg 1 tab daily, Spiriva 18 mcg daily, iron sulfate 325  mg 1 tab p.o. daily, Singulair 10 mg 1 tab p.o. daily, Risperdal 1 mg 1 tab p.o. b.i.d., MiraLax 17 grams 1 tab p.o. b.i.d. as needed, potassium chloride 20 mEq 2 tabs 2 times per day, acetaminophen/oxycodone 325/5 mg 1 tab p.o. q. 4 hours p.r.n. pain, nystatin powder apply to affected areas b.i.d., Colace 100 mg 1 tab p.o. b.i.d. as needed for constipation, Augmentin 1 tab p.o. q. 12  hours x 7 days.   HOME HEALTH SERVICE:  The patient is to resume home health at the assisted living. Dressing care once daily to the right lower extremity, wound cleaned with normal saline or _____ spray and pat dry, apply _____ gauze and cover with the Telfa pad, secure with 4 inch conform wrap and tape. For the left leg wound, daily care with petroleum or Adaptic dressing to the left suture line, 4 x 4, Kerlix, and light Ace compression.   DIET:  Low sodium, regular consistency.   ACTIVITY:  As tolerated, wheelchair as needed.   DISCHARGE INSTRUCTIONS:   1.  Follow up with Dr. Excell Seltzer in 1 to 2 weeks as scheduled.  2.  Follow up with Dr. Lacie Scotts in 1 to 2 weeks.   TIME SPENT:  35 minutes.    ____________________________ Lacie Scotts Allena Katz, MD shp:si D: 03/05/2012 17:14:52 ET T: 03/05/2012 20:15:31 ET JOB#: [id number]  cc: Verl Kitson H. Allena Katz, MD, <Dictator> Meindert A. Lacie Scotts, MD Charise Carwin MD ELECTRONICALLY SIGNED 03/09/2012 16:45

## 2014-06-10 NOTE — Consult Note (Signed)
PATIENT NAME:  Melanie Berry, JULSON MR#:  [id number] DATE OF BIRTH:  03/25/1965  DATE OF CONSULTATION:  03/02/2012  CONSULTING PHYSICIAN:  Loraine Leriche A. Egbert Garibaldi, MD  HISTORY: This is a 49 year old wheelchair-bound female with hereditary demyelinating syndrome, admitted to the medical service with left lower extremity cellulitis and subcutaneous hematoma. Surgical service has been consulted.   ALLERGIES: Well defined in the chart.   MEDICATIONS: Well defined in the chart.   PHYSICAL EXAMINATION: GENERAL: Patient is alert and oriented very chronically ill appearing much older than stated age.  EXTREMITIES:  Examination of her left lower extremity demonstrates a large anterior medial subcutaneous fluid collection, either sequestrated fluid or hematoma, given its ballotable appearance. I suspect this is more free fluid rather than hematoma. There is some mild surrounding cellulitis and ecchymosis. There are chronic wounds on the right lower extremity. She is missing several digits of her feet.  LABORATORY VALUES: White count 10.6, hemoglobin 12.5, platelet count 336,000. Electrolytes are unremarkable.   Review of CT scan is consistent with the above-noted findings. The area on CT scan appears to be just in the subcutaneous tissues.   IMPRESSION: Left lower extremity subcutaneous hematoma versus fluid collection.   RECOMMENDATIONS: Bedside incision and drainage under local anesthesia.      ____________________________ Redge Gainer. Egbert Garibaldi, MD mab:cc D: 03/02/2012 20:23:51 ET T: 03/02/2012 20:27:58 ET JOB#: [id number]  cc: Loraine Leriche A. Egbert Garibaldi, MD, <Dictator> Aminat Shelburne Kela Millin MD ELECTRONICALLY SIGNED 03/05/2012 22:31

## 2014-06-10 NOTE — Consult Note (Signed)
Brief Consult Note: Diagnosis: large left leg medial/anterior subcutaneous hematoma.   Patient was seen by consultant.   Consult note dictated.   Recommend to proceed with surgery or procedure.   Comments: should be able to tolerate bedside aspiration/I and D.  Electronic Signatures: Natale Lay (MD)  (Signed 13-Jan-14 20:16)  Authored: Brief Consult Note   Last Updated: 13-Jan-14 20:16 by Natale Lay (MD)

## 2014-06-10 NOTE — Consult Note (Signed)
PATIENT NAME:  Melanie Berry, Melanie Berry MR#:  [id number] DATE OF BIRTH:  Jul 12, 1965  DATE OF CONSULTATION:  05/28/2012  CONSULTING PHYSICIAN:  Cristal Deer A. Iretha Kirley, MD  REASON FOR CONSULTATION: Right lower extremity hematoma.   HISTORY OF PRESENT ILLNESS: The patient is a pleasant 49 year old female with history of lymphedema, COPD, Charcot-Marie-Tooth disease and who presents after a fall today after she was being taken care by the staff of her care home and they hit her leg with her left leg. She had a large bruise in the medial side of her leg. She has MS and CP. She has had multiple wounds in the past similar from similar issues, which have been debrided and opened and otherwise she is doing fine.  She has minimal sensation in her legs, but otherwise no fevers, chills, night sweats, shortness of breath, chest pain, nausea, vomiting, diarrhea, constipation, dysuria or hematuria.   PAST MEDICAL HISTORY:  1.  Muscular dystrophy.  2.  Dysphasia.  3.  Hyperkalemia.  4.  Lymphedema.  5.  Charcot-Marie-Tooth disease.  6.  COPD with exacerbation.  7.  Cellulitis.  8.  Osteoporosis.  9.  Gastric reflux.  10.  Chronic neck and back pain.  11.  Anxiety.  12.  Depression.  11.  Asthma.  12.  History of leg surgery.  13.  History of tubal ligation.  14.  Sinus surgery.  15.  Cholecystectomy.   HOME MEDICATIONS:  1.  Ventolin HFA.  2.  Trazodone.  3.  Topamax.  4.  Spiriva.  5.  Singulair.  6.  Risperdal.  7.  Potassium chloride.  8.  Omeprazole.  9.  MiraLAX.  10.  Imodium.  11.  Lasix.  12.  Iron sulfate.  13.  Effexor.  14.  Colace.  15.  Cyclobenzaprine. 16.  Advair Diskus.  17.  Acetaminophen.   ALLERGIES:  1.  IODINE.  2.  SEAFOOD.  3.  VICODIN.  4.  DARVOCET.  5.  SULFA.  6.  MOTRIN.  7.  BIAXIN.  8.  NORTRIPTYLINE.  9.  LYRICA.  10.  NEURONTIN.  11.  ASPIRIN.  12.  CODEINE.  13.  TAPE.   SOCIAL HISTORY: Lives in a [facility] care home. Denies tobacco, alcohol  use.   SOCIAL HISTORY: Noncontributory.   REVIEW OF SYSTEMS: A 12-point review of systems review was obtained. Pertinent positives and negatives as above.   PHYSICAL EXAMINATION: VITAL SIGNS: Temperature 97.6, pulse 82, blood pressure 103/62, respirations 18, 99% on room air.  GENERAL: No acute distress. Alert and oriented x3.  HEAD: Normocephalic, atraumatic.  EYES: No scleral icterus. No conjunctivitis.  FACE: No obvious facial trauma. Normal external nose, normal external ears.   CHEST: Lungs clear to auscultation and moving air well.  HEART: Regular rate and rhythm.  No murmurs, rubs or  gallops.  ABDOMEN: Soft, nontender, nondistended.  EXTREMITIES: Has approximately 6 x 2 cm healing wound in the left medial calf. Has an approximately 5 x 5 healing wound on right medial leg. Has an approximately 7 x 5 cm hematoma with obvious fluctuance, but no obvious skin breakdown or even really bruising at this time. Sensation in the lower extremities is insensate.   LABATORIES:  Significant for white cell count of 7.9, hemoglobin 12.9, hematocrit 40, platelets are 325.   LFTs and BMP is normal.   Ultrasound of the lower extremity shows a probable hematoma.   ASSESSMENT AND PLAN: The patient is a pleasant 49 year old female with history of right  leg hematoma. I have considered lancing it, but do appreciate that the skin is causing a biologic band aid and the tamponade is keeping her from bleeding more. I do not see any skin compromise at this time, but we will watch her closely as an outpatient and consider for drainage. We will consider drainage if there are signs or symptoms that this wound is infected or if there is skin compromise. I have communicated this to Dr. Clemens Catholic, and she agrees with this plan.   ____________________________ Si Raider. Jermain Curt, MD cal:cc D: 05/28/2012 16:10:49 ET T: 05/28/2012 16:55:52 ET JOB#: [id number]  cc: Cristal Deer A. Salimatou Simone, MD,  <Dictator> Jarvis Newcomer MD ELECTRONICALLY SIGNED 05/30/2012 17:10

## 2014-06-10 NOTE — Op Note (Signed)
PATIENT NAME:  Melanie Berry, SAAM MR#:  [id number] DATE OF BIRTH:  [birth year]  DATE OF PROCEDURE:  03/02/2012  PREOPERATIVE DIAGNOSIS:  Left lower extremity subcutaneous hematoma.   POSTOPERATIVE DIAGNOSIS:  Left lower extremity subcutaneous hematoma.  PROCEDURE PERFORMED:  Incision and drainage, evacuation of left lower extremity hematoma.   SURGEON:  Raynald Kemp, MD FACS  ANESTHESIA:  10 mL of 0.25% plain Marcaine.   DESCRIPTION OF PROCEDURE:  With the patient in the hospital bed, the left lower extremity was sterilely prepped and draped around the hematoma which measured most of the anterior and medial aspect of the left leg.  Timeout was observed.  Informed consent was obtained.  Local anesthesia was then infiltrated along the medial aspect of the left leg over the most superficial area of the subcutaneous hematoma.  A small skin incision was fashioned measuring approximately 1.5 cm.  Attempts at evacuation of the hematoma resulted in tearing of the skin by just gentle manipulation of the dermis.  With the hematoma evacuated, the skin edges were reapproximated utilizing a running 4-0 Prolene suture with tapered needle.  Athe wound measured about 5-6 cm.  A sterile occlusive compressive dressing was then placed.  The patient tolerated the procedure well without immediate complication.     ____________________________ Redge Gainer Egbert Garibaldi, MD FACS Verta Ellen D: 03/03/2012 20:35:55 ET T: 03/03/2012 23:56:15 ET JOB#: [id number]  cc: Loraine Leriche A. Egbert Garibaldi, MD, <Dictator> Raynald Kemp MD ELECTRONICALLY SIGNED 03/05/2012 22:33

## 2014-06-10 NOTE — Consult Note (Signed)
PATIENT NAME:  Melanie Berry, Melanie Berry MR#:  [id number] DATE OF BIRTH:  08/21/1965  DATE OF CONSULTATION:  08/19/2012  CONSULTING PHYSICIAN:  Izola Price. Jaclynn Major, MD  CHIEF COMPLAINT: "I'm feeling bad, I'm not on the right medicines."   Melanie Berry is a 49 year old Caucasian female who presents to the ED brought in by EMS. She reports that she called them because she got upset with the staff and she does not like where she is being housed. She really wants a call bell. She also wants to be on Klonopin, which was discontinued, and she is now on Risperdal. She reports that "nothing is right." She wants to be "total care." She goes on to say that if she does not have that she will not be well taken care. I asked her about her suicidal statement and she replied, "I just want to skilled nursing facility with a call bell. I'm not going to kill myself."   HISTORY OF PRESENT ILLNESS: Melanie Berry reports that she has a long history of being on psychiatric medicines. That she believes that the most helpful medicine she has been on is Klonopin with Xanax or Ativan. She is sure that she has had depression since she was young. She reports she has a lot "a lot of anxiety too."   We put in a call to her care person at [facility]. She reports that Melanie Berry has spells like this, but in fact she is approved only for their facility level of care. On interview, she is entitled, irritable and demanding. She reports that she does not sleep as well without her Klonopin. She is irritable and her speech is aggressive. There is no psychosis noted. No paranoia, no disorganization or negative symptoms. She is cognitively intact, but she has many somatic complaints, which she wishes to be addressed. She appears to have alert consciousness and is attentive. She is very focused on getting her level of care changed.   FIRST TREATMENT: Melanie Berry not remember her first treatment. She has had previous hospitalizations; the last one  and that we are aware of was at [facility] for left lower extremity cellulitis and hematoma on 03/02/2012.   CURRENT OUTPATIENT TREATMENT: She is seen by Dr. Lacie Scotts at the [facility].   SUBSTANCE ABUSE TREATMENT HISTORY: She denies.   CURRENT MEDICATIONS: Psychotropic medications include: Venlafaxine 75 mg p.o. extended release 3 caps daily, risperidone 1 mg p.o. b.i.d., Topiramate  25 mg 1 p.o. daily. Other medications: Omeprazole 20 mg per delayed release capsule ,Advair Diskus 500 mcg 50 mcg inhalation powder, 1 puff inhaled every 12 hours, Spiriva 18 mcg inhalation capsule one inhaled once a day at 8:00 a.m., ferrous sulfate 325 mg oral tab daily, montelukast 10 mg p.o. daily and furosemide 40 mg p.o. b.i.d.   ALLERGIES: IODINE, SEAFOOD VICODIN, DARVOCET, SULFA DRUGS, MOTRIN, BIAXIN, NORTRIPTYLINE, LYRICA, NEURONTIN, ASA, CODEINE AND TAPE.   SUBSTANCE ABUSE: Alcohol and drugs she denies.   PAST MEDICAL AND SURGICAL HISTORY: Dysphagia,  lymphedema, Charcot Marie Tooth disease, chronic obstructive pulmonary disease,  history of cellulitis, osteoporosis, gastric reflux, chronic neck and back pain, asthma, muscular dystrophy, bilateral leg surgery, history of tubal ligation, sinus surgery, cholecystectomy.  She denies any history of mental illness in the family.   SOCIAL HISTORY: She lives at the [facility]. She is married. She denies any history of family involvement. She denies any history or sexual abuse. She denies any history of arrest or incarceration.   MENTAL  STATUS EXAMINATION:  Her speech is fluent and pressured. Her mood is irritable, angry. She appears irritable and labile. Her thought processes are linear ,logical and goal oriented. Her thought content is devoid of suicidal ideation, intent or plan, homicidal ideation, intent or plan. There are no auditory or visual hallucinations. No delusions. No depersonalization, no derealizations. There are no command auditory  hallucinations. She is oriented x 4. Her attention is alert and she is awake. Her concentration is fair. Her memory is  intact at times and impaired at others. Her fund of knowledge fair. Her language is fair. Her judgment is fair. Her insight is poor.   REVIEW OF SYSTEMS: Noncontributory.   PRINCIPAL AND PRIMARY:   AXIS I:  Personality disorder, not otherwise specified.                 R/O Bipolar Disorder NOS AXIS II: Deferred.   AXIS III:  Dysphagia,  lymphedema, Charcot Marie Tooth disease, chronic obstructive pulmonary disease,  history of cellulitis, osteoporosis, gastric reflux, chronic neck and back pain, asthma, muscular dystrophy, bilateral leg surgery, history of tubal ligation, sinus surgery, cholecystectomy.   AXIS IV:  Fair to moderate.   AXIS V:  60-65.   PLAN:  Melanie Berry is to return to the [facility].  They are to monitor her and refer for skilled care if that becomes necessary.    ____________________________ Izola Price Jaclynn Major, MD fcg:cc D: 08/19/2012 14:35:46 ET T: 08/19/2012 15:36:25 ET JOB#: [id number]  cc: Izola Price. Jaclynn Major, MD, <Dictator> Maryan Puls MD ELECTRONICALLY SIGNED 08/20/2012 13:01
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
## 2014-06-11 NOTE — Discharge Summary (Signed)
PATIENT NAME:  Melanie Berry, Melanie Berry MR#:  [id number] DATE OF BIRTH:  March 24, 1965  DATE OF ADMISSION:  03/11/2013 DATE OF DISCHARGE:  03/17/2013   PRESENTING COMPLAINT: Weakness and foul-smelling urine. Husband cannot take care of the patient.   DISCHARGE DIAGNOSES:  1.  Systemic inflammatory response syndrome secondary to urinary tract infection, resolved.  2.  Chronic obstructive pulmonary disease, stable.  3.  Depression and anxiety, stable.  4.  Failure to thrive. The patient has chronic disability with Charcot-Marie-Tooth disease. The patient is pretty much bedbound.  5.  Gastroesophageal reflux disease.   MEDICATIONS AT DISCHARGE:  1.  K-Dur 20 mEq b.i.d.  2.  Topamax 25 mg daily.  3.  Docusate 100 mg b.i.d.  4.  Vitamin D3 5000 units daily.  5.  Gelocast [employer] as instructions.  6.  Prozac 20 mg daily.  7.  Geodon 40 mg daily.  8.  Tylenol 650 mg p.o. q.4 p.r.n.  9.  Clonazepam 0.5 mg 4 times daily.  10.  Trazodone 100 mg at bedtime.  11.  Ferrous sulfate 325 mg p.o. b.i.d.  12.  Lasix 40 mg b.i.d.  13.  Omeprazole 20 mg daily.   CONSULTATIONS:  Child psychotherapist.   LABORATORIES:   H and H is 11.4 and 34.8. White count is 7.2, platelet count is 284. Basic metabolic panel within normal limits except potassium of 3.3. Magnesium 1.8. Urinalysis positive for UTI. Blood cultures negative in 5 days.   BRIEF SUMMARY OF HOSPITAL COURSE: Melanie Berry is a 49 year old Caucasian female with history of Charcot-Marie-Tooth disease with history of diabetes, COPD, who comes into the emergency room after she was found to have foul-smelling urine. Her husband has not been able to take care and she has significant psychosocial issues regarding her care. She was admitted with:  1.  SIRS secondary to UTI. She completed a course of antibiotic in the hospital. Remained afebrile. White count is stable and her blood cultures have been negative.  2.  COPD, stable. The patient has history of smoking, has not  smoked since she has been in the hospital. She is advised smoking cessation. She was on Advair and Spiriva. The patient remained stable. Due to the cost of the medications for her for transitioning to long terma facility the patient was recommended to see if we can hold off on the  Advair and Spiriva. as her repsiraotry status was stable. She is was agreeable to it.  3.  Depression, anxiety. The patient's Latuda was changed to Geodon 40 mg p.o. daily and her Effexor-XR was changed to Prozac 20 mg p.o. daily. This was done in discussion with Dr. Omelia Blackwater,  psychiatrist, which is the patient's psychiatrist.  4.  GERD. Continue Prilosec.   Hospital stay otherwise remained stable. The patient will follow up with Dr. Omelia Blackwater as outpatient.   DIET: Regular diet.   TIME SPENT: 40 minutes.    ____________________________ Wylie Hail Allena Katz, MD sap:np D: 03/18/2013 14:00:06 ET T: 03/18/2013 15:38:23 ET JOB#: [id number]  cc: Melanie Wiles A. Allena Katz, MD, <Dictator> Melanie Ora MD ELECTRONICALLY SIGNED 03/18/2013 16:09

## 2014-06-11 NOTE — Discharge Summary (Signed)
ADDENDUM  PATIENT NAME:  NAARAH, BORGERDING MR#:  [id number] DATE OF BIRTH:  [birth year]  DATE OF ADMISSION:  03/11/2013 DATE OF DISCHARGE:  03/19/2013  Change Geodon to 20 mg p.o. daily.  ____________________________ Wylie Hail Allena Katz, MD sap:aw D: 03/19/2013 12:42:18 ET T: 03/19/2013 12:53:52 ET JOB#: [id number]  cc: Donnel Venuto A. Allena Katz, MD, <Dictator> Willow Ora MD ELECTRONICALLY SIGNED 04/01/2013 13:17
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
## 2014-06-12 NOTE — H&P (Signed)
PATIENT NAME:  Melanie Berry, Melanie Berry MR#:  [id number] DATE OF BIRTH:  Dec 07, 1965  DATE OF ADMISSION:  08/26/2011  REFERRING PHYSICIAN: Dr. Manson Passey FAMILY PHYSICIAN: Dr. Juel Burrow  REASON FOR ADMISSION: Progressive lower extremity cellulitis with failure to thrive.   HISTORY OF PRESENT ILLNESS: The patient is a 49 year old female with a history of chronic lymphedema and Charcot-Marie-Tooth syndrome who has had ulceration to the right lower extremity over the past six months. She has not followed up with her doctor on a regular basis. She has been on p.o. antibiotics for this. Presents now with progressive lower extremity edema and erythema with inability to ambulate. In the Emergency Room, the patient was noted to be hypokalemic and extremely weak and tachycardic. She is now admitted for further evaluation.   PAST MEDICAL HISTORY:  1. Charcot-Marie-Tooth syndrome.  2. Chronic lymphedema.  3. Hypokalemia.  4. History of MRSA.  5. Chronic obstructive pulmonary disease/asthma.  6. Chronic lower extremity cellulitis.  7. Osteoporosis.  8. Gastroesophageal reflux disease.  9. Chronic neck and back pain.  10. Anxiety/depression.  11. Status post bilateral tubal ligation. 12. Status post cholecystectomy.   MEDICATIONS:  1. Advair 500/50, 1 puff b.i.d.  2. Ambien 5 mg p.o. at bedtime.  3. Klonopin 0.5 mg p.o. t.i.d.  4. Effexor-XR 225 mg p.o. daily.  5. Iron sulfate 325 mg p.o. daily.  6. Flexeril 10 mg p.o. t.i.d.  7. Lasix 40 mg p.o. b.i.d.  8. Prilosec 20 mg p.o. daily.  9. Potassium chloride 40 mEq p.o. b.i.d.  10. Spiriva 1 capsule inhaled daily.  11. Singulair 10 mg p.o. daily.  12. Topamax 25 mg p.o. at bedtime.  13. Ventolin 2 puffs q.i.d.   ALLERGIES: Iodine, seafood, Vicodin, sulfa, Motrin, Biaxin, nortriptyline, Lyrica, aspirin, codeine, and tape.   SOCIAL HISTORY: The patient has a long-standing history of tobacco abuse. Denies alcohol abuse.   FAMILY HISTORY: Positive for  hypertension and diabetes.    REVIEW OF SYSTEMS: CONSTITUTIONAL: No fever although she has had worsening weakness. No change in weight. EYES: No blurred or double vision. No glaucoma. ENT: No tinnitus or hearing loss. No nasal discharge or bleeding. No difficulty swallowing. RESPIRATORY: Has had some cough but no wheezing. Denies hemoptysis. CARDIOVASCULAR: No chest pain, orthopnea. No palpitations or syncope. GASTROINTESTINAL: Nausea but no vomiting or diarrhea. No abdominal pain. No change in bowel habits. GENITOURINARY: No dysuria, hematuria. No incontinence. ENDOCRINE: No polyuria or polydipsia. No heat or cold intolerance. HEMATOLOGIC: Patient denies anemia, easy bruising, or bleeding. LYMPHATIC: No swollen glands. MUSCULOSKELETAL: Patient denies pain in her neck, back, and legs. Denies shoulder or knee pain. No gout. NEUROLOGIC: No numbness, although she is generally weak. Denies migraines, stroke or seizures. PSYCH: Patient denies anxiety, insomnia, or depression.   PHYSICAL EXAMINATION:  GENERAL: The patient is in no acute distress. She is somewhat somnolent and lethargic.   VITAL SIGNS: Vital signs remarkable for a blood pressure of 133/92 with a heart rate of 111, respiratory rate of 18. She is afebrile.   HEENT: Normocephalic, atraumatic. Pupils equally round and reactive to light and accommodation. Extraocular movements are intact. Sclerae nonicteric. Conjunctivae are clear. Oropharynx is dry but clear.   NECK: Supple without jugular venous distention or bruits. No adenopathy or thyromegaly is noted.   LUNGS: Clear to auscultation and percussion without wheezes, rales, or rhonchi. No dullness.   CARDIAC: Regular rate and rhythm. Normal S1 and S2. No significant rubs, murmurs, or gallops. PMI is nondisplaced. Chest wall is nontender.  ABDOMEN: Soft, nontender, with normoactive bowel sounds. No organomegaly or mass were appreciated. No hernias or bruits were noted.   EXTREMITIES: 2 to  3+ edema bilaterally with erythema up to the knee on the right with a large ulceration on the anterior aspect of the right shin. There is some purulence. There is some mild erythema to the left foot and ankle.   NEUROLOGIC: Cranial nerves II through XII grossly intact. Deep tendon reflexes were symmetric. 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 Motor exam was nonfocal.   PSYCH: Exam revealed a patient who is alert and oriented to person, place, and time. She was cooperative and used good judgment.   LABORATORY, DIAGNOSTIC AND RADIOLOGICAL DATA: Glucose was 53 with a BUN of 6 and a creatinine of 0.44 with a potassium of 2.9 and a sodium of 142. White count was 10.4 with a hemoglobin of 14.7.   ASSESSMENT:  1. Progressive cellulitis despite p.o. antibiotics.  2. Right lower extremity ulceration.  3. Peripheral vascular disease.  4. Hypokalemia.  5. Hypoglycemia.  6. Charcot-Marie-Tooth syndrome.  7. Chronic lymphedema.  8. History of MRSA.  9. Chronic obstructive pulmonary disease/asthma.   PLAN: Patient will be admitted to the floor with her usual pulmonary medications. Will send off blood cultures and begin IV antibiotics. Will consult the wound care team as well as vascular surgery in regards to her lower extremity symptoms including cellulitis and ulceration. Will begin IV fluids with potassium supplementation. Follow up routine labs in the morning. She will require long-term placement. Will consult care management for this. Will also get a physical therapy consult. Further treatment and evaluation will depend upon the patient's progress.   TOTAL TIME SPENT ON THIS PATIENT: 50 minutes.    ____________________________ Duane Lope Judithann Sheen, MD jds:cms D: 08/26/2011 18:58:16 ET T: 08/27/2011 06:27:58 ET JOB#: [id number]  cc: Duane Lope. Judithann Sheen, MD, <Dictator> Corky Downs, MD Kimoni Pagliarulo Rodena Medin MD ELECTRONICALLY SIGNED 08/27/2011 7:52

## 2014-06-12 NOTE — Consult Note (Signed)
Patient with chronic RLE wound for many months.  Admitted with cellulitis.  The wound was recently dressed by the wound care team, so I did not see it tonight.  She has 2-DP pulses on each side.  Can not feel a PT pulse on right, but that area is where the wound is and is heavily bandaged.  Could consider angiogram to evaluate for arterial disease, and tibial/small vessel disease could be present.  Will reassess wound tomorrow at dressing change if possible and follow along.  Electronic Signatures: Annice Needy (MD)  (Signed on 09-Jul-13 19:20)  Authored  Last Updated: 09-Jul-13 19:20 by Annice Needy (MD)

## 2014-07-05 ENCOUNTER — Other Ambulatory Visit: Payer: Self-pay | Admitting: Family Medicine

## 2014-07-05 DIAGNOSIS — R2231 Localized swelling, mass and lump, right upper limb: Secondary | ICD-10-CM

## 2014-07-05 DIAGNOSIS — N63 Unspecified lump in unspecified breast: Secondary | ICD-10-CM

## 2014-07-08 ENCOUNTER — Ambulatory Visit: Payer: Self-pay

## 2014-07-08 ENCOUNTER — Other Ambulatory Visit: Payer: Self-pay

## 2014-07-08 ENCOUNTER — Ambulatory Visit: Payer: Self-pay | Attending: Family Medicine

## 2014-08-01 ENCOUNTER — Ambulatory Visit: Payer: Self-pay

## 2014-08-01 ENCOUNTER — Other Ambulatory Visit: Payer: Self-pay

## 2014-08-17 ENCOUNTER — Other Ambulatory Visit: Payer: Self-pay

## 2014-08-17 ENCOUNTER — Ambulatory Visit: Payer: Self-pay | Attending: Family Medicine

## 2015-01-20 ENCOUNTER — Other Ambulatory Visit: Payer: Self-pay | Admitting: Otolaryngology

## 2015-01-20 DIAGNOSIS — R51 Headache: Principal | ICD-10-CM

## 2015-01-20 DIAGNOSIS — R519 Headache, unspecified: Secondary | ICD-10-CM

## 2015-01-25 ENCOUNTER — Ambulatory Visit: Payer: Medicaid Other

## 2015-02-06 ENCOUNTER — Ambulatory Visit: Admission: RE | Admit: 2015-02-06 | Payer: Medicaid Other | Source: Ambulatory Visit

## 2015-04-28 ENCOUNTER — Emergency Department
Admission: EM | Admit: 2015-04-28 | Discharge: 2015-04-28 | Disposition: A | Discharge status: Home / Self Care | Payer: Medicaid Other | Attending: Emergency Medicine | Admitting: Emergency Medicine

## 2015-04-28 ENCOUNTER — Encounter: Payer: Self-pay | Admitting: Emergency Medicine

## 2015-04-28 ENCOUNTER — Emergency Department: Payer: Medicaid Other

## 2015-04-28 DIAGNOSIS — Y998 Other external cause status: Secondary | ICD-10-CM | POA: Insufficient documentation

## 2015-04-28 DIAGNOSIS — X58XXXA Exposure to other specified factors, initial encounter: Secondary | ICD-10-CM | POA: Insufficient documentation

## 2015-04-28 DIAGNOSIS — S4992XA Unspecified injury of left shoulder and upper arm, initial encounter: Secondary | ICD-10-CM | POA: Diagnosis not present

## 2015-04-28 DIAGNOSIS — Y9389 Activity, other specified: Secondary | ICD-10-CM | POA: Insufficient documentation

## 2015-04-28 DIAGNOSIS — Y9289 Other specified places as the place of occurrence of the external cause: Secondary | ICD-10-CM | POA: Insufficient documentation

## 2015-04-28 DIAGNOSIS — M25512 Pain in left shoulder: Secondary | ICD-10-CM

## 2015-04-28 HISTORY — DX: Chronic obstructive pulmonary disease, unspecified: J44.9

## 2015-04-28 NOTE — ED Notes (Signed)
Pt arrived by EMS from [facility] facility with c/o left shoulder pain x1 week. EMS reports, per facility, that pt has had xray at facility, was told it was arthritis. Facility made appointments to orthopedic office, pt has missed the appointments. Pt reports she has had the shoulder pain for 1 week and that she "heard a pop" 2 days ago and the pain has increased since. Pt also reports she has only missed one ortho appointment due to "not feeling well." Pt arrived by EMS on 3L O2, pt has hx of COPD and stays on 3L at all times. O2 stat 100% upon arrival.

## 2015-04-28 NOTE — Discharge Instructions (Signed)

## 2015-04-28 NOTE — ED Provider Notes (Signed)
[facility] Emergency Department Provider Note  Time seen: 12:48 AM  I have reviewed the triage vital signs and the nursing notes.   HISTORY  Chief Complaint Shoulder Pain    HPI Melanie Berry is a 50 y.o. female with a past medical history of COPD who comes from her nursing home for left shoulder pain. According to the patient for the past one month she has been having intermittent left shoulder pain. Per facility they have obtained x-ray showing arthritis changes, they've obtain orthopedic appointments however the patient has missed her canceled the appointments and has not gone yet. Patient states yesterday she felt a pop in the left shoulder and has had increased pain since. Patient denies any chest or abdominal pain. Patient states she is on chronic pain medications.     Past Medical History  Diagnosis Date  . COPD (chronic obstructive pulmonary disease) [facility])     Patient Active Problem List   Diagnosis Date Noted  . URINARY INCONTINENCE 07/22/2006  . CYSTITIS, ACUTE 05/13/2006  . 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 CANDIDIASIS 01/06/2006  . HYPERLIPIDEMIA 01/06/2006  . HYPOKALEMIA 01/06/2006  . TOBACCO ABUSE 01/06/2006  . DEPRESSION 01/06/2006  . 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 VENOUS INSUFFICIENCY 01/06/2006  . ALLERGIC RHINITIS 01/06/2006  . 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 TONGUE DISORDER 01/06/2006  . GERD 01/06/2006  . LOW BACK PAIN 01/06/2006  . SLEEP APNEA 01/06/2006  . LEG EDEMA 01/06/2006  . STATUS, ARTHRODESIS 01/06/2006    No past surgical history on file.  No current outpatient prescriptions on file.  Allergies Aspirin; Clarithromycin; Fentanyl; Gabapentin; Methocarbamol; Nicotine; Nortriptyline hcl; Potassium iodide; Propoxyphene n-acetaminophen; Sulfonamide derivatives; and Tramadol hcl  No family history on file.  Social History Social History  Substance Use Topics  . Smoking  status: Not on file  . Smokeless tobacco: Not on file  . Alcohol Use: Not on file    Review of Systems Constitutional: Negative for fever. Cardiovascular: Negative for chest pain. Respiratory: Negative for shortness of breath. Gastrointestinal: Negative for abdominal pain Musculoskeletal: Positive left shoulder pain 10-point ROS otherwise negative.  ____________________________________________   PHYSICAL EXAM:  VITAL SIGNS: ED Triage Vitals  Enc Vitals Group     BP 04/28/15 0047 111/81 mmHg     Pulse Rate 04/28/15 0029 86     Resp 04/28/15 0029 16     Temp 04/28/15 0029 98 F (36.7 C)     Temp Source 04/28/15 0029 Oral     SpO2 04/28/15 0028 98 %     Weight 04/28/15 0029 239 lb (108.41 kg)     Height 04/28/15 0029  (1.626 m)     Head Cir --      Peak Flow --      Pain Score 04/28/15 0031 7     Pain Loc --      Pain Edu? --      Excl. in GC? --     Constitutional: Alert and oriented. Well appearing and in no distress. Eyes: Normal exam ENT   Head: Normocephalic and atraumatic.   Mouth/Throat: Mucous membranes are moist. Cardiovascular: Normal rate, regular rhythm. No murmur Respiratory: Normal respiratory effort without tachypnea nor retractions. Breath sounds are clear  Gastrointestinal: Soft and nontender. No distention.  Musculoskeletal: Moderate tenderness to left shoulder. Neurovascular intact. 2+ radial pulse. Good range of motion in left shoulder although with pain. Neurologic:  Normal speech and language. No gross focal neurologic deficits Skin:  Skin is  warm, dry and intact.  Psychiatric: Mood and affect are normal. Speech and behavior are normal.  ____________________________________________   RADIOLOGY  Negative x-ray  ____________________________________________    INITIAL IMPRESSION / ASSESSMENT AND PLAN / ED COURSE  Pertinent labs & imaging results that were available during my care of the patient were reviewed by me and  considered in my medical decision making (see chart for details).  Patient presents with 1 month of left shoulder pain worse for the past 2 days after she heard a pop in the left shoulder. Per nursing home this is been evaluated with x-rays showing arthritic changes, they've scheduled her for orthopedic appointments but she has not gone yet. Patient admits to missing her orthopedic appointment because she did not feel well on the day of the appointment. They've since rescheduled her for orthopedics, although she has not gone yet. Given the recent pop with worsening pain over the past 2 days blue reexamined the patient's left shoulder today. The patient does have moderate tenderness to palpation of the left shoulder although good range of motion, do not suspect fracture or dislocation at this time. Neurovascular intact distally.  X-ray negative for fracture or dislocation. We'll have the patient follow up with orthopedics for which she already has an appointment per patient. Patient currently on chronic pain medication. We'll discharge home at this time.  ____________________________________________   FINAL CLINICAL IMPRESSION(S) / ED DIAGNOSES  Shoulder pain, left   Minna Antis, MD 04/28/15 [id number]

## 2015-05-03 ENCOUNTER — Other Ambulatory Visit
Admission: RE | Admit: 2015-05-03 | Discharge: 2015-05-03 | Disposition: A | Discharge status: Home / Self Care | Payer: Medicaid Other | Source: Skilled Nursing Facility | Attending: Physician Assistant | Admitting: Physician Assistant

## 2015-05-03 DIAGNOSIS — N390 Urinary tract infection, site not specified: Secondary | ICD-10-CM | POA: Diagnosis not present

## 2015-05-03 LAB — URINALYSIS COMPLETE WITH MICROSCOPIC (ARMC ONLY)
BILIRUBIN URINE: NEGATIVE
Bacteria, UA: NONE SEEN
GLUCOSE, UA: NEGATIVE mg/dL
KETONES UR: NEGATIVE mg/dL
NITRITE: POSITIVE — AB
Protein, ur: NEGATIVE mg/dL
RBC / HPF: NONE SEEN RBC/hpf (ref 0–5)
SPECIFIC GRAVITY, URINE: 1.017 (ref 1.005–1.030)
Squamous Epithelial / LPF: NONE SEEN
WBC, UA: NONE SEEN WBC/hpf (ref 0–5)
pH: 6 (ref 5.0–8.0)

## 2015-05-05 LAB — URINE CULTURE

## 2015-06-18 ENCOUNTER — Emergency Department
Admission: EM | Admit: 2015-06-18 | Discharge: 2015-06-18 | Disposition: A | Discharge status: Skilled Nursing Facility | Payer: Medicaid Other | Attending: Emergency Medicine | Admitting: Emergency Medicine

## 2015-06-18 ENCOUNTER — Emergency Department: Payer: Medicaid Other

## 2015-06-18 DIAGNOSIS — Z791 Long term (current) use of non-steroidal anti-inflammatories (NSAID): Secondary | ICD-10-CM | POA: Insufficient documentation

## 2015-06-18 DIAGNOSIS — Z79899 Other long term (current) drug therapy: Secondary | ICD-10-CM | POA: Insufficient documentation

## 2015-06-18 DIAGNOSIS — E785 Hyperlipidemia, unspecified: Secondary | ICD-10-CM | POA: Insufficient documentation

## 2015-06-18 DIAGNOSIS — F329 Major depressive disorder, single episode, unspecified: Secondary | ICD-10-CM | POA: Insufficient documentation

## 2015-06-18 DIAGNOSIS — Z87891 Personal history of nicotine dependence: Secondary | ICD-10-CM | POA: Insufficient documentation

## 2015-06-18 DIAGNOSIS — Z7951 Long term (current) use of inhaled steroids: Secondary | ICD-10-CM | POA: Diagnosis not present

## 2015-06-18 DIAGNOSIS — J441 Chronic obstructive pulmonary disease with (acute) exacerbation: Secondary | ICD-10-CM | POA: Diagnosis not present

## 2015-06-18 DIAGNOSIS — Z792 Long term (current) use of antibiotics: Secondary | ICD-10-CM | POA: Diagnosis not present

## 2015-06-18 DIAGNOSIS — R0602 Shortness of breath: Secondary | ICD-10-CM | POA: Diagnosis present

## 2015-06-18 HISTORY — DX: Anemia, unspecified: D64.9

## 2015-06-18 HISTORY — DX: Fluid overload, unspecified: E87.70

## 2015-06-18 HISTORY — DX: Major depressive disorder, single episode, unspecified: F32.9

## 2015-06-18 LAB — BASIC METABOLIC PANEL
Anion gap: 7 (ref 5–15)
BUN: 8 mg/dL (ref 6–20)
CALCIUM: 8.5 mg/dL — AB (ref 8.9–10.3)
CO2: 20 mmol/L — ABNORMAL LOW (ref 22–32)
CREATININE: 0.43 mg/dL — AB (ref 0.44–1.00)
Chloride: 114 mmol/L — ABNORMAL HIGH (ref 101–111)
GFR calc Af Amer: >60 mL/min (ref >60)
Glucose, Bld: 103 mg/dL — ABNORMAL HIGH (ref 65–99)
Potassium: 3.9 mmol/L (ref 3.5–5.1)
SODIUM: 141 mmol/L (ref 135–145)

## 2015-06-18 LAB — TROPONIN I

## 2015-06-18 LAB — CBC
HCT: 40.7 % (ref 35.0–47.0)
Hemoglobin: 13.2 g/dL (ref 12.0–16.0)
MCH: 26.8 pg (ref 26.0–34.0)
MCHC: 32.5 g/dL (ref 32.0–36.0)
MCV: 82.4 fL (ref 80.0–100.0)
PLATELETS: 331 10*3/uL (ref 150–440)
RBC: 4.94 MIL/uL (ref 3.80–5.20)
RDW: 15.2 % — AB (ref 11.5–14.5)
WBC: 10.9 10*3/uL (ref 3.6–11.0)

## 2015-06-18 MED ORDER — PREDNISONE 10 MG PO TABS: ORAL_TABLET | ORAL | Status: DC

## 2015-06-18 NOTE — ED Notes (Signed)
Pt came to ED via EMS from [facility]. Pt reports sob x 1week. Pt reports wears oxygen as needed. Pt 100% on 2 L [place]. Pt reports she just had pneumonia. Pt reports feeling weak. Denies any fevers or chest pain. 2 duonebs and 125mg  solumedrol given by EMS.

## 2015-06-18 NOTE — Discharge Instructions (Signed)
You were evaluated for shortness of breath and your exam and evaluation are reassuring in the emergency department today. No pneumonia was seen on your x-ray. You are being treated for COPD exacerbation with prednisone. Continue your albuterol every 4 hours as needed for wheezing or shortness of breath.  Return to the emergency room for any worsening condition including cough or trouble breathing, chest pain, altered mental status, dizziness or passing out.   Chronic Obstructive Pulmonary Disease Exacerbation Chronic obstructive pulmonary disease (COPD) is a common lung problem. In COPD, the flow of air from the lungs is limited. COPD exacerbations are times that breathing gets worse and you need extra treatment. Without treatment they can be life threatening. If they happen often, your lungs can become more damaged. If your COPD gets worse, your doctor may treat you with:  Medicines.  Oxygen.  Different ways to clear your airway, such as using a mask. HOME CARE  Do not smoke.  Avoid tobacco smoke and other things that bother your lungs.  If given, take your antibiotic medicine as told. Finish the medicine even if you start to feel better.  Only take medicines as told by your doctor.  Drink enough fluids to keep your pee (urine) clear or pale yellow (unless your doctor has told you not to).  Use a cool mist machine (vaporizer).  If you use oxygen or a machine that turns liquid medicine into a mist (nebulizer), continue to use them as told.  Keep up with shots (vaccinations) as told by your doctor.  Exercise regularly.  Eat healthy foods.  Keep all doctor visits as told. GET HELP RIGHT AWAY IF:  You are very short of breath and it gets worse.  You have trouble talking.  You have bad chest pain.  You have blood in your spit (sputum).  You have a fever.  You keep throwing up (vomiting).  You feel weak, or you pass out (faint).  You feel confused.  You keep getting  worse. MAKE SURE YOU:  Understand these instructions.  Will watch your condition.  Will get help right away if you are not doing well or get worse.   This information is not intended to replace advice given to you by your health care provider. Make sure you discuss any questions you have with your health care provider.   Document Released: 01/24/2011 Document Revised: 02/25/2014 Document Reviewed: 10/09/2012 Elsevier Interactive Patient Education [employer]

## 2015-06-18 NOTE — ED Provider Notes (Signed)
[facility] Emergency Department Provider Note   ____________________________________________  Time seen: Approximately 6:45 PM I have reviewed the triage vital signs and the triage nursing note.  HISTORY  Chief Complaint Shortness of Breath   Historian Patient  HPI Melanie Berry is a 50 y.o. female who is at [facility] health care, and reports completing antibiotics for pneumonia last Monday, almost week ago. She states that over the past week she's had a little bit more short of breath. Today she felt more short of breath and requested evaluation from the nursing home, and when she felt like she wasn't getting care, she called EMS to bring her to the hospital.  No fevers. Mostly dry cough. She has had a little bit of diarrhea. No chest pain. She's been wearing oxygen 2 L at the nursing home. Symptoms are mild to moderate.    Past Medical History  Diagnosis Date  . COPD (chronic obstructive pulmonary disease) (HCC)   . COPD (chronic obstructive pulmonary disease) (HCC)   . MDD (major depressive disorder) (HCC)   . Anemia   . Fluid overload     Patient Active Problem List   Diagnosis Date Noted  . URINARY INCONTINENCE 07/22/2006  . CYSTITIS, ACUTE 05/13/2006  . 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 CANDIDIASIS 01/06/2006  . HYPERLIPIDEMIA 01/06/2006  . HYPOKALEMIA 01/06/2006  . TOBACCO ABUSE 01/06/2006  . DEPRESSION 01/06/2006  . 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 VENOUS INSUFFICIENCY 01/06/2006  . ALLERGIC RHINITIS 01/06/2006  . 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 TONGUE DISORDER 01/06/2006  . GERD 01/06/2006  . LOW BACK PAIN 01/06/2006  . SLEEP APNEA 01/06/2006  . LEG EDEMA 01/06/2006  . STATUS, ARTHRODESIS 01/06/2006    Past Surgical History  Procedure Laterality Date  . Appendectomy    . Cholecystectomy      Current Outpatient Rx  Name  Route  Sig  Dispense  Refill  . acetaminophen (TYLENOL) 325 MG  tablet   Oral   Take 650 mg by mouth every 6 (six) hours as needed for moderate pain.         Marland Kitchen buPROPion (WELLBUTRIN XL) 300 MG 24 hr tablet   Oral   Take 300 mg by mouth daily.         . Cholecalciferol (VITAMIN D3) 50000 units CAPS   Oral   Take 50,000 Units by mouth every 30 (thirty) days.         Marland Kitchen doxycycline (VIBRA-TABS) 100 MG tablet   Oral   Take 100 mg by mouth 2 (two) times daily.         . fexofenadine (ALLEGRA) 180 MG tablet   Oral   Take 180 mg by mouth daily.         . fluticasone (FLONASE) 50 MCG/ACT nasal spray   Each Nare   Place 2 sprays into both nostrils daily.         . Fluticasone-Salmeterol (ADVAIR) 250-50 MCG/DOSE AEPB   Inhalation   Inhale 1 puff into the lungs 2 (two) times daily.         . furosemide (LASIX) 20 MG tablet   Oral   Take 30 mg by mouth at bedtime.         Marland Kitchen ipratropium-albuterol (DUONEB) 0.5-2.5 (3) MG/3ML SOLN   Nebulization   Take 3 mLs by nebulization every 4 (four) hours as needed (for shortness of breathe or wheezing).         . LORazepam (ATIVAN) 0.5 MG tablet  Oral   Take 0.5 mg by mouth every 8 (eight) hours.         . meloxicam (MOBIC) 7.5 MG tablet   Oral   Take 7.5 mg by mouth daily.         . montelukast (SINGULAIR) 10 MG tablet   Oral   Take 10 mg by mouth at bedtime.         Marland Kitchen omeprazole (PRILOSEC) 20 MG capsule   Oral   Take 20 mg by mouth daily.         Marland Kitchen oxyCODONE (OXY IR/ROXICODONE) 5 MG immediate release tablet   Oral   Take 5 mg by mouth every 4 (four) hours as needed for severe pain.         Marland Kitchen oxyCODONE (OXYCONTIN) 15 mg 12 hr tablet   Oral   Take 15 mg by mouth every 12 (twelve) hours.         . potassium chloride SA (K-DUR,KLOR-CON) 20 MEQ tablet   Oral   Take 40 mEq by mouth daily.         . predniSONE (DELTASONE) 10 MG tablet      50 mg daily for 4 more days   20 tablet   0   . pregabalin (LYRICA) 75 MG capsule   Oral   Take 75 mg by mouth every 8  (eight) hours.         . senna-docusate (SENOKOT-S) 8.6-50 MG tablet   Oral   Take 1 tablet by mouth 2 (two) times daily as needed for mild constipation.         Marland Kitchen tiotropium (SPIRIVA) 18 MCG inhalation capsule   Inhalation   Place 18 mcg into inhaler and inhale daily.         Marland Kitchen topiramate (TOPAMAX) 50 MG tablet   Oral   Take 50 mg by mouth 2 (two) times daily.         Marland Kitchen venlafaxine (EFFEXOR) 37.5 MG tablet   Oral   Take 37.5 mg by mouth 2 (two) times daily.           Allergies Aspirin; Clarithromycin; Fentanyl; Gabapentin; Methocarbamol; Nicotine; Nortriptyline hcl; Potassium iodide; Propoxyphene n-acetaminophen; Sulfonamide derivatives; and Tramadol hcl  No family history on file.  Social History Social History  Substance Use Topics  . Smoking status: Former Games developer  . Smokeless tobacco: None  . Alcohol Use: No    Review of Systems  Constitutional: Negative for fever. Eyes: Negative for visual changes. ENT: Negative for sore throat. Cardiovascular: Negative for chest pain. Respiratory: Positive for shortness of breath. Gastrointestinal: Negative for abdominal pain, vomiting and diarrhea. Genitourinary: Negative for dysuria. Musculoskeletal: Negative for back pain. Skin: Negative for rash. Neurological: Negative for headache. 10 point Review of Systems otherwise negative ____________________________________________   PHYSICAL EXAM:  VITAL SIGNS: ED Triage Vitals  Enc Vitals Group     BP 06/18/15 1724 156/63 mmHg     Pulse Rate 06/18/15 1724 89     Resp 06/18/15 1724 24     Temp 06/18/15 1724 98.1 F (36.7 C)     Temp Source 06/18/15 1724 Oral     SpO2 06/18/15 1724 100 %     Weight 06/18/15 1724 238 lb 11.2 oz (108.274 kg)     Height 06/18/15 1724 5\' 4"  (1.626 m)     Head Cir --      Peak Flow --      Pain Score --      Pain  Loc --      Pain Edu? --      Excl. in GC? --      Constitutional: Alert and oriented. Soft voice. In no acute  respiratory distress. Morbidly obese HEENT   Head: Normocephalic and atraumatic.      Eyes: Conjunctivae are normal. PERRL. Normal extraocular movements.      Ears:         Nose: No congestion/rhinnorhea.   Mouth/Throat: Mucous membranes are moist. Missing teeth.   Neck: No stridor. Cardiovascular/Chest: Normal rate, regular rhythm.  No murmurs, rubs, or gallops. Respiratory: Wearing 2 L nasal cannula oxygen. Normal respiratory effort without tachypnea nor retractions. Mildly diminished breath sounds throughout, tight breath sounds without obvious wheezing or rhonchi. Gastrointestinal: Soft. No distention, no guarding, no rebound. Nontender.   Genitourinary/rectal:Deferred Musculoskeletal: Nontender with normal range of motion in all extremities. No joint effusions.  No lower extremity tenderness. 2+ lower extremity edema equal bilaterally. Neurologic:  Normal speech and language. No gross or focal neurologic deficits are appreciated. Skin:  Skin is warm, dry and intact. No rash noted. Psychiatric: Mood and affect are normal. Speech and behavior are normal. Patient exhibits appropriate insight and judgment.  ____________________________________________   EKG I, [name], MD, the attending physician have personally viewed and interpreted all ECGs.  84 bpm. Normal sinus rhythm. Narrow QRS. Normal axis. Normal ST and T-wave ____________________________________________  LABS (pertinent positives/negatives)  Basic metabolic panel without significant abnormality White blood count 10.9, hemoglobin 13.2 platelet count 331 Troponin less than 0.03  ____________________________________________  RADIOLOGY All Xrays were viewed by me. Imaging interpreted by Radiologist.  Chest two-view: No active cardiac pulmonary disease. __________________________________________  PROCEDURES  Procedure(s) performed: None  Critical Care performed:  None  ____________________________________________   ED COURSE / ASSESSMENT AND PLAN  Pertinent labs & imaging results that were available during my care of the patient were reviewed by me and considered in my medical decision making (see chart for details).   This patient is here for evaluation of shortness of breath and wheezing. She does have a history of COPD, and I think clinically she is having COPD exacerbation. She feels better after DuoNeb treatment which was provided by EMS and Solu-Medrol.  Chest x-ray is clear for no new or recurrent pneumonia. Her white blood cell count is normal and reassuring. Her hemoglobin is 13.2.  Clinically am not suspicious for PE or CHF exacerbation.    CONSULTATIONS:   None   Patient / Family / Caregiver informed of clinical course, medical decision-making process, and agree with plan.   I discussed return precautions, follow-up instructions, and discharged instructions with patient and/or family.   ___________________________________________   FINAL CLINICAL IMPRESSION(S) / ED DIAGNOSES   Final diagnoses:  COPD exacerbation (HCC)              Note: This dictation was prepared with Dragon dictation. Any transcriptional errors that result from this process are unintentional   [name], MD 06/18/15 1911

## 2015-10-15 ENCOUNTER — Emergency Department
Admission: EM | Admit: 2015-10-15 | Discharge: 2015-10-16 | Disposition: A | Discharge status: Home / Self Care | Payer: Medicaid Other | Attending: Emergency Medicine | Admitting: Emergency Medicine

## 2015-10-15 ENCOUNTER — Encounter: Payer: Self-pay | Admitting: Emergency Medicine

## 2015-10-15 ENCOUNTER — Emergency Department: Payer: Medicaid Other

## 2015-10-15 DIAGNOSIS — L97921 Non-pressure chronic ulcer of unspecified part of left lower leg limited to breakdown of skin: Secondary | ICD-10-CM | POA: Diagnosis not present

## 2015-10-15 DIAGNOSIS — M79605 Pain in left leg: Secondary | ICD-10-CM

## 2015-10-15 DIAGNOSIS — Z87891 Personal history of nicotine dependence: Secondary | ICD-10-CM | POA: Insufficient documentation

## 2015-10-15 DIAGNOSIS — J449 Chronic obstructive pulmonary disease, unspecified: Secondary | ICD-10-CM | POA: Diagnosis not present

## 2015-10-15 LAB — CBC WITH DIFFERENTIAL/PLATELET
Basophils Absolute: 0.1 10*3/uL (ref 0–0.1)
Basophils Relative: 1 %
Eosinophils Absolute: 0.3 10*3/uL (ref 0–0.7)
HCT: 45.7 % (ref 35.0–47.0)
Hemoglobin: 15.3 g/dL (ref 12.0–16.0)
LYMPHS ABS: 3.4 10*3/uL (ref 1.0–3.6)
MCH: 27 pg (ref 26.0–34.0)
MCHC: 33.4 g/dL (ref 32.0–36.0)
MCV: 80.7 fL (ref 80.0–100.0)
Monocytes Absolute: 0.6 10*3/uL (ref 0.2–0.9)
NEUTROS ABS: 7.4 10*3/uL — AB (ref 1.4–6.5)
Neutrophils Relative %: 62 %
PLATELETS: 342 10*3/uL (ref 150–440)
RBC: 5.67 MIL/uL — AB (ref 3.80–5.20)
RDW: 14.6 % — ABNORMAL HIGH (ref 11.5–14.5)
WBC: 11.8 10*3/uL — ABNORMAL HIGH (ref 3.6–11.0)

## 2015-10-15 LAB — COMPREHENSIVE METABOLIC PANEL
ALK PHOS: 70 U/L (ref 38–126)
ALT: 12 U/L — AB (ref 14–54)
AST: 21 U/L (ref 15–41)
Albumin: 3.6 g/dL (ref 3.5–5.0)
Anion gap: 7 (ref 5–15)
BILIRUBIN TOTAL: 0.7 mg/dL (ref 0.3–1.2)
BUN: 13 mg/dL (ref 6–20)
CALCIUM: 9.1 mg/dL (ref 8.9–10.3)
CHLORIDE: 104 mmol/L (ref 101–111)
CO2: 26 mmol/L (ref 22–32)
CREATININE: 0.58 mg/dL (ref 0.44–1.00)
Glucose, Bld: 86 mg/dL (ref 65–99)
Potassium: 3.6 mmol/L (ref 3.5–5.1)
Sodium: 137 mmol/L (ref 135–145)
TOTAL PROTEIN: 7 g/dL (ref 6.5–8.1)

## 2015-10-15 MED ORDER — OXYCODONE-ACETAMINOPHEN 5-325 MG PO TABS: 2.0000 | ORAL_TABLET | Freq: Four times a day (QID) | ORAL | 0 refills | Status: DC | PRN

## 2015-10-15 NOTE — ED Provider Notes (Signed)
[facility] Emergency Department Provider Note        Time seen: ----------------------------------------- 8:31 PM on 10/15/2015 -----------------------------------------    I have reviewed the triage vital signs and the nursing notes.   HISTORY  Chief Complaint Leg Pain    HPI Melanie Berry is a 50 y.o. female who presents to the ER complaining of left leg pain. She presents from [facility] health care, stating that the left leg has caused increased pain today. Typically his been wearing Unna boots bilaterally but today states that were hurting her. She is brought in with bilateral medicated dressings to both legs below the knee. She was sent here to rule out DVT. She denies recent illness or fevers.   Past Medical History:  Diagnosis Date  . Anemia   . COPD (chronic obstructive pulmonary disease) (HCC)   . COPD (chronic obstructive pulmonary disease) (HCC)   . Fluid overload   . MDD (major depressive disorder) [facility])     Patient Active Problem List   Diagnosis Date Noted  . URINARY INCONTINENCE 07/22/2006  . CYSTITIS, ACUTE 05/13/2006  . 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 CANDIDIASIS 01/06/2006  . HYPERLIPIDEMIA 01/06/2006  . HYPOKALEMIA 01/06/2006  . TOBACCO ABUSE 01/06/2006  . DEPRESSION 01/06/2006  . 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 VENOUS INSUFFICIENCY 01/06/2006  . ALLERGIC RHINITIS 01/06/2006  . 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 TONGUE DISORDER 01/06/2006  . GERD 01/06/2006  . LOW BACK PAIN 01/06/2006  . SLEEP APNEA 01/06/2006  . LEG EDEMA 01/06/2006  . STATUS, ARTHRODESIS 01/06/2006    Past Surgical History:  Procedure Laterality Date  . APPENDECTOMY    . CHOLECYSTECTOMY      Allergies Aspirin; Clarithromycin; Fentanyl; Gabapentin; Methocarbamol; Nicotine; Nortriptyline hcl; Potassium iodide; Propoxyphene n-acetaminophen; Sulfonamide derivatives; and Tramadol hcl  Social History Social  History  Substance Use Topics  . Smoking status: Former Games developer  . Smokeless tobacco: Never Used  . Alcohol use No    Review of Systems Constitutional: Negative for fever. Cardiovascular: Negative for chest pain. Respiratory: Negative for shortness of breath. Gastrointestinal: Negative for abdominal pain, vomiting and diarrhea. Genitourinary: Negative for dysuria. Musculoskeletal: Positive for leg pain, left greater than right Skin: Negative for rash. Neurological: Negative for headaches, focal weakness or numbness.  10-point ROS otherwise negative.  ____________________________________________   PHYSICAL EXAM:  VITAL SIGNS: ED Triage Vitals [10/15/15 2021]  Enc Vitals Group     BP 110/75     Pulse Rate 94     Resp 18     Temp 98 F (36.7 C)     Temp Source Oral     SpO2 94 %     Weight 218 lb 11.1 oz (99.2 kg)     Height 5\' 3"  (1.6 m)     Head Circumference      Peak Flow      Pain Score      Pain Loc      Pain Edu?      Excl. in GC?     Constitutional: Alert and oriented. Well appearing and in no distress. Eyes: Conjunctivae are normal. PERRL. Normal extraocular movements. ENT   Head: Normocephalic and atraumatic.   Nose: No congestion/rhinnorhea.   Mouth/Throat: Mucous membranes are moist.   Neck: No stridor. Cardiovascular: Normal rate, regular rhythm. No murmurs, rubs, or gallops.Good dopplerable pulses in bilateral feet, dorsalis pedis pulse rate are in the right than the left Respiratory: Normal respiratory effort without tachypnea nor retractions. Breath sounds are  clear and equal bilaterally. No wheezes/rales/rhonchi. Musculoskeletal: Diminished ability to move all of the extremities chronically, muscle wasting is noted. Bilateral Unna boot dressings are present. Left leg is tender to touch but does not appear more swollen than the left. Neurologic:  Normal speech and language. No gross focal neurologic deficits are appreciated.  Skin:  Skin  is warm, dry with chronic appearing lower extremity ulcerations Psychiatric: Mood and affect are normal. Speech and behavior are normal.   ____________________________________________  ED COURSE:  Pertinent labs & imaging results that were available during my care of the patient were reviewed by me and considered in my medical decision making (see chart for details). Clinical Course  Patient presents the ER for leg pain, we will take down her Unna boot dressings, check pulses in both legs, likely obtain ultrasound imaging of both legs.  Procedures ____________________________________________   LABS (pertinent positives/negatives)  Labs Reviewed  CBC WITH DIFFERENTIAL/PLATELET - Abnormal; Notable for the following:       Result Value   WBC 11.8 (*)    RBC 5.67 (*)    RDW 14.6 (*)    Neutro Abs 7.4 (*)    All other components within normal limits  COMPREHENSIVE METABOLIC PANEL - Abnormal; Notable for the following:    ALT 12 (*)    All other components within normal limits  were negative  RADIOLOGY Images were viewed by me  Bilateral lower extremity ultrasounds  ____________________________________________  FINAL ASSESSMENT AND PLAN  Leg pain, chronic edema  Plan: Patient with labs and imaging as dictated above. Patient is in no distress, I will refer her for follow-up with vascular surgery. We will reapply Unna boot dressing. She is stable for discharge.   Emily FilbertWilliams, Jonathan E, MD   Note: This dictation was prepared with Dragon dictation. Any transcriptional errors that result from this process are unintentional    Emily FilbertJonathan E Williams, MD 10/15/15 [phone]

## 2015-10-15 NOTE — ED Triage Notes (Signed)
Pt presents to ED from [facility] c/o L leg pain. Pt typically wear unna boots bilaterally but today said they were hurting her. Dressing wraps noted to BLE. SNF MD wanted pt ruled out for DVT.

## 2015-10-15 NOTE — ED Notes (Signed)
Verbal order given to remove Una boots and check peripheral pulses.

## 2015-10-16 NOTE — ED Notes (Signed)
Wet to dry dressings applied to bilat lower extremities to cover venous stasis ulcers

## 2015-10-16 NOTE — ED Notes (Signed)
E-signature pad not working, pt attempted to sign ems transport and discharge

## 2015-10-16 NOTE — ED Notes (Signed)
Pt resting quietly in hallway, pedal doppler utilized by dr Mayford Knife and pedal pulses auscultated

## 2015-10-16 NOTE — ED Notes (Signed)
Pt ready for discharge, ems called for transport

## 2015-10-16 NOTE — ED Notes (Signed)
Pt resting quietly cont to monitor 

## 2015-11-15 ENCOUNTER — Inpatient Hospital Stay
Admission: EM | Admit: 2015-11-15 | Discharge: 2015-11-17 | DRG: 194 | Disposition: A | Discharge status: Skilled Nursing Facility | Payer: Medicaid Other | Attending: Internal Medicine | Admitting: Internal Medicine

## 2015-11-15 ENCOUNTER — Encounter: Payer: Self-pay | Admitting: Emergency Medicine

## 2015-11-15 ENCOUNTER — Emergency Department: Payer: Medicaid Other

## 2015-11-15 DIAGNOSIS — Z79899 Other long term (current) drug therapy: Secondary | ICD-10-CM

## 2015-11-15 DIAGNOSIS — R000 Tachycardia, unspecified: Secondary | ICD-10-CM

## 2015-11-15 DIAGNOSIS — Z9981 Dependence on supplemental oxygen: Secondary | ICD-10-CM | POA: Diagnosis not present

## 2015-11-15 DIAGNOSIS — K219 Gastro-esophageal reflux disease without esophagitis: Secondary | ICD-10-CM | POA: Diagnosis present

## 2015-11-15 DIAGNOSIS — M1460 Charcot's joint, unspecified site: Secondary | ICD-10-CM | POA: Diagnosis present

## 2015-11-15 DIAGNOSIS — N390 Urinary tract infection, site not specified: Secondary | ICD-10-CM | POA: Diagnosis present

## 2015-11-15 DIAGNOSIS — J440 Chronic obstructive pulmonary disease with acute lower respiratory infection: Secondary | ICD-10-CM | POA: Diagnosis present

## 2015-11-15 DIAGNOSIS — Y95 Nosocomial condition: Secondary | ICD-10-CM | POA: Diagnosis present

## 2015-11-15 DIAGNOSIS — J9610 Chronic respiratory failure, unspecified whether with hypoxia or hypercapnia: Secondary | ICD-10-CM | POA: Diagnosis present

## 2015-11-15 DIAGNOSIS — Z882 Allergy status to sulfonamides status: Secondary | ICD-10-CM

## 2015-11-15 DIAGNOSIS — Z885 Allergy status to narcotic agent status: Secondary | ICD-10-CM | POA: Diagnosis not present

## 2015-11-15 DIAGNOSIS — Z7951 Long term (current) use of inhaled steroids: Secondary | ICD-10-CM | POA: Diagnosis not present

## 2015-11-15 DIAGNOSIS — Z881 Allergy status to other antibiotic agents status: Secondary | ICD-10-CM

## 2015-11-15 DIAGNOSIS — Z888 Allergy status to other drugs, medicaments and biological substances status: Secondary | ICD-10-CM

## 2015-11-15 DIAGNOSIS — D72829 Elevated white blood cell count, unspecified: Secondary | ICD-10-CM

## 2015-11-15 DIAGNOSIS — Z791 Long term (current) use of non-steroidal anti-inflammatories (NSAID): Secondary | ICD-10-CM

## 2015-11-15 DIAGNOSIS — Z79891 Long term (current) use of opiate analgesic: Secondary | ICD-10-CM

## 2015-11-15 DIAGNOSIS — Z87891 Personal history of nicotine dependence: Secondary | ICD-10-CM

## 2015-11-15 DIAGNOSIS — F419 Anxiety disorder, unspecified: Secondary | ICD-10-CM | POA: Diagnosis present

## 2015-11-15 DIAGNOSIS — Z7952 Long term (current) use of systemic steroids: Secondary | ICD-10-CM

## 2015-11-15 DIAGNOSIS — F329 Major depressive disorder, single episode, unspecified: Secondary | ICD-10-CM | POA: Diagnosis present

## 2015-11-15 DIAGNOSIS — R32 Unspecified urinary incontinence: Secondary | ICD-10-CM | POA: Diagnosis present

## 2015-11-15 DIAGNOSIS — J189 Pneumonia, unspecified organism: Secondary | ICD-10-CM | POA: Diagnosis present

## 2015-11-15 DIAGNOSIS — R0602 Shortness of breath: Secondary | ICD-10-CM | POA: Diagnosis not present

## 2015-11-15 HISTORY — DX: Gastro-esophageal reflux disease without esophagitis: K21.9

## 2015-11-15 HISTORY — DX: Charcot's joint, unspecified site: M14.60

## 2015-11-15 LAB — BASIC METABOLIC PANEL
ANION GAP: 9 (ref 5–15)
BUN: 12 mg/dL (ref 6–20)
CO2: 27 mmol/L (ref 22–32)
Calcium: 9.1 mg/dL (ref 8.9–10.3)
Chloride: 101 mmol/L (ref 101–111)
Creatinine, Ser: 0.44 mg/dL (ref 0.44–1.00)
GFR calc Af Amer: >60 mL/min (ref >60)
GFR calc non Af Amer: >60 mL/min (ref >60)
GLUCOSE: 98 mg/dL (ref 65–99)
POTASSIUM: 3.1 mmol/L — AB (ref 3.5–5.1)
SODIUM: 137 mmol/L (ref 135–145)

## 2015-11-15 LAB — TROPONIN I: Troponin I: <0.03 ng/mL (ref <0.03)

## 2015-11-15 LAB — CBC
HEMATOCRIT: 44.4 % (ref 35.0–47.0)
HEMOGLOBIN: 15.1 g/dL (ref 12.0–16.0)
MCH: 27.4 pg (ref 26.0–34.0)
MCHC: 34 g/dL (ref 32.0–36.0)
MCV: 80.6 fL (ref 80.0–100.0)
Platelets: 378 10*3/uL (ref 150–440)
RBC: 5.51 MIL/uL — ABNORMAL HIGH (ref 3.80–5.20)
RDW: 15.6 % — AB (ref 11.5–14.5)
WBC: 14.6 10*3/uL — AB (ref 3.6–11.0)

## 2015-11-15 LAB — LACTIC ACID, PLASMA: LACTIC ACID, VENOUS: 0.9 mmol/L (ref 0.5–1.9)

## 2015-11-15 LAB — FIBRIN DERIVATIVES D-DIMER (ARMC ONLY): FIBRIN DERIVATIVES D-DIMER (ARMC): 214 (ref 0–499)

## 2015-11-15 MED ORDER — HEPARIN SODIUM (PORCINE) 5000 UNIT/ML IJ SOLN
5000.0000 [IU] | Freq: Three times a day (TID) | INTRAMUSCULAR | Status: DC
  Administered 2015-11-16 (×2): 5000 [IU] via SUBCUTANEOUS
  Filled 2015-11-15 (×2): qty 1

## 2015-11-15 MED ORDER — MELOXICAM 7.5 MG PO TABS
7.5000 mg | ORAL_TABLET | Freq: Every day | ORAL | Status: DC
  Administered 2015-11-16 – 2015-11-17 (×2): 7.5 mg via ORAL
  Filled 2015-11-15 (×2): qty 1

## 2015-11-15 MED ORDER — ACETAMINOPHEN 325 MG PO TABS: 650.0000 mg | ORAL_TABLET | Freq: Four times a day (QID) | ORAL | Status: DC | PRN

## 2015-11-15 MED ORDER — FLUTICASONE PROPIONATE 50 MCG/ACT NA SUSP
2.0000 | Freq: Every day | NASAL | Status: DC
  Administered 2015-11-16 – 2015-11-17 (×3): 2 via NASAL
  Filled 2015-11-15: qty 16

## 2015-11-15 MED ORDER — TIOTROPIUM BROMIDE MONOHYDRATE 18 MCG IN CAPS
18.0000 ug | ORAL_CAPSULE | Freq: Every day | RESPIRATORY_TRACT | Status: DC
  Administered 2015-11-16 – 2015-11-17 (×2): 18 ug via RESPIRATORY_TRACT
  Filled 2015-11-15: qty 5

## 2015-11-15 MED ORDER — SENNOSIDES-DOCUSATE SODIUM 8.6-50 MG PO TABS: 1.0000 | ORAL_TABLET | Freq: Two times a day (BID) | ORAL | Status: DC | PRN

## 2015-11-15 MED ORDER — VANCOMYCIN HCL IN DEXTROSE 1-5 GM/200ML-% IV SOLN
1000.0000 mg | Freq: Once | INTRAVENOUS | Status: AC
  Administered 2015-11-15: 1000 mg via INTRAVENOUS
  Filled 2015-11-15: qty 200

## 2015-11-15 MED ORDER — VANCOMYCIN HCL IN DEXTROSE 1-5 GM/200ML-% IV SOLN
1000.0000 mg | Freq: Two times a day (BID) | INTRAVENOUS | Status: DC
  Administered 2015-11-15 – 2015-11-17 (×4): 1000 mg via INTRAVENOUS
  Filled 2015-11-15 (×5): qty 200

## 2015-11-15 MED ORDER — IPRATROPIUM-ALBUTEROL 0.5-2.5 (3) MG/3ML IN SOLN: 3.0000 mL | RESPIRATORY_TRACT | Status: DC | PRN

## 2015-11-15 MED ORDER — OXYCODONE HCL ER 15 MG PO T12A
15.0000 mg | EXTENDED_RELEASE_TABLET | Freq: Two times a day (BID) | ORAL | Status: DC
  Administered 2015-11-16 – 2015-11-17 (×3): 15 mg via ORAL
  Filled 2015-11-15 (×3): qty 1

## 2015-11-15 MED ORDER — ALBUTEROL SULFATE (2.5 MG/3ML) 0.083% IN NEBU
5.0000 mg | INHALATION_SOLUTION | Freq: Once | RESPIRATORY_TRACT | Status: AC
  Administered 2015-11-15: 5 mg via RESPIRATORY_TRACT
  Filled 2015-11-15: qty 6

## 2015-11-15 MED ORDER — MOMETASONE FURO-FORMOTEROL FUM 200-5 MCG/ACT IN AERO
2.0000 | INHALATION_SPRAY | Freq: Two times a day (BID) | RESPIRATORY_TRACT | Status: DC
  Administered 2015-11-16 – 2015-11-17 (×4): 2 via RESPIRATORY_TRACT
  Filled 2015-11-15: qty 8.8

## 2015-11-15 MED ORDER — PANTOPRAZOLE SODIUM 40 MG PO TBEC
40.0000 mg | DELAYED_RELEASE_TABLET | Freq: Every day | ORAL | Status: DC
Start: 2015-11-16 — End: 2015-11-17
  Administered 2015-11-16 – 2015-11-17 (×2): 40 mg via ORAL
  Filled 2015-11-15 (×2): qty 1

## 2015-11-15 MED ORDER — SODIUM CHLORIDE 0.9 % IV BOLUS (SEPSIS)
1000.0000 mL | Freq: Once | INTRAVENOUS | Status: AC
  Administered 2015-11-15: 1000 mL via INTRAVENOUS

## 2015-11-15 MED ORDER — OXYCODONE-ACETAMINOPHEN 5-325 MG PO TABS
2.0000 | ORAL_TABLET | Freq: Four times a day (QID) | ORAL | Status: DC | PRN
  Administered 2015-11-15 – 2015-11-16 (×2): 2 via ORAL
  Filled 2015-11-15 (×2): qty 2

## 2015-11-15 MED ORDER — VITAMIN D3 1.25 MG (50000 UT) PO CAPS
50000.0000 [IU] | ORAL_CAPSULE | ORAL | Status: DC
  Filled 2015-11-15: qty 1

## 2015-11-15 MED ORDER — MONTELUKAST SODIUM 10 MG PO TABS
10.0000 mg | ORAL_TABLET | Freq: Every day | ORAL | Status: DC
  Administered 2015-11-16: 22:00:00 10 mg via ORAL
  Filled 2015-11-15: qty 1

## 2015-11-15 MED ORDER — PREGABALIN 75 MG PO CAPS
75.0000 mg | ORAL_CAPSULE | Freq: Three times a day (TID) | ORAL | Status: DC
  Administered 2015-11-16: 75 mg via ORAL
  Filled 2015-11-15: qty 1

## 2015-11-15 MED ORDER — DEXTROSE 5 % IV SOLN
2.0000 g | Freq: Three times a day (TID) | INTRAVENOUS | Status: DC
  Administered 2015-11-16 – 2015-11-17 (×5): 2 g via INTRAVENOUS
  Filled 2015-11-15 (×7): qty 2

## 2015-11-15 MED ORDER — POTASSIUM CHLORIDE CRYS ER 20 MEQ PO TBCR
40.0000 meq | EXTENDED_RELEASE_TABLET | Freq: Once | ORAL | Status: AC
  Administered 2015-11-15: 40 meq via ORAL
  Filled 2015-11-15: qty 2

## 2015-11-15 MED ORDER — LORAZEPAM 0.5 MG PO TABS
0.5000 mg | ORAL_TABLET | Freq: Three times a day (TID) | ORAL | Status: DC
  Administered 2015-11-16 – 2015-11-17 (×6): 0.5 mg via ORAL
  Filled 2015-11-15 (×6): qty 1

## 2015-11-15 MED ORDER — FUROSEMIDE 20 MG PO TABS
30.0000 mg | ORAL_TABLET | Freq: Every day | ORAL | Status: DC
  Administered 2015-11-16 (×2): 30 mg via ORAL
  Filled 2015-11-15: qty 2
  Filled 2015-11-15 (×2): qty 1

## 2015-11-15 MED ORDER — TOPIRAMATE 25 MG PO TABS
50.0000 mg | ORAL_TABLET | Freq: Two times a day (BID) | ORAL | Status: DC
  Administered 2015-11-16 – 2015-11-17 (×4): 50 mg via ORAL
  Filled 2015-11-15 (×4): qty 2

## 2015-11-15 MED ORDER — DEXTROSE 5 % IV SOLN
2.0000 g | Freq: Once | INTRAVENOUS | Status: AC
  Administered 2015-11-15: 2 g via INTRAVENOUS
  Filled 2015-11-15: qty 2

## 2015-11-15 MED ORDER — SODIUM CHLORIDE 0.9 % IV BOLUS (SEPSIS)
250.0000 mL | Freq: Once | INTRAVENOUS | Status: AC
  Administered 2015-11-15: 250 mL via INTRAVENOUS

## 2015-11-15 MED ORDER — LORATADINE 10 MG PO TABS
10.0000 mg | ORAL_TABLET | Freq: Every day | ORAL | Status: DC
  Administered 2015-11-16 – 2015-11-17 (×3): 10 mg via ORAL
  Filled 2015-11-15 (×3): qty 1

## 2015-11-15 MED ORDER — BUPROPION HCL ER (XL) 300 MG PO TB24
300.0000 mg | ORAL_TABLET | Freq: Every day | ORAL | Status: DC
  Administered 2015-11-16 – 2015-11-17 (×3): 300 mg via ORAL
  Filled 2015-11-15 (×3): qty 1

## 2015-11-15 MED ORDER — VENLAFAXINE HCL 37.5 MG PO TABS
37.5000 mg | ORAL_TABLET | Freq: Two times a day (BID) | ORAL | Status: DC
  Administered 2015-11-16 – 2015-11-17 (×4): 37.5 mg via ORAL
  Filled 2015-11-15 (×4): qty 1

## 2015-11-15 NOTE — ED Triage Notes (Signed)
Pt now also reports central chest pain

## 2015-11-15 NOTE — Progress Notes (Signed)
ANTIBIOTIC CONSULT NOTE - INITIAL  Pharmacy Consult for Cefepime, Vancomycin  Indication: pneumonia  Allergies  Allergen Reactions  . Pregabalin Swelling  . Shellfish-Derived Products Swelling  . Aspirin     REACTION: Unknown reaction  . Clarithromycin     REACTION: Unkown reaction  . Codeine Other (See Comments)    Unknown reaction  . Fentanyl     REACTION: Unknown reaction  . Gabapentin     REACTION: Unknown reaction  . Methocarbamol     REACTION: Unknown reaction  . Nicotine     REACTION: Allergic to patch only Unknown reaction  . Nortriptyline Hcl     REACTION: Unknown reaction  . Nortriptyline Hcl     Other reaction(s): Unknown REACTION: Unknown reaction  . Potassium Iodide     REACTION: Unknown reaction  . Propoxyphene N-Acetaminophen     REACTION: Unknown reaction  . Sulfonamide Derivatives     REACTION: Unknown reaction  . Tramadol Hcl     REACTION: Severe headaches    Patient Measurements: Height: 5\' 1"  (154.9 cm) Weight: 206 lb 1.6 oz (93.5 kg) IBW/kg (Calculated) : 47.8 Adjusted Body Weight: 66.1 kg   Vital Signs: Temp: 98 F (36.7 C) (09/27 1346) Temp Source: Oral (09/27 1346) BP: 128/90 (09/27 1614) Pulse Rate: 114 (09/27 1614) Intake/Output from previous day: No intake/output data recorded. Intake/Output from this shift: No intake/output data recorded.  Labs:  Recent Labs  11/15/15 1409  WBC 14.6*  HGB 15.1  PLT 378  CREATININE 0.44   Estimated Creatinine Clearance: 86.8 mL/min (by C-G formula based on SCr of 0.44 mg/dL). No results for input(s): VANCOTROUGH, VANCOPEAK, VANCORANDOM, GENTTROUGH, GENTPEAK, GENTRANDOM, TOBRATROUGH, TOBRAPEAK, TOBRARND, AMIKACINPEAK, AMIKACINTROU, AMIKACIN in the last 72 hours.   Microbiology: No results found for this or any previous visit (from the past 720 hour(s)).  Medical History: Past Medical History:  Diagnosis Date  . Anemia   . Charcot's arthropathy   . COPD (chronic obstructive  pulmonary disease) (HCC)   . COPD (chronic obstructive pulmonary disease) (HCC)   . Fluid overload   . GERD (gastroesophageal reflux disease)   . MDD (major depressive disorder) (HCC)     Medications:  Scheduled:   Assessment: CrCl = 86.8 ml/min Ke = 0.076 hr-1 T1/2 = 9.12 hrs Vd = 46.3 L   Goal of Therapy:  Vancomycin trough level 15-20 mcg/ml  Plan:  Expected duration 7 days with resolution of temperature and/or normalization of WBC   Cefepime 2 gm IV X 1 given on 9/27 @ 16:00.  Cefepime 2 gm IV Q8H ordered to start on 9/28 @ 00:00.  Vancomycin 1 gm IV X 1 given on 9/27 @ 16:00. Vancomycin 1 gm IV Q12H ordered to start on 9/27 @ 22:00, ~ 6 hrs after 1st dose (stacked dosing). This pt will reach Css by 9/29 @ 16:00. Will draw 1st trough on 9/29 @ 21:30, which will be at Css.   Jacqueli Pangallo D 11/15/2015,5:05 PM

## 2015-11-15 NOTE — ED Triage Notes (Signed)
Pt c/o SHOB X 2 days with cough. Reports green productive cough. Also c/o nausea and not eating well because of it. No respiratory distress noted in triage. On 2 L [place] which is baseline.

## 2015-11-15 NOTE — ED Provider Notes (Signed)
[facility] Emergency Department Provider Note    First MD Initiated Contact with Patient 11/15/15 1400     (approximate)  I have reviewed the triage vital signs and the nursing notes.   HISTORY  Chief Complaint Shortness of Breath    HPI Melanie Berry is a 50 y.o. female presents from nursing home due to the long term care requirements with a history of COPD presenting with intermittent chest pain and worsening shortness of breath associated with green productive sputum for the past several days. Patient states the past 4 days she has been having worsening cough and shortness of breath. Denies any hemoptysis. Has felt cold and chills but no measured fevers. States the past 2 days she's had decreased oral intake associated with nausea no episodes of vomiting. Denies any dysuria. Denies any diarrhea. Is not on any blood thinners. She is chronically immobile.     Past Medical History:  Diagnosis Date  . Anemia   . Charcot's arthropathy   . COPD (chronic obstructive pulmonary disease) (HCC)   . COPD (chronic obstructive pulmonary disease) (HCC)   . Fluid overload   . GERD (gastroesophageal reflux disease)   . MDD (major depressive disorder) [facility](HCC)     Patient Active Problem List   Diagnosis Date Noted  . URINARY INCONTINENCE 07/22/2006  . CYSTITIS, ACUTE 05/13/2006  . 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 CANDIDIASIS 01/06/2006  . HYPERLIPIDEMIA 01/06/2006  . HYPOKALEMIA 01/06/2006  . TOBACCO ABUSE 01/06/2006  . DEPRESSION 01/06/2006  . 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 VENOUS INSUFFICIENCY 01/06/2006  . ALLERGIC RHINITIS 01/06/2006  . 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 TONGUE DISORDER 01/06/2006  . GERD 01/06/2006  . LOW BACK PAIN 01/06/2006  . SLEEP APNEA 01/06/2006  . LEG EDEMA 01/06/2006  . STATUS, ARTHRODESIS 01/06/2006    Past Surgical History:  Procedure Laterality Date  . APPENDECTOMY    .  CHOLECYSTECTOMY      Prior to Admission medications   Medication Sig Start Date End Date Taking? Authorizing Provider  acetaminophen (TYLENOL) 325 MG tablet Take 650 mg by mouth every 6 (six) hours as needed for moderate pain.    Historical Provider, MD  buPROPion (WELLBUTRIN XL) 300 MG 24 hr tablet Take 300 mg by mouth daily.    Historical Provider, MD  Cholecalciferol (VITAMIN D3) 50000 units CAPS Take 50,000 Units by mouth every 30 (thirty) days.    Historical Provider, MD  doxycycline (VIBRA-TABS) 100 MG tablet Take 100 mg by mouth 2 (two) times daily.    Historical Provider, MD  fexofenadine (ALLEGRA) 180 MG tablet Take 180 mg by mouth daily.    Historical Provider, MD  fluticasone (FLONASE) 50 MCG/ACT nasal spray Place 2 sprays into both nostrils daily.    Historical Provider, MD  Fluticasone-Salmeterol (ADVAIR) 250-50 MCG/DOSE AEPB Inhale 1 puff into the lungs 2 (two) times daily.    Historical Provider, MD  furosemide (LASIX) 20 MG tablet Take 30 mg by mouth at bedtime.    Historical Provider, MD  ipratropium-albuterol (DUONEB) 0.5-2.5 (3) MG/3ML SOLN Take 3 mLs by nebulization every 4 (four) hours as needed (for shortness of breathe or wheezing).    Historical Provider, MD  LORazepam (ATIVAN) 0.5 MG tablet Take 0.5 mg by mouth every 8 (eight) hours.    Historical Provider, MD  meloxicam (MOBIC) 7.5 MG tablet Take 7.5 mg by mouth daily.    Historical Provider, MD  montelukast (SINGULAIR) 10 MG tablet Take 10 mg by mouth  at bedtime.    Historical Provider, MD  omeprazole (PRILOSEC) 20 MG capsule Take 20 mg by mouth daily.    Historical Provider, MD  oxyCODONE (OXY IR/ROXICODONE) 5 MG immediate release tablet Take 5 mg by mouth every 4 (four) hours as needed for severe pain.    Historical Provider, MD  oxyCODONE (OXYCONTIN) 15 mg 12 hr tablet Take 15 mg by mouth every 12 (twelve) hours.    Historical Provider, MD  oxyCODONE-acetaminophen (PERCOCET) 5-325 MG tablet Take 2 tablets by mouth  every 6 (six) hours as needed for moderate pain or severe pain. 10/15/15   [name], MD  potassium chloride SA (K-DUR,KLOR-CON) 20 MEQ tablet Take 40 mEq by mouth daily.    Historical Provider, MD  predniSONE (DELTASONE) 10 MG tablet 50 mg daily for 4 more days 06/18/15   [name], MD  pregabalin (LYRICA) 75 MG capsule Take 75 mg by mouth every 8 (eight) hours.    Historical Provider, MD  senna-docusate (SENOKOT-S) 8.6-50 MG tablet Take 1 tablet by mouth 2 (two) times daily as needed for mild constipation.    Historical Provider, MD  tiotropium (SPIRIVA) 18 MCG inhalation capsule Place 18 mcg into inhaler and inhale daily.    Historical Provider, MD  topiramate (TOPAMAX) 50 MG tablet Take 50 mg by mouth 2 (two) times daily.    Historical Provider, MD  venlafaxine (EFFEXOR) 37.5 MG tablet Take 37.5 mg by mouth 2 (two) times daily.    Historical Provider, MD    Allergies Aspirin; Clarithromycin; Fentanyl; Gabapentin; Methocarbamol; Nicotine; Nortriptyline hcl; Potassium iodide; Propoxyphene n-acetaminophen; Sulfonamide derivatives; and Tramadol hcl  History reviewed. No pertinent family history.  Social History Social History  Substance Use Topics  . Smoking status: Former Games developer  . Smokeless tobacco: Never Used  . Alcohol use No    Review of Systems Patient denies headaches, rhinorrhea, blurry vision, numbness, shortness of breath, chest pain, edema, cough, abdominal pain, nausea, vomiting, diarrhea, dysuria, fevers, rashes or hallucinations unless otherwise stated above in HPI. ____________________________________________   PHYSICAL EXAM:  VITAL SIGNS: Vitals:   11/15/15 1346  BP: (!) 92/45  Pulse: (!) 123  Resp: 20  Temp: 98 F (36.7 C)    Constitutional: Alert and oriented. Chronically ill appearing in moderate respiratory distress. Eyes: Conjunctivae are normal. PERRL. EOMI. Head: Atraumatic. Nose: No congestion/rhinnorhea. Mouth/Throat: Mucous membranes  are dry.  Oropharynx non-erythematous. Neck: No stridor. Painless ROM. No cervical spine tenderness to palpation Hematological/Lymphatic/Immunilogical: No cervical lymphadenopathy. Cardiovascular: Normal rate, regular rhythm. Grossly normal heart sounds.  Good peripheral circulation. Respiratory: Tachypnea, prolonged expiratory phase, respiratory crackles in posterior bases left greater than right. Gastrointestinal: Soft and nontender. No distention. No abdominal bruits. No CVA tenderness. Musculoskeletal: Bilateral lower extremity edema with chronic venous stasis and pressure ulcers. No evidence of surrounding erythema or purulence..  Neurologic:  Normal speech and language. No gross focal neurologic deficits are appreciated. No gait instability. Skin:  Skin is warm, dry and intact. No rash noted. Psychiatric: Mood and affect are normal. Speech and behavior are normal.  ____________________________________________   LABS (all labs ordered are listed, but only abnormal results are displayed)  Results for orders placed or performed during the hospital encounter of 11/15/15 (from the past 24 hour(s))  Basic metabolic panel     Status: Abnormal   Collection Time: 11/15/15  2:09 PM  Result Value Ref Range   Sodium 137 135 - 145 mmol/L   Potassium 3.1 (L) 3.5 - 5.1 mmol/L  Chloride 101 101 - 111 mmol/L   CO2 27 22 - 32 mmol/L   Glucose, Bld 98 65 - 99 mg/dL   BUN 12 6 - 20 mg/dL   Creatinine, Ser 4.54 0.44 - 1.00 mg/dL   Calcium 9.1 8.9 - 09.8 mg/dL   GFR calc non Af Amer >60 >60 mL/min   GFR calc Af Amer >60 >60 mL/min   Anion gap 9 5 - 15  CBC     Status: Abnormal   Collection Time: 11/15/15  2:09 PM  Result Value Ref Range   WBC 14.6 (H) 3.6 - 11.0 K/uL   RBC 5.51 (H) 3.80 - 5.20 MIL/uL   Hemoglobin 15.1 12.0 - 16.0 g/dL   HCT 11.9 14.7 - 82.9 %   MCV 80.6 80.0 - 100.0 fL   MCH 27.4 26.0 - 34.0 pg   MCHC 34.0 32.0 - 36.0 g/dL   RDW 56.2 (H) 13.0 - 86.5 %   Platelets 378 150 -  440 K/uL  Troponin I     Status: None   Collection Time: 11/15/15  2:09 PM  Result Value Ref Range   Troponin I <0.03 <0.03 ng/mL  Fibrin derivatives D-Dimer ([facility] only)     Status: None   Collection Time: 11/15/15  2:09 PM  Result Value Ref Range   Fibrin derivatives D-dimer ([facility]) 214 0 - 499   ____________________________________________  EKG My review and personal interpretation at Time: 13:56   Indication: chest pain  Rate: 120  Rhythm: sinus Axis: normal Other: nonspecific t wave changes, no acute elevations or depressions ____________________________________________  RADIOLOGY  CXR IMPRESSION: Low lung volumes. Nonspecific new patchy opacity at the posterior left lung base, which could represent atelectasis, aspiration or pneumonia. Recommend follow-up PA and lateral post treatment chest radiographs in 4-6 weeks. ____________________________________________   PROCEDURES  Procedure(s) performed: none    Critical Care performed: yes CRITICAL CARE Performed by: Willy Eddy   Total critical care time: 40 minutes  Critical care time was exclusive of separately billable procedures and treating other patients.  Critical care was necessary to treat or prevent imminent or life-threatening deterioration.  Critical care was time spent personally by me on the following activities: development of treatment plan with patient and/or surrogate as well as nursing, discussions with consultants, evaluation of patient's response to treatment, examination of patient, obtaining history from patient or surrogate, ordering and performing treatments and interventions, ordering and review of laboratory studies, ordering and review of radiographic studies, pulse oximetry and re-evaluation of patient's condition.  ____________________________________________   INITIAL IMPRESSION / ASSESSMENT AND PLAN / ED COURSE  Pertinent labs & imaging results that were available during my care  of the patient were reviewed by me and considered in my medical decision making (see chart for details).  HQI:ONGEXB, copd, CHF, pna, ptx, malignancy, Pe, anemia   Melanie Berry is a 50 y.o. who presents to the ED with complaint of chest pain and shortness of breath with tachycardia and hypotension. Patient is afebrile and requiring increase of her middle oxygen in addition to her home O2 requirement of 2 L nasal cannula. Presentation is concerning for healthcare associated pneumonia. As patient is chronically immobile and was complaining of left leg pain previously will further evaluate for PE with a d-dimer as she has reported allergy to contrast dye.Maryclare Labrador start empiric antibiotics and nebulizer treatments.  The patient will be placed on continuous pulse oximetry and telemetry for monitoring.  Laboratory evaluation will be sent to  evaluate for the above complaints.     Clinical Course  Value Comment By Time  Fibrin derivatives D-dimer [facility]): 214 (Reviewed) Willy Eddy, MD 09/27 1607   Discussed case with the patient and as his d-dimer is negative I do feel this presentation is more consistent with healthcare associated pneumonia. Heart rate is improving with IV fluids. Will be very gentle with IV fluid resuscitation she does have a history of congestive heart failure and pulmonary edema. I will not give 30cc/kg of IVF due to concern for worsening of her respiratory distress. I spoke with Dr. Madelon Lips regarding her presentation and he, agrees to admit patient for further evaluation and management. Willy Eddy, MD 09/27 1610     ____________________________________________   FINAL CLINICAL IMPRESSION(S) / ED DIAGNOSES  Final diagnoses:  HCAP (healthcare-associated pneumonia)  Leukocytosis  Tachycardia      NEW MEDICATIONS STARTED DURING THIS VISIT:  New Prescriptions   No medications on file     Note:  This document was prepared using Dragon voice recognition  software and may include unintentional dictation errors.    Willy Eddy, MD 11/15/15 1620

## 2015-11-15 NOTE — H&P (Signed)
[facility] [place] at [facility]   PATIENT NAME: Melanie Berry    MR#:  [id number]  DATE OF BIRTH:  02-04-1966  DATE OF ADMISSION:  11/15/2015  PRIMARY CARE PHYSICIAN: Derwood Kaplan, MD   REQUESTING/REFERRING PHYSICIAN: Roxan Hockey  CHIEF COMPLAINT:   Chief Complaint  Patient presents with  . Shortness of Breath    HISTORY OF PRESENT ILLNESS: Melanie Berry  is a 50 y.o. female with a known history of Charcot marie teeth disease, disabled and lives in NH, COPD, on Oxygen, have cough and SOB with chest pain for last 2-3 days. Found to have pneumonia on Xray in er.  PAST MEDICAL HISTORY:   Past Medical History:  Diagnosis Date  . Anemia   . Charcot's arthropathy   . COPD (chronic obstructive pulmonary disease) (HCC)   . COPD (chronic obstructive pulmonary disease) (HCC)   . Fluid overload   . GERD (gastroesophageal reflux disease)   . MDD (major depressive disorder) (HCC)     PAST SURGICAL HISTORY: Past Surgical History:  Procedure Laterality Date  . APPENDECTOMY    . CHOLECYSTECTOMY      SOCIAL HISTORY:  Social History  Substance Use Topics  . Smoking status: Former Games developer  . Smokeless tobacco: Never Used  . Alcohol use No    FAMILY HISTORY:  Family History  Problem Relation Age of Onset  . Family history unknown: Yes    DRUG ALLERGIES:  Allergies  Allergen Reactions  . Pregabalin Swelling  . Shellfish-Derived Products Swelling  . Aspirin     REACTION: Unknown reaction  . Clarithromycin     REACTION: Unkown reaction  . Codeine Other (See Comments)    Unknown reaction  . Fentanyl     REACTION: Unknown reaction  . Gabapentin     REACTION: Unknown reaction  . Methocarbamol     REACTION: Unknown reaction  . Nicotine     REACTION: Allergic to patch only Unknown reaction  . Nortriptyline Hcl     REACTION: Unknown reaction  . Nortriptyline Hcl     Other reaction(s): Unknown REACTION: Unknown reaction  . Potassium Iodide      REACTION: Unknown reaction  . Propoxyphene N-Acetaminophen     REACTION: Unknown reaction  . Sulfonamide Derivatives     REACTION: Unknown reaction  . Tramadol Hcl     REACTION: Severe headaches    REVIEW OF SYSTEMS:   CONSTITUTIONAL: No fever, fatigue or weakness.  EYES: No blurred or double vision.  EARS, NOSE, AND THROAT: No tinnitus or ear pain.  RESPIRATORY: positive for cough, shortness of breath, no wheezing or hemoptysis.  CARDIOVASCULAR: No chest pain, orthopnea, edema.  GASTROINTESTINAL: No nausea, vomiting, diarrhea or abdominal pain.  GENITOURINARY: No dysuria, hematuria.  ENDOCRINE: No polyuria, nocturia,  HEMATOLOGY: No anemia, easy bruising or bleeding SKIN: No rash or lesion. MUSCULOSKELETAL: No joint pain or arthritis.   NEUROLOGIC: No tingling, numbness, weakness.  PSYCHIATRY: No anxiety or depression.   MEDICATIONS AT HOME:  Prior to Admission medications   Medication Sig Start Date End Date Taking? Authorizing Provider  fexofenadine-pseudoephedrine (ALLEGRA-D ALLERGY & CONGESTION) 60-120 MG 12 hr tablet Take 1 tablet by mouth 2 (two) times daily.   Yes Historical Provider, MD  acetaminophen (TYLENOL) 325 MG tablet Take 650 mg by mouth every 6 (six) hours as needed for moderate pain.    Historical Provider, MD  buPROPion (WELLBUTRIN XL) 300 MG 24 hr tablet Take 300 mg by mouth daily.  Historical Provider, MD  Cholecalciferol (VITAMIN D3) 50000 units CAPS Take 50,000 Units by mouth every 30 (thirty) days.    Historical Provider, MD  doxycycline (VIBRA-TABS) 100 MG tablet Take 100 mg by mouth 2 (two) times daily.    Historical Provider, MD  fluticasone (FLONASE) 50 MCG/ACT nasal spray Place 2 sprays into both nostrils daily.    Historical Provider, MD  Fluticasone-Salmeterol (ADVAIR) 250-50 MCG/DOSE AEPB Inhale 1 puff into the lungs 2 (two) times daily.    Historical Provider, MD  furosemide (LASIX) 20 MG tablet Take 30 mg by mouth at bedtime.    Historical  Provider, MD  ipratropium-albuterol (DUONEB) 0.5-2.5 (3) MG/3ML SOLN Take 3 mLs by nebulization every 4 (four) hours as needed (for shortness of breathe or wheezing).    Historical Provider, MD  LORazepam (ATIVAN) 0.5 MG tablet Take 0.5 mg by mouth every 8 (eight) hours.    Historical Provider, MD  meloxicam (MOBIC) 7.5 MG tablet Take 7.5 mg by mouth daily.    Historical Provider, MD  montelukast (SINGULAIR) 10 MG tablet Take 10 mg by mouth at bedtime.    Historical Provider, MD  omeprazole (PRILOSEC) 20 MG capsule Take 20 mg by mouth daily.    Historical Provider, MD  oxyCODONE (OXY IR/ROXICODONE) 5 MG immediate release tablet Take 5 mg by mouth every 4 (four) hours as needed for severe pain.    Historical Provider, MD  oxyCODONE (OXYCONTIN) 15 mg 12 hr tablet Take 15 mg by mouth every 12 (twelve) hours.    Historical Provider, MD  oxyCODONE-acetaminophen (PERCOCET) 5-325 MG tablet Take 2 tablets by mouth every 6 (six) hours as needed for moderate pain or severe pain. 10/15/15   [name], MD  potassium chloride SA (K-DUR,KLOR-CON) 20 MEQ tablet Take 40 mEq by mouth daily.    Historical Provider, MD  predniSONE (DELTASONE) 10 MG tablet 50 mg daily for 4 more days 06/18/15   [name], MD  pregabalin (LYRICA) 75 MG capsule Take 75 mg by mouth every 8 (eight) hours.    Historical Provider, MD  senna-docusate (SENOKOT-S) 8.6-50 MG tablet Take 1 tablet by mouth 2 (two) times daily as needed for mild constipation.    Historical Provider, MD  tiotropium (SPIRIVA) 18 MCG inhalation capsule Place 18 mcg into inhaler and inhale daily.    Historical Provider, MD  topiramate (TOPAMAX) 50 MG tablet Take 50 mg by mouth 2 (two) times daily.    Historical Provider, MD  venlafaxine (EFFEXOR) 37.5 MG tablet Take 37.5 mg by mouth 2 (two) times daily.    Historical Provider, MD      PHYSICAL EXAMINATION:   VITAL SIGNS: Blood pressure 128/90, pulse (!) 114, temperature 98 F (36.7 C), temperature  source Oral, resp. rate 20, height 5\' 1"  (1.549 m), weight 93.5 kg (206 lb 1.6 oz), SpO2 93 %.  GENERAL:  51 y.o.-year-old patient lying in the bed with no acute distress.  EYES: Pupils equal, round, reactive to light and accommodation. No scleral icterus. Extraocular muscles intact.  HEENT: Head atraumatic, normocephalic. Oropharynx and nasopharynx clear.  NECK:  Supple, no jugular venous distention. No thyroid enlargement, no tenderness.  LUNGS: Normal breath sounds bilaterally, no wheezing, some crepitation. No use of accessory muscles of respiration.   On 2 ltr oxygen supplementation.  CARDIOVASCULAR: S1, S2 normal. No murmurs, rubs, or gallops.  ABDOMEN: Soft, nontender, nondistended. Bowel sounds present. No organomegaly or mass.  EXTREMITIES: No pedal edema, cyanosis, or clubbing.  NEUROLOGIC: Cranial nerves II  through XII are intact. Muscle strength 5/5 in all extremities. Sensation intact. Gait not checked.  PSYCHIATRIC: The patient is alert and oriented x 3.  SKIN: No obvious rash, lesion, or ulcer.   LABORATORY PANEL:   CBC  Recent Labs Lab 11/15/15 1409  WBC 14.6*  HGB 15.1  HCT 44.4  PLT 378  MCV 80.6  MCH 27.4  MCHC 34.0  RDW 15.6*   ------------------------------------------------------------------------------------------------------------------  Chemistries   Recent Labs Lab 11/15/15 1409  NA 137  K 3.1*  CL 101  CO2 27  GLUCOSE 98  BUN 12  CREATININE 0.44  CALCIUM 9.1   ------------------------------------------------------------------------------------------------------------------ estimated creatinine clearance is 86.8 mL/min (by C-G formula based on SCr of 0.44 mg/dL). ------------------------------------------------------------------------------------------------------------------ No results for input(s): TSH, T4TOTAL, T3FREE, THYROIDAB in the last 72 hours.  Invalid input(s): FREET3   Coagulation profile No results for input(s): INR,  PROTIME in the last 168 hours. ------------------------------------------------------------------------------------------------------------------- No results for input(s): DDIMER in the last 72 hours. -------------------------------------------------------------------------------------------------------------------  Cardiac Enzymes  Recent Labs Lab 11/15/15 1409  TROPONINI <0.03   ------------------------------------------------------------------------------------------------------------------ Invalid input(s): POCBNP  ---------------------------------------------------------------------------------------------------------------  Urinalysis    Component Value Date/Time   COLORURINE AMBER (A) 05/03/2015 2145   APPEARANCEUR CLOUDY (A) 05/03/2015 2145   APPEARANCEUR Cloudy 10/25/2013 0810   LABSPEC 1.017 05/03/2015 2145   LABSPEC 1.005 10/25/2013 0810   PHURINE 6.0 05/03/2015 2145   GLUCOSEU NEGATIVE 05/03/2015 2145   GLUCOSEU Negative 10/25/2013 0810   GLUCOSEU NEG mg/dL 16/10/960408/08/2006 54092208   HGBUR 2+ (A) 05/03/2015 2145   BILIRUBINUR NEGATIVE 05/03/2015 2145   BILIRUBINUR Negative 10/25/2013 0810   KETONESUR NEGATIVE 05/03/2015 2145   PROTEINUR NEGATIVE 05/03/2015 2145   UROBILINOGEN 0.2 09/25/2006 2208   NITRITE POSITIVE (A) 05/03/2015 2145   LEUKOCYTESUR 2+ (A) 05/03/2015 2145   LEUKOCYTESUR 2+ 10/25/2013 0810     RADIOLOGY: Dg Chest 2 View  Result Date: 11/15/2015 CLINICAL DATA:  Shortness of breath EXAM: CHEST  2 VIEW COMPARISON:  06/18/2015 chest radiograph. FINDINGS: Low lung volumes. Stable cardiomediastinal silhouette with normal heart size. No pneumothorax. No pleural effusion. No pulmonary edema. There is new patchy opacity at the posterior left lung base on the lateral view. Cholecystectomy clips are seen in the right upper quadrant of the abdomen. IMPRESSION: Low lung volumes. Nonspecific new patchy opacity at the posterior left lung base, which could represent  atelectasis, aspiration or pneumonia. Recommend follow-up PA and lateral post treatment chest radiographs in 4-6 weeks. Electronically Signed   By: Delbert PhenixJason A Poff M.D.   On: 11/15/2015 14:43    EKG: Orders placed or performed during the hospital encounter of 11/15/15  . EKG 12-Lead  . EKG 12-Lead  . ED EKG within 10 minutes  . ED EKG within 10 minutes    IMPRESSION AND PLAN:  * Pneumonia   IV cefepime and vanc as she is from NH   MRSA screen and sputum cx.   Bl cx sent  * COPD , ch respi failure   On Home o2, no wheezing   COnt Home inhalers and nebs  * Ch pain   Cont pain meds  * anxiety   Cont lorazepam   All the records are reviewed and case discussed with ED provider. Management plans discussed with the patient, family and they are in agreement.  CODE STATUS: Full code. Code Status History    This patient does not have a recorded code status. Please follow your organizational policy for patients in this situation.  TOTAL TIME TAKING CARE OF THIS PATIENT: 50 minutes.    Altamese Dilling M.D on 11/15/2015   Between 7am to 6pm - Pager - [phone]  After 6pm go to [email or web address] - password [email or web address]  [facility]  Office  [phone]  CC: Primary care physician; Derwood Kaplan, MD   Note: This dictation was prepared with Dragon dictation along with smaller phrase technology. Any transcriptional errors that result from this process are unintentional.

## 2015-11-15 NOTE — ED Notes (Signed)
Patient transported to X-ray 

## 2015-11-16 LAB — BASIC METABOLIC PANEL
ANION GAP: 5 (ref 5–15)
BUN: 10 mg/dL (ref 6–20)
CHLORIDE: 105 mmol/L (ref 101–111)
CO2: 27 mmol/L (ref 22–32)
Calcium: 8.7 mg/dL — ABNORMAL LOW (ref 8.9–10.3)
Creatinine, Ser: 0.4 mg/dL — ABNORMAL LOW (ref 0.44–1.00)
GFR calc non Af Amer: >60 mL/min (ref >60)
Glucose, Bld: 95 mg/dL (ref 65–99)
Potassium: 3.2 mmol/L — ABNORMAL LOW (ref 3.5–5.1)
Sodium: 137 mmol/L (ref 135–145)

## 2015-11-16 LAB — URINALYSIS COMPLETE WITH MICROSCOPIC (ARMC ONLY)
BILIRUBIN URINE: NEGATIVE
Bacteria, UA: NONE SEEN
GLUCOSE, UA: NEGATIVE mg/dL
HGB URINE DIPSTICK: NEGATIVE
LEUKOCYTES UA: NEGATIVE
NITRITE: POSITIVE — AB
Protein, ur: NEGATIVE mg/dL
RBC / HPF: NONE SEEN RBC/hpf (ref 0–5)
SPECIFIC GRAVITY, URINE: 1.01 (ref 1.005–1.030)
Squamous Epithelial / LPF: NONE SEEN
pH: 6 (ref 5.0–8.0)

## 2015-11-16 LAB — CBC
HCT: 41.6 % (ref 35.0–47.0)
HEMOGLOBIN: 13.8 g/dL (ref 12.0–16.0)
MCH: 27.2 pg (ref 26.0–34.0)
MCHC: 33.2 g/dL (ref 32.0–36.0)
MCV: 81.9 fL (ref 80.0–100.0)
Platelets: 328 10*3/uL (ref 150–440)
RBC: 5.08 MIL/uL (ref 3.80–5.20)
RDW: 15.7 % — ABNORMAL HIGH (ref 11.5–14.5)
WBC: 12.6 10*3/uL — ABNORMAL HIGH (ref 3.6–11.0)

## 2015-11-16 LAB — MRSA PCR SCREENING: MRSA by PCR: POSITIVE — AB

## 2015-11-16 MED ORDER — ENSURE ENLIVE PO LIQD
237.0000 mL | Freq: Two times a day (BID) | ORAL | Status: DC
  Administered 2015-11-17: 10:00:00 237 mL via ORAL

## 2015-11-16 MED ORDER — ACETAMINOPHEN 325 MG PO TABS: 650.0000 mg | ORAL_TABLET | Freq: Four times a day (QID) | ORAL | Status: DC | PRN

## 2015-11-16 MED ORDER — ONDANSETRON HCL 4 MG PO TABS
4.0000 mg | ORAL_TABLET | Freq: Once | ORAL | Status: AC
  Administered 2015-11-16: 02:00:00 4 mg via ORAL
  Filled 2015-11-16: qty 1

## 2015-11-16 MED ORDER — POTASSIUM CHLORIDE CRYS ER 20 MEQ PO TBCR
40.0000 meq | EXTENDED_RELEASE_TABLET | Freq: Once | ORAL | Status: AC
  Administered 2015-11-16: 16:00:00 40 meq via ORAL
  Filled 2015-11-16: qty 2

## 2015-11-16 NOTE — Progress Notes (Signed)
While rounding CH made initial visit with Pt. After introduction, asked what I could do for her. She asked for prayer. Was conflicted between her long time faith and an introduction of a new faith. This along with her illness was the cause of great anxiety. Asked for a prayer of peace and calmness. I provided prayer and the ministry of empathetic listening. Ch is available for follow up as needed.    11/16/15 1600  Clinical Encounter Type  Visited With Patient  Visit Type Initial;Spiritual support  Referral From Nurse  Spiritual Encounters  Spiritual Needs Prayer;Emotional  Stress Factors  Patient Stress Factors Health changes

## 2015-11-16 NOTE — Progress Notes (Signed)
[facility] - [place] at [facility]   PATIENT NAME: Melanie CaddyLeanne Dorff    MR#:  [id number]  DATE OF BIRTH:  04/10/1965  SUBJECTIVE:  CHIEF COMPLAINT:   Chief Complaint  Patient presents with  . Shortness of Breath   NH resident came with pneumonia. Broad coverage, MRSA screen positive. No sputum sample yet. Have c/o incontinence and obstruction with urine, ordered in and out cath. REVIEW OF SYSTEMS:  CONSTITUTIONAL: No fever, fatigue or weakness.  EYES: No blurred or double vision.  EARS, NOSE, AND THROAT: No tinnitus or ear pain.  RESPIRATORY: No cough, shortness of breath, wheezing or hemoptysis.  CARDIOVASCULAR: No chest pain, orthopnea, edema.  GASTROINTESTINAL: No nausea, vomiting, diarrhea or abdominal pain.  GENITOURINARY: No dysuria, hematuria.  ENDOCRINE: No polyuria, nocturia,  HEMATOLOGY: No anemia, easy bruising or bleeding SKIN: No rash or lesion. MUSCULOSKELETAL: No joint pain or arthritis.   NEUROLOGIC: No tingling, numbness, weakness.  PSYCHIATRY: No anxiety or depression.   ROS  DRUG ALLERGIES:   Allergies  Allergen Reactions  . Pregabalin Swelling  . Shellfish-Derived Products Swelling  . Aspirin     REACTION: Unknown reaction  . Clarithromycin     REACTION: Unkown reaction  . Codeine Other (See Comments)    Unknown reaction  . Fentanyl     REACTION: Unknown reaction  . Gabapentin     REACTION: Unknown reaction  . Methocarbamol     REACTION: Unknown reaction  . Nicotine     REACTION: Allergic to patch only Unknown reaction  . Nortriptyline Hcl     REACTION: Unknown reaction  . Nortriptyline Hcl     Other reaction(s): Unknown REACTION: Unknown reaction  . Potassium Iodide     REACTION: Unknown reaction  . Propoxyphene N-Acetaminophen     REACTION: Unknown reaction  . Sulfonamide Derivatives     REACTION: Unknown reaction  . Tramadol Hcl     REACTION: Severe headaches    VITALS:  Blood pressure (!) 98/50, pulse (!) 104,  temperature 98 F (36.7 C), temperature source Oral, resp. rate 18, height 5\' 4"  (1.626 m), weight 91.1 kg (200 lb 14.4 oz), SpO2 97 %.  PHYSICAL EXAMINATION:  GENERAL:  50 y.o.-year-old patient lying in the bed with no acute distress.  EYES: Pupils equal, round, reactive to light and accommodation. No scleral icterus. Extraocular muscles intact.  HEENT: Head atraumatic, normocephalic. Oropharynx and nasopharynx clear.  NECK:  Supple, no jugular venous distention. No thyroid enlargement, no tenderness.  LUNGS: Normal breath sounds bilaterally, no wheezing, some crepitation. No use of accessory muscles of respiration.  CARDIOVASCULAR: S1, S2 normal. No murmurs, rubs, or gallops.  ABDOMEN: Soft, nontender, nondistended. Bowel sounds present. No organomegaly or mass.  EXTREMITIES: No pedal edema, cyanosis, or clubbing.  NEUROLOGIC: Cranial nerves II through XII are intact. Muscle strength 4/5 in upper extremities 2/5 in lower extrimities , with chronic disuse atrophy and flexions on all fingers and foot. Sensation intact. Gait not checked.  PSYCHIATRIC: The patient is alert and oriented x 3.  SKIN: No obvious rash, lesion, or ulcer.   Physical Exam LABORATORY PANEL:   CBC  Recent Labs Lab 11/16/15 0407  WBC 12.6*  HGB 13.8  HCT 41.6  PLT 328   ------------------------------------------------------------------------------------------------------------------  Chemistries   Recent Labs Lab 11/16/15 0407  NA 137  K 3.2*  CL 105  CO2 27  GLUCOSE 95  BUN 10  CREATININE 0.40*  CALCIUM 8.7*   ------------------------------------------------------------------------------------------------------------------  Cardiac Enzymes  Recent Labs Lab  11/15/15 1409  TROPONINI <0.03   ------------------------------------------------------------------------------------------------------------------  RADIOLOGY:  Dg Chest 2 View  Result Date: 11/15/2015 CLINICAL DATA:  Shortness of  breath EXAM: CHEST  2 VIEW COMPARISON:  06/18/2015 chest radiograph. FINDINGS: Low lung volumes. Stable cardiomediastinal silhouette with normal heart size. No pneumothorax. No pleural effusion. No pulmonary edema. There is new patchy opacity at the posterior left lung base on the lateral view. Cholecystectomy clips are seen in the right upper quadrant of the abdomen. IMPRESSION: Low lung volumes. Nonspecific new patchy opacity at the posterior left lung base, which could represent atelectasis, aspiration or pneumonia. Recommend follow-up PA and lateral post treatment chest radiographs in 4-6 weeks. Electronically Signed   By: Delbert Phenix M.D.   On: 11/15/2015 14:43    ASSESSMENT AND PLAN:   Principal Problem:   Pneumonia  * Pneumonia   IV cefepime and vanc as she is from NH   MRSA screen and sputum cx.   Bl cx sent  * COPD , ch respi failure   On Home o2, no wheezing   COnt Home inhalers and nebs  * Ch pain   Cont pain meds  * anxiety   Cont lorazepam  * urine incontinence, UTI   In an out catheter as needed   UA is positive, [place] cx sent.   On cefepime.  All the records are reviewed and case discussed with Care Management/Social Workerr. Management plans discussed with the patient, family and they are in agreement.  CODE STATUS: Full.  TOTAL TIME TAKING CARE OF THIS PATIENT: 35 minutes.     POSSIBLE D/C IN 1-2 DAYS, DEPENDING ON CLINICAL CONDITION.   Altamese Dilling M.D on 11/16/2015   Between 7am to 6pm - Pager - [phone]  After 6pm go to [email or web address] - password [email or web address]  [facility] [facility]  Office  [phone]  CC: Primary care physician; Derwood Kaplan, MD  Note: This dictation was prepared with Dragon dictation along with smaller phrase technology. Any transcriptional errors that result from this process are unintentional.

## 2015-11-16 NOTE — NC FL2 (Signed)
[place] MEDICAID FL2 LEVEL OF CARE SCREENING TOOL     IDENTIFICATION  Patient Name: Melanie Berry Birthdate: 04/13/1965 Sex: female Admission Date (Current Location): 11/15/2015  [place]:  [place] [id number] [facility] Facility and Address:  [facility], [address], [place], [place] [address]      Provider Number: [id number]  Attending Physician Name and Address:  Altamese DillingVaibhavkumar Vachhani, MD  Relative Name and Phone Number:       Current Level of Care: Hospital Recommended Level of Care: Skilled Nursing Facility Prior Approval Number:    Date Approved/Denied:   PASRR Number: [id number]  Discharge Plan: SNF    Current Diagnoses: Patient Active Problem List   Diagnosis Date Noted  . Pneumonia 11/15/2015  . URINARY INCONTINENCE 07/22/2006  . CYSTITIS, ACUTE 05/13/2006  . 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 CANDIDIASIS 01/06/2006  . HYPERLIPIDEMIA 01/06/2006  . HYPOKALEMIA 01/06/2006  . TOBACCO ABUSE 01/06/2006  . DEPRESSION 01/06/2006  . 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 VENOUS INSUFFICIENCY 01/06/2006  . ALLERGIC RHINITIS 01/06/2006  . 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 TONGUE DISORDER 01/06/2006  . GERD 01/06/2006  . LOW BACK PAIN 01/06/2006  . SLEEP APNEA 01/06/2006  . LEG EDEMA 01/06/2006  . STATUS, ARTHRODESIS 01/06/2006    Orientation RESPIRATION BLADDER Height & Weight     Self, Time, Situation, Place  O2 (Nasal Cannula 3 L/min) Incontinent Weight: 200 lb 14.4 oz (91.1 kg) Height:  5\' 4"  (162.6 cm)  BEHAVIORAL SYMPTOMS/MOOD NEUROLOGICAL BOWEL NUTRITION STATUS   (None.)  (None.) Continent Diet (Regular)  AMBULATORY STATUS COMMUNICATION OF NEEDS Skin   Limited Assist Verbally Normal                       Personal Care Assistance Level of Assistance  Bathing, Feeding, Dressing Bathing Assistance: Limited assistance Feeding assistance: Independent Dressing Assistance:  Limited assistance     Functional Limitations Info  Sight, Hearing, Speech Sight Info: Adequate Hearing Info: Adequate Speech Info: Adequate    SPECIAL CARE FACTORS FREQUENCY  PT (By licensed PT)     PT Frequency: 5              Contractures      Additional Factors Info  Code Status, Allergies, Isolation Precautions Code Status Info:  (Full Code) Allergies Info:  ( Pregabalin, Shellfish-derived Products, Aspirin, Clarithromycin, Codeine, Fentanyl, Gabapentin, Methocarbamol, Nicotine, Nortriptyline Hcl, Nortriptyline Hcl, Potassium Iodide, Propoxyphene N-acetaminophen, Sulfonamide Derivatives, Tramadol Hcl)     Isolation Precautions Info:  (Contact Precaution: MRSA)     Current Medications (11/16/2015):  This is the current hospital active medication list Current Facility-Administered Medications  Medication Dose Route Frequency Provider Last Rate Last Dose  . acetaminophen (TYLENOL) tablet 650 mg  650 mg Oral Q6H PRN Altamese DillingVaibhavkumar Vachhani, MD      . buPROPion (WELLBUTRIN XL) 24 hr tablet 300 mg  300 mg Oral Daily Altamese DillingVaibhavkumar Vachhani, MD   300 mg at 11/16/15 0911  . ceFEPIme (MAXIPIME) 2 g in dextrose 5 % 50 mL IVPB  2 g Intravenous Q8H Altamese DillingVaibhavkumar Vachhani, MD   2 g at 11/16/15 0909  . feeding supplement (ENSURE ENLIVE) (ENSURE ENLIVE) liquid 237 mL  237 mL Oral BID BM Altamese DillingVaibhavkumar Vachhani, MD      . fluticasone (FLONASE) 50 MCG/ACT nasal spray 2 spray  2 spray Each Nare Daily Altamese DillingVaibhavkumar Vachhani, MD   2 spray at 11/16/15 0912  . furosemide (LASIX) tablet 30 mg  30 mg Oral QHS  Altamese Dilling, MD   30 mg at 11/16/15 0254  . heparin injection 5,000 Units  5,000 Units Subcutaneous Q8H Altamese Dilling, MD   5,000 Units at 11/16/15 0910  . ipratropium-albuterol (DUONEB) 0.5-2.5 (3) MG/3ML nebulizer solution 3 mL  3 mL Nebulization Q4H PRN Altamese Dilling, MD      . loratadine (CLARITIN) tablet 10 mg  10 mg Oral Daily Altamese Dilling, MD   10 mg at  11/16/15 0910  . LORazepam (ATIVAN) tablet 0.5 mg  0.5 mg Oral Q8H Altamese Dilling, MD   0.5 mg at 11/16/15 0659  . meloxicam (MOBIC) tablet 7.5 mg  7.5 mg Oral Daily Altamese Dilling, MD   7.5 mg at 11/16/15 0910  . mometasone-formoterol (DULERA) 200-5 MCG/ACT inhaler 2 puff  2 puff Inhalation BID Altamese Dilling, MD   2 puff at 11/16/15 0829  . montelukast (SINGULAIR) tablet 10 mg  10 mg Oral QHS Altamese Dilling, MD      . oxyCODONE (OXYCONTIN) 12 hr tablet 15 mg  15 mg Oral Q12H Altamese Dilling, MD   15 mg at 11/16/15 0911  . oxyCODONE-acetaminophen (PERCOCET/ROXICET) 5-325 MG per tablet 2 tablet  2 tablet Oral Q6H PRN Altamese Dilling, MD   2 tablet at 11/15/15 1743  . pantoprazole (PROTONIX) EC tablet 40 mg  40 mg Oral Daily Altamese Dilling, MD   40 mg at 11/16/15 0911  . senna-docusate (Senokot-S) tablet 1 tablet  1 tablet Oral BID PRN Altamese Dilling, MD      . tiotropium (SPIRIVA) inhalation capsule 18 mcg  18 mcg Inhalation Daily Altamese Dilling, MD   18 mcg at 11/16/15 0257  . topiramate (TOPAMAX) tablet 50 mg  50 mg Oral BID Altamese Dilling, MD   50 mg at 11/16/15 0911  . vancomycin (VANCOCIN) IVPB 1000 mg/200 mL premix  1,000 mg Intravenous Q12H Altamese Dilling, MD   1,000 mg at 11/16/15 1022  . venlafaxine (EFFEXOR) tablet 37.5 mg  37.5 mg Oral BID Altamese Dilling, MD   37.5 mg at 11/16/15 0911  . Vitamin D3 CAPS 50,000 Units  50,000 Units Oral Q30 days Altamese Dilling, MD         Discharge Medications: Please see discharge summary for a list of discharge medications.  Relevant Imaging Results:  Relevant Lab Results:   Additional Information  (SSN: [id number])  Verta Ellen Raiquan Chandler, LCSW

## 2015-11-16 NOTE — Plan of Care (Signed)
Problem: Physical Regulation: Goal: Will remain free from infection Outcome: Progressing Pt receiving IV antibiotics.  Problem: Activity: Goal: Risk for activity intolerance will decrease Outcome: Not Progressing Pt is bed bound   

## 2015-11-16 NOTE — Progress Notes (Signed)
Urine specimen ordered however pt is incontinent. Received order for an in an out cath in order to obtain urine specimen. Pt tolerated well. Otilio Jefferson, RN

## 2015-11-16 NOTE — Progress Notes (Signed)
Initial Nutrition Assessment  DOCUMENTATION CODES:   Not applicable  INTERVENTION:  -Will ask kitchen to chop all meats.  Pt did not want diet changed to dysphagia 3 as able to chew raw foods.   -Recommend Ensure Enlive po BID, each supplement provides 350 kcal and 20 grams of protein with noted wt loss.    NUTRITION DIAGNOSIS:   Inadequate oral intake related to acute illness as evidenced by per patient/family report.    GOAL:   Patient will meet greater than or equal to 90% of their needs    MONITOR:   PO intake, Weight trends  REASON FOR ASSESSMENT:   Malnutrition Screening Tool    ASSESSMENT:      50 y.o female admitted with pneumonia, shortness of breath.  Noted nausea for few days prior to admission. Pt with history of charcot's arthropathy, COPD, GERD, MDD, anemia  Pt reports intake was decreased for 2 days prior to admission secondary to nausea.  Reports ate good breakfast this am, eating fries and hamburger for lunch but having trouble chewing.  Medications reviewed: lasix, protonix, vit D 3 Labs reviewed: K 3.2, creatinine 0.40  Nutrition-Focused physical exam completed. Findings are no  fat depletion, no muscle depletion, and mild edema.     Diet Order:  Diet regular Room service appropriate? Yes; Fluid consistency: Thin  Skin:  Reviewed, no issues  Last BM:  PTA  Height:   Ht Readings from Last 1 Encounters:  11/15/15 5\' 4"  (1.626 m)    Weight: Noted 8% wt loss in the last month per wt encounters.    Wt Readings from Last 1 Encounters:  11/15/15 200 lb 14.4 oz (91.1 kg)    Ideal Body Weight:     BMI:  Body mass index is 34.48 kg/m.  Estimated Nutritional Needs:   Kcal:  1510-1900 kcals/d  Protein:  91-109 g/d  Fluid:  >/= 1.5 L/d  EDUCATION NEEDS:   No education needs identified at this time  Manila Rommel B. Freida BusmanAllen, RD, LDN [phone] (pager) Weekend/On-Call pager [phone])

## 2015-11-16 NOTE — Clinical Social Work Note (Signed)
Clinical Social Work Assessment  Patient Details  Name: Melanie Berry MRN: [id number] Date of Birth: [birth year]  Date of referral:  11/16/15               Reason for consult:  Discharge Planning                Permission sought to share information with:    Permission granted to share information::     Name::        Agency::     Relationship::     Contact Information:     Housing/Transportation Living arrangements for the past 2 months:  [facility]) Source of Information:  Patient Patient Interpreter Needed:  None Criminal Activity/Legal Involvement Pertinent to Current Situation/Hospitalization:  No - Comment as needed Significant Relationships:  Other Family Members Lives with:  Facility Resident [facility]) Do you feel safe going back to the place where you live?  Yes Need for family participation in patient care:  No (Coment)  Care giving concerns:  Patient is a LTC resident at [facility])   Social Worker assessment / plan:  CSW met with patient at bedside. Introduced herself and her role. Per patient she's lived at [facility] since Feb. Stated that she's interested in going to [facility]. CSW informed patient that [facility] may not have a LTC Medicaid bed available. Patient reported in the event that they don't have a bed available she would return to [facility]. Stated she wanted to a least see if [facility] would accept her. Granted CSW verbal permission to send LTC referral to [facility] and to coordinate discharge with [facility]. FL2/ PASRR completed. CSW left voicemail for [employer]- Development worker, international aid at [facility]. Awaiting phone call back. CSW spoke to Banker at [employer]. Per Roderic Palau patient can return at discharge. CSW will continue to follow and assist.   Employment status:  Disabled (Comment on whether or not currently receiving Disability) Insurance information:  Medicaid In  [place] PT Recommendations:  Not assessed at this time Information / Referral to community resources:     Patient/Family's Response to care:  Patient would life for CSW to see if [facility] has availability for LTC. Patient has agreed to return to [facility] if [facility] isn't able to accept her.   Patient/Family's Understanding of and Emotional Response to Diagnosis, Current Treatment, and Prognosis:  Patient reports she understands her Diagnosis, Current Treatment, and Prognosis. Thanked CSW for assistance.   Emotional Assessment Appearance:  Appears stated age Attitude/Demeanor/Rapport:   (None) Affect (typically observed):  Accepting, Calm, Pleasant Orientation:  Oriented to Self, Oriented to Place, Oriented to  Time, Oriented to Situation Alcohol / Substance use:  Not Applicable Psych involvement (Current and /or in the community):  No (Comment)  Discharge Needs  Concerns to be addressed:  Discharge Planning Concerns Readmission within the last 30 days:  No Current discharge risk:  Chronically ill Barriers to Discharge:  Continued Medical Work up   [employer], LCSW 11/16/2015, 4:45 PM

## 2015-11-17 LAB — URINE CULTURE: Culture: NO GROWTH

## 2015-11-17 MED ORDER — OXYCODONE HCL ER 15 MG PO T12A: 15.0000 mg | EXTENDED_RELEASE_TABLET | Freq: Two times a day (BID) | ORAL | 0 refills | Status: DC

## 2015-11-17 MED ORDER — VENLAFAXINE HCL 37.5 MG PO TABS: 37.5000 mg | ORAL_TABLET | Freq: Two times a day (BID) | ORAL | 0 refills | Status: DC

## 2015-11-17 MED ORDER — MELOXICAM 7.5 MG PO TABS: 7.5000 mg | ORAL_TABLET | Freq: Every day | ORAL | 0 refills | Status: DC

## 2015-11-17 MED ORDER — CEPHALEXIN 250 MG PO CAPS: 250.0000 mg | ORAL_CAPSULE | Freq: Three times a day (TID) | ORAL | 0 refills | Status: DC

## 2015-11-17 MED ORDER — DOXYCYCLINE HYCLATE 50 MG PO CAPS: 50.0000 mg | ORAL_CAPSULE | Freq: Two times a day (BID) | ORAL | 0 refills | Status: DC

## 2015-11-17 MED ORDER — OXYCODONE-ACETAMINOPHEN 5-325 MG PO TABS: 2.0000 | ORAL_TABLET | Freq: Four times a day (QID) | ORAL | 0 refills | Status: DC | PRN

## 2015-11-17 NOTE — Consult Note (Signed)
WOC Nurse wound consult note Reason for Consult: Bilateral compression wrap change Wound type: Venous stasis wounds bilateral lower extremities  Measurements: Right anterior leg has two distinct areas.  The superior wound measures 4.5 cm x 6.4 cm.  The wound bed is 100% clean, no odor, and re-epithelialization along the wound margins is occurring.  The inferior wound measures 4.2 cm x 5 cm.  The wound be has the same characteristics as the other.  Left anterior lower leg has a total of 7 open areas.  All are 100% pink with re-epithelialization and absent of odor.  The largest is the most distal and measures 3 cm x 5 cm.  All areas with minimal bright red bleeding with dressing change.  The patient stated all areas are significantly improved.  Periwound for all: Intact skin  Dressing procedure/placement/frequency: Unna boot 3 layer compression wrap applied to both lower legs.  The initial layer was zinc impregnated gauze, covered by kerlex, then 4 inch wide Coban applied at 50% overlap and 50% stretch.  Dressings will need to be changed in one week.  Patient was very pleased that she did not experience pain during the dressing changes.  All questions answered to her expressed satisfaction.  Discussed POC with patient and bedside nurse.  Re consult if needed, will not follow at this time. Thanks Helmut Muster MSN, RN, CNS-BC, [employer]

## 2015-11-17 NOTE — Plan of Care (Signed)
MD making rounds. Order received to discharge back to [facility]. IV removed. Report called to Marylene Land, nurse at [facility]. No unanswered questions. EMS called for transport. EMS on Unit for transport. Discharge packet given to EMS. Belongings sent with patient and EMS.

## 2015-11-17 NOTE — Discharge Instructions (Signed)
In-out catheter for urinary obstruction 3-4 times a day as needed. Advise to follow with urology clinic in 1-2 weeks.

## 2015-11-17 NOTE — Progress Notes (Signed)
Clinical Social Worker was informed that patient will be medically ready to discharge to [facility]. Patient in a agreement with plan. CSW called [place]- Admissions at [facility] to confirm that patient's bed is ready. Provided patient's room number 72A and number to call for report [phone] . All discharge information faxed to [facility] via [place].    Call to patient's son- Barbara Cower, left message to inform her patient would discharge to [facility]. RN will call report and patient will discharge to [facility] via EMS.  Woodroe Mode, MSW, LCSW, LCAS-A Clinical Social Worker [phone]

## 2015-11-17 NOTE — Progress Notes (Signed)
CSW informed patient that [facility] is not able to accept patient due to not having any Medicaid beds at this time. Patient in agreement to return to [facility]). CSW called [facility] Admissions. Awaiting phone call back.    Woodroe Mode, MSW, LCSW, LCAS-A Clinical Social Worker [phone]

## 2015-11-17 NOTE — Discharge Summary (Signed)
[facility] -  at [facility]   PATIENT NAME: Melanie Berry    MR#:  [id number]  DATE OF BIRTH:  [birth year]  DATE OF ADMISSION:  11/15/2015 ADMITTING PHYSICIAN: Altamese Dilling, MD  DATE OF DISCHARGE: 11/17/2015  PRIMARY CARE PHYSICIAN: Derwood Kaplan, MD    ADMISSION DIAGNOSIS:  Leukocytosis [D72.829] Tachycardia [R00.0] HCAP (healthcare-associated pneumonia) [J18.9]  DISCHARGE DIAGNOSIS:  Principal Problem:   Pneumonia   UTI  SECONDARY DIAGNOSIS:   Past Medical History:  Diagnosis Date  . Anemia   . Charcot's arthropathy   . COPD (chronic obstructive pulmonary disease) (HCC)   . COPD (chronic obstructive pulmonary disease) (HCC)   . Fluid overload   . GERD (gastroesophageal reflux disease)   . MDD (major depressive disorder) [facility])     HOSPITAL COURSE:   * Pneumonia IV cefepime and vanc as she is from [place] MRSA screen positive, sputum culture not sent. Bl cx sent- negative.   Give oral doxycycline on d/c.  * COPD , ch respi failure On Home o2, no wheezing COnt Home inhalers and nebs  * Ch pain Cont pain meds  * anxiety Cont lorazepam  * urine incontinence, UTI   In an out catheter as needed   UA is positive, [place] cx sent.   On cefepime.   Give keflex on d/c.   Need to follow with Urology clinic.  DISCHARGE CONDITIONS:   Stable.  CONSULTS OBTAINED:    DRUG ALLERGIES:   Allergies  Allergen Reactions  . Pregabalin Swelling  . Shellfish-Derived Products Swelling  . Aspirin     REACTION: Unknown reaction  . Clarithromycin     REACTION: Unkown reaction  . Codeine Other (See Comments)    Unknown reaction  . Fentanyl     REACTION: Unknown reaction  . Gabapentin     REACTION: Unknown reaction  . Methocarbamol     REACTION: Unknown reaction  . Nicotine     REACTION: Allergic to patch only Unknown reaction  . Nortriptyline Hcl     REACTION: Unknown reaction  . Nortriptyline Hcl      Other reaction(s): Unknown REACTION: Unknown reaction  . Potassium Iodide     REACTION: Unknown reaction  . Propoxyphene N-Acetaminophen     REACTION: Unknown reaction  . Sulfonamide Derivatives     REACTION: Unknown reaction  . Tramadol Hcl     REACTION: Severe headaches    DISCHARGE MEDICATIONS:   Current Discharge Medication List    START taking these medications   Details  cephALEXin (KEFLEX) 250 MG capsule Take 1 capsule (250 mg total) by mouth 3 (three) times daily. Qty: 9 capsule, Refills: 0    doxycycline (VIBRAMYCIN) 50 MG capsule Take 1 capsule (50 mg total) by mouth 2 (two) times daily. Qty: 14 capsule, Refills: 0      CONTINUE these medications which have CHANGED   Details  meloxicam (MOBIC) 7.5 MG tablet Take 1 tablet (7.5 mg total) by mouth daily. Qty: 20 tablet, Refills: 0    oxyCODONE (OXYCONTIN) 15 mg 12 hr tablet Take 1 tablet (15 mg total) by mouth every 12 (twelve) hours. Qty: 14 tablet, Refills: 0    oxyCODONE-acetaminophen (PERCOCET) 5-325 MG tablet Take 2 tablets by mouth every 6 (six) hours as needed for moderate pain or severe pain. Qty: 20 tablet, Refills: 0    venlafaxine (EFFEXOR) 37.5 MG tablet Take 1 tablet (37.5 mg total) by mouth 2 (two) times daily. Qty: 20 tablet, Refills: 0  CONTINUE these medications which have NOT CHANGED   Details  acetaminophen (TYLENOL) 325 MG tablet Take 650 mg by mouth every 6 (six) hours as needed for moderate pain.    ARIPiprazole (ABILIFY) 2 MG tablet Take 2 mg by mouth daily.    buPROPion (WELLBUTRIN XL) 300 MG 24 hr tablet Take 300 mg by mouth daily.    Cholecalciferol (VITAMIN D3) 50000 units CAPS Take 50,000 Units by mouth every 30 (thirty) days.    Diphenhyd-Hydrocort-Nystatin (FIRST-DUKES MOUTHWASH) SUSP Use as directed 5 mLs in the mouth or throat 4 (four) times daily. For 21 days.    fexofenadine-pseudoephedrine (ALLEGRA-D ALLERGY & CONGESTION) 60-120 MG 12 hr tablet Take 1 tablet by mouth 2  (two) times daily.    fluticasone (FLONASE) 50 MCG/ACT nasal spray Place 2 sprays into both nostrils daily.    Fluticasone-Salmeterol (ADVAIR) 250-50 MCG/DOSE AEPB Inhale 1 puff into the lungs 2 (two) times daily.    furosemide (LASIX) 20 MG tablet Take 30 mg by mouth 2 (two) times daily.     guaifenesin (ROBITUSSIN) 100 MG/5ML syrup Take 200 mg by mouth every 6 (six) hours as needed for cough. X 3 days    ipratropium-albuterol (DUONEB) 0.5-2.5 (3) MG/3ML SOLN Take 3 mLs by nebulization every 4 (four) hours as needed (for shortness of breathe or wheezing).    ketoconazole (NIZORAL) 2 % cream Apply 1 application topically daily. For 21 days for yeast. *Apply to left abdomen fold*    loperamide (IMODIUM) 2 MG capsule Take 2 mg by mouth as needed for diarrhea or loose stools.    LORazepam (ATIVAN) 0.5 MG tablet Take 0.5 mg by mouth every 8 (eight) hours as needed for anxiety.     lubiprostone (AMITIZA) 24 MCG capsule Take 24 mcg by mouth daily. Take with food    Menthol, Topical Analgesic, (BIOFREEZE EX) Apply 1 application topically every 8 (eight) hours as needed. For pain. *apply to shoulder*    menthol-cetylpyridinium (CEPACOL) 3 MG lozenge Take 1 lozenge by mouth every hour as needed for sore throat.    montelukast (SINGULAIR) 10 MG tablet Take 10 mg by mouth at bedtime.    omeprazole (PRILOSEC) 20 MG capsule Take 20 mg by mouth daily.    potassium chloride SA (K-DUR,KLOR-CON) 20 MEQ tablet Take 40 mEq by mouth daily.    promethazine (PHENERGAN) 25 MG tablet Take 25 mg by mouth every 6 (six) hours as needed for nausea or vomiting.    senna (SENOKOT) 8.6 MG tablet Take 8.6 mg by mouth 2 (two) times daily.    tiotropium (SPIRIVA) 18 MCG inhalation capsule Place 18 mcg into inhaler and inhale daily.    topiramate (TOPAMAX) 50 MG tablet Take 50 mg by mouth 2 (two) times daily.    traZODone (DESYREL) 100 MG tablet Take 100 mg by mouth at bedtime.      STOP taking these  medications     HYDROcodone-acetaminophen (NORCO/VICODIN) 5-325 MG tablet      oxyCODONE (OXY IR/ROXICODONE) 5 MG immediate release tablet      predniSONE (DELTASONE) 10 MG tablet      senna-docusate (SENOKOT-S) 8.6-50 MG tablet          DISCHARGE INSTRUCTIONS:    Follow with urology clinic.  If you experience worsening of your admission symptoms, develop shortness of breath, life threatening emergency, suicidal or homicidal thoughts you must seek medical attention immediately by calling 911 or calling your MD immediately  if symptoms less severe.  You Must read  complete instructions/literature along with all the possible adverse reactions/side effects for all the Medicines you take and that have been prescribed to you. Take any new Medicines after you have completely understood and accept all the possible adverse reactions/side effects.   Please note  You were cared for by a hospitalist during your hospital stay. If you have any questions about your discharge medications or the care you received while you were in the hospital after you are discharged, you can call the unit and asked to speak with the hospitalist on call if the hospitalist that took care of you is not available. Once you are discharged, your primary care physician will handle any further medical issues. Please note that NO REFILLS for any discharge medications will be authorized once you are discharged, as it is imperative that you return to your primary care physician (or establish a relationship with a primary care physician if you do not have one) for your aftercare needs so that they can reassess your need for medications and monitor your lab values.    Today   CHIEF COMPLAINT:   Chief Complaint  Patient presents with  . Shortness of Breath    HISTORY OF PRESENT ILLNESS:  Roxine CaddyLeanne Borenstein  is a 50 y.o. female with a known history of Charcot marie teeth disease, disabled and lives in NH, COPD, on Oxygen, have  cough and SOB with chest pain for last 2-3 days. Found to have pneumonia on Xray in er.   VITAL SIGNS:  Blood pressure (!) 105/52, pulse 85, temperature 97.8 F (36.6 C), temperature source Oral, resp. rate 17, height 5\' 4"  (1.626 m), weight 91.1 kg (200 lb 14.4 oz), SpO2 100 %.  I/O:   Intake/Output Summary (Last 24 hours) at 11/17/15 1022 Last data filed at 11/17/15 0700  Gross per 24 hour  Intake              342 ml  Output              818 ml  Net             -476 ml    PHYSICAL EXAMINATION:   GENERAL:  50 y.o.-year-old patient lying in the bed with no acute distress.  EYES: Pupils equal, round, reactive to light and accommodation. No scleral icterus. Extraocular muscles intact.  HEENT: Head atraumatic, normocephalic. Oropharynx and nasopharynx clear.  NECK:  Supple, no jugular venous distention. No thyroid enlargement, no tenderness.  LUNGS: Normal breath sounds bilaterally, no wheezing, some crepitation. No use of accessory muscles of respiration.  CARDIOVASCULAR: S1, S2 normal. No murmurs, rubs, or gallops.  ABDOMEN: Soft, nontender, nondistended. Bowel sounds present. No organomegaly or mass.  EXTREMITIES: No pedal edema, cyanosis, or clubbing.  NEUROLOGIC: Cranial nerves II through XII are intact. Muscle strength 4/5 in upper extremities 2/5 in lower extrimities , with chronic disuse atrophy and flexions on all fingers and foot. Sensation intact. Gait not checked.  PSYCHIATRIC: The patient is alert and oriented x 3.  SKIN: No obvious rash, lesion, or ulcer.   DATA REVIEW:   CBC  Recent Labs Lab 11/16/15 0407  WBC 12.6*  HGB 13.8  HCT 41.6  PLT 328    Chemistries   Recent Labs Lab 11/16/15 0407  NA 137  K 3.2*  CL 105  CO2 27  GLUCOSE 95  BUN 10  CREATININE 0.40*  CALCIUM 8.7*    Cardiac Enzymes  Recent Labs Lab 11/15/15 1409  TROPONINI <0.03  Microbiology Results  Results for orders placed or performed during the hospital encounter of  11/15/15  Blood culture (routine x 2)     Status: None (Preliminary result)   Collection Time: 11/15/15  4:53 PM  Result Value Ref Range Status   Specimen Description BLOOD L FA   Final   Special Requests   Final    BOTTLES DRAWN AEROBIC AND ANAEROBIC  AER 13 ML ANA 6 ML   Culture NO GROWTH 2 DAYS  Final   Report Status PENDING  Incomplete  MRSA PCR Screening     Status: Abnormal   Collection Time: 11/15/15 10:59 PM  Result Value Ref Range Status   MRSA by PCR POSITIVE (A) NEGATIVE Final    Comment: RESULT CALLED TO, READ BACK BY AND VERIFIED WITH: JACKIE PAGE ON 11/16/15 AT 0115 BY TLB        The GeneXpert MRSA Assay (FDA approved for NASAL specimens only), is one component of a comprehensive MRSA colonization surveillance program. It is not intended to diagnose MRSA infection nor to guide or monitor treatment for MRSA infections.     RADIOLOGY:  Dg Chest 2 View  Result Date: 11/15/2015 CLINICAL DATA:  Shortness of breath EXAM: CHEST  2 VIEW COMPARISON:  06/18/2015 chest radiograph. FINDINGS: Low lung volumes. Stable cardiomediastinal silhouette with normal heart size. No pneumothorax. No pleural effusion. No pulmonary edema. There is new patchy opacity at the posterior left lung base on the lateral view. Cholecystectomy clips are seen in the right upper quadrant of the abdomen. IMPRESSION: Low lung volumes. Nonspecific new patchy opacity at the posterior left lung base, which could represent atelectasis, aspiration or pneumonia. Recommend follow-up PA and lateral post treatment chest radiographs in 4-6 weeks. Electronically Signed   By: Delbert Phenix M.D.   On: 11/15/2015 14:43    EKG:   Orders placed or performed during the hospital encounter of 11/15/15  . EKG 12-Lead  . EKG 12-Lead  . ED EKG within 10 minutes  . ED EKG within 10 minutes      Management plans discussed with the patient, family and they are in agreement.  CODE STATUS:     Code Status Orders         Start     Ordered   11/15/15 2125  Full code  Continuous     11/15/15 2125    Code Status History    Date Active Date Inactive Code Status Order ID Comments User Context   This patient has a current code status but no historical code status.      TOTAL TIME TAKING CARE OF THIS PATIENT: 35 minutes.    Altamese Dilling M.D on 11/17/2015 at 10:22 AM  Between 7am to 6pm - Pager - [phone]  After 6pm go to [email or web address] - password [email or web address]  [facility]  Office  [phone]  CC: Primary care physician; Derwood Kaplan, MD   Note: This dictation was prepared with Dragon dictation along with smaller phrase technology. Any transcriptional errors that result from this process are unintentional.

## 2015-11-20 LAB — CULTURE, BLOOD (ROUTINE X 2): Culture: NO GROWTH

## 2015-11-20 LAB — LEGIONELLA PNEUMOPHILA SEROGP 1 UR AG: L. pneumophila Serogp 1 Ur Ag: NEGATIVE

## 2015-11-21 LAB — CULTURE, BLOOD (ROUTINE X 2): CULTURE: NO GROWTH

## 2015-11-22 ENCOUNTER — Encounter (INDEPENDENT_AMBULATORY_CARE_PROVIDER_SITE_OTHER): Payer: Self-pay

## 2015-11-29 ENCOUNTER — Encounter (INDEPENDENT_AMBULATORY_CARE_PROVIDER_SITE_OTHER): Payer: Medicaid Other

## 2015-12-06 ENCOUNTER — Ambulatory Visit (INDEPENDENT_AMBULATORY_CARE_PROVIDER_SITE_OTHER): Payer: Medicaid Other | Admitting: Vascular Surgery

## 2015-12-06 ENCOUNTER — Encounter (INDEPENDENT_AMBULATORY_CARE_PROVIDER_SITE_OTHER): Payer: Self-pay | Admitting: Vascular Surgery

## 2015-12-06 VITALS — BP 134/82 | HR 82 | Ht 66.0 in | Wt 208.0 lb

## 2015-12-06 DIAGNOSIS — L97201 Non-pressure chronic ulcer of unspecified calf limited to breakdown of skin: Secondary | ICD-10-CM | POA: Diagnosis not present

## 2015-12-06 DIAGNOSIS — I70242 Atherosclerosis of native arteries of left leg with ulceration of calf: Secondary | ICD-10-CM

## 2015-12-06 DIAGNOSIS — I70232 Atherosclerosis of native arteries of right leg with ulceration of calf: Secondary | ICD-10-CM

## 2015-12-06 NOTE — Progress Notes (Signed)
History of Present Illness  There is no documented history at this time  Assessments & Plan   There are no diagnoses linked to this encounter.    Additional instructions  Subjective:  Patient presents with venous ulcer of the Bilateral lower extremity.    Procedure:  3 layer unna wrap was placed Bilateral lower extremity.   Plan:   Follow up in one week.  

## 2015-12-10 ENCOUNTER — Emergency Department: Payer: Medicaid Other

## 2015-12-10 ENCOUNTER — Encounter: Payer: Self-pay | Admitting: Emergency Medicine

## 2015-12-10 ENCOUNTER — Emergency Department
Admission: EM | Admit: 2015-12-10 | Discharge: 2015-12-10 | Disposition: A | Discharge status: Home / Self Care | Payer: Medicaid Other | Attending: Emergency Medicine | Admitting: Emergency Medicine

## 2015-12-10 DIAGNOSIS — Z791 Long term (current) use of non-steroidal anti-inflammatories (NSAID): Secondary | ICD-10-CM | POA: Diagnosis not present

## 2015-12-10 DIAGNOSIS — R1030 Lower abdominal pain, unspecified: Secondary | ICD-10-CM | POA: Diagnosis present

## 2015-12-10 DIAGNOSIS — K5641 Fecal impaction: Secondary | ICD-10-CM | POA: Insufficient documentation

## 2015-12-10 DIAGNOSIS — Z87891 Personal history of nicotine dependence: Secondary | ICD-10-CM | POA: Insufficient documentation

## 2015-12-10 DIAGNOSIS — J449 Chronic obstructive pulmonary disease, unspecified: Secondary | ICD-10-CM | POA: Insufficient documentation

## 2015-12-10 DIAGNOSIS — R1084 Generalized abdominal pain: Secondary | ICD-10-CM

## 2015-12-10 LAB — COMPREHENSIVE METABOLIC PANEL
ALT: 9 U/L — ABNORMAL LOW (ref 14–54)
ANION GAP: 7 (ref 5–15)
AST: 15 U/L (ref 15–41)
Albumin: 2.8 g/dL — ABNORMAL LOW (ref 3.5–5.0)
Alkaline Phosphatase: 65 U/L (ref 38–126)
BUN: 8 mg/dL (ref 6–20)
CHLORIDE: 101 mmol/L (ref 101–111)
CO2: 30 mmol/L (ref 22–32)
Calcium: 8.5 mg/dL — ABNORMAL LOW (ref 8.9–10.3)
Creatinine, Ser: 0.41 mg/dL — ABNORMAL LOW (ref 0.44–1.00)
Glucose, Bld: 94 mg/dL (ref 65–99)
POTASSIUM: 3.7 mmol/L (ref 3.5–5.1)
Sodium: 138 mmol/L (ref 135–145)
Total Bilirubin: 0.7 mg/dL (ref 0.3–1.2)
Total Protein: 6 g/dL — ABNORMAL LOW (ref 6.5–8.1)

## 2015-12-10 LAB — URINALYSIS COMPLETE WITH MICROSCOPIC (ARMC ONLY)
BILIRUBIN URINE: NEGATIVE
Glucose, UA: NEGATIVE mg/dL
Hgb urine dipstick: NEGATIVE
KETONES UR: NEGATIVE mg/dL
NITRITE: POSITIVE — AB
PROTEIN: NEGATIVE mg/dL
SPECIFIC GRAVITY, URINE: 1.011 (ref 1.005–1.030)
pH: 5 (ref 5.0–8.0)

## 2015-12-10 LAB — CBC WITH DIFFERENTIAL/PLATELET
Basophils Absolute: 0.1 10*3/uL (ref 0–0.1)
Basophils Relative: 1 %
Eosinophils Absolute: 0.4 10*3/uL (ref 0–0.7)
Eosinophils Relative: 6 %
HCT: 35.2 % (ref 35.0–47.0)
HEMOGLOBIN: 11.8 g/dL — AB (ref 12.0–16.0)
LYMPHS ABS: 1.8 10*3/uL (ref 1.0–3.6)
LYMPHS PCT: 23 %
MCH: 28.5 pg (ref 26.0–34.0)
MCHC: 33.7 g/dL (ref 32.0–36.0)
MCV: 84.7 fL (ref 80.0–100.0)
Monocytes Absolute: 0.5 10*3/uL (ref 0.2–0.9)
Monocytes Relative: 6 %
NEUTROS PCT: 64 %
Neutro Abs: 5.2 10*3/uL (ref 1.4–6.5)
Platelets: 412 10*3/uL (ref 150–440)
RBC: 4.16 MIL/uL (ref 3.80–5.20)
RDW: 15.7 % — ABNORMAL HIGH (ref 11.5–14.5)
WBC: 8 10*3/uL (ref 3.6–11.0)

## 2015-12-10 LAB — LIPASE, BLOOD: LIPASE: 12 U/L (ref 11–51)

## 2015-12-10 LAB — LACTIC ACID, PLASMA: Lactic Acid, Venous: 1.1 mmol/L (ref 0.5–1.9)

## 2015-12-10 MED ORDER — POLYETHYLENE GLYCOL 3350 17 GM/SCOOP PO POWD: 1.0000 | Freq: Once | ORAL | 0 refills | Status: AC

## 2015-12-10 MED ORDER — MAGNESIUM CITRATE PO SOLN
1.0000 | Freq: Once | ORAL | Status: DC
  Filled 2015-12-10: qty 296

## 2015-12-10 MED ORDER — ACETAMINOPHEN 325 MG PO TABS
650.0000 mg | ORAL_TABLET | Freq: Once | ORAL | Status: AC
  Administered 2015-12-10: 650 mg via ORAL
  Filled 2015-12-10: qty 2

## 2015-12-10 MED ORDER — OXYCODONE-ACETAMINOPHEN 5-325 MG PO TABS
1.0000 | ORAL_TABLET | Freq: Once | ORAL | Status: AC
  Administered 2015-12-10: 1 via ORAL

## 2015-12-10 MED ORDER — OXYCODONE-ACETAMINOPHEN 5-325 MG PO TABS
ORAL_TABLET | ORAL | Status: AC
  Filled 2015-12-10: qty 1

## 2015-12-10 MED ORDER — SORBITOL 70 % SOLN
960.0000 mL | TOPICAL_OIL | Freq: Once | ORAL | Status: AC
  Administered 2015-12-10: 960 mL via RECTAL
  Filled 2015-12-10: qty 240

## 2015-12-10 MED ORDER — BARIUM SULFATE 2.1 % PO SUSP: 450.0000 mL | ORAL | Status: AC

## 2015-12-10 MED ORDER — IOPAMIDOL (ISOVUE-300) INJECTION 61%: 30.0000 mL | Freq: Once | INTRAVENOUS | Status: DC | PRN

## 2015-12-10 MED ORDER — SODIUM CHLORIDE 0.9 % IV BOLUS (SEPSIS)
1000.0000 mL | Freq: Once | INTRAVENOUS | Status: AC
  Administered 2015-12-10: 1000 mL via INTRAVENOUS

## 2015-12-10 NOTE — ED Provider Notes (Signed)
[facility] Emergency Department Provider Note ____________________________________________   I have reviewed the triage vital signs and the triage nursing note.  HISTORY  Chief Complaint Rectal Pain   Historian Patient  HPI Melanie Berry is a 50 y.o. female with a history of Charcot Marie tooth disease as well as COPD for which she wears 3 L home O2 and stays at [facility], presents today with what sounds like maybe just 1 day or 2 days worth of frequent bowel movements although she states they are not watery or bloody, and rectal pain as well as lower abdominal pain. She states it is somewhat intermittent, but this morning it was constant. She states her pain is 10 out of 10. Denies fever. Denies nausea or vomiting. No chest pain or trouble breathing. She states that her blood pressure normally runs low about 90/60.    Past Medical History:  Diagnosis Date  . Anemia   . Charcot's arthropathy   . COPD (chronic obstructive pulmonary disease) (HCC)   . COPD (chronic obstructive pulmonary disease) (HCC)   . Fluid overload   . GERD (gastroesophageal reflux disease)   . MDD (major depressive disorder)     Patient Active Problem List   Diagnosis Date Noted  . Pneumonia 11/15/2015  . URINARY INCONTINENCE 07/22/2006  . CYSTITIS, ACUTE 05/13/2006  . 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 CANDIDIASIS 01/06/2006  . HYPERLIPIDEMIA 01/06/2006  . HYPOKALEMIA 01/06/2006  . TOBACCO ABUSE 01/06/2006  . DEPRESSION 01/06/2006  . 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 VENOUS INSUFFICIENCY 01/06/2006  . ALLERGIC RHINITIS 01/06/2006  . 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 TONGUE DISORDER 01/06/2006  . GERD 01/06/2006  . LOW BACK PAIN 01/06/2006  . SLEEP APNEA 01/06/2006  . LEG EDEMA 01/06/2006  . STATUS, ARTHRODESIS 01/06/2006    Past Surgical History:  Procedure Laterality Date  . APPENDECTOMY    .  CHOLECYSTECTOMY      Prior to Admission medications   Medication Sig Start Date End Date Taking? Authorizing Provider  acetaminophen (TYLENOL) 325 MG tablet Take 650 mg by mouth every 6 (six) hours as needed for moderate pain.   Yes Historical Provider, MD  buPROPion (WELLBUTRIN XL) 300 MG 24 hr tablet Take 300 mg by mouth daily.   Yes Historical Provider, MD  Cholecalciferol (VITAMIN D3) 50000 units CAPS Take 50,000 Units by mouth every 30 (thirty) days.   Yes Historical Provider, MD  fexofenadine-pseudoephedrine (ALLEGRA-D ALLERGY & CONGESTION) 60-120 MG 12 hr tablet Take 1 tablet by mouth 2 (two) times daily.   Yes Historical Provider, MD  fluticasone (FLONASE) 50 MCG/ACT nasal spray Place 2 sprays into both nostrils daily.   Yes Historical Provider, MD  Fluticasone-Salmeterol (ADVAIR) 250-50 MCG/DOSE AEPB Inhale 1 puff into the lungs 2 (two) times daily.   Yes Historical Provider, MD  furosemide (LASIX) 20 MG tablet Take 30 mg by mouth 2 (two) times daily.    Yes Historical Provider, MD  loperamide (IMODIUM) 2 MG capsule Take 2 mg by mouth as needed for diarrhea or loose stools.   Yes Historical Provider, MD  LORazepam (ATIVAN) 0.5 MG tablet Take 0.5 mg by mouth every 8 (eight) hours as needed for anxiety.    Yes Historical Provider, MD  lubiprostone (AMITIZA) 24 MCG capsule Take 24 mcg by mouth daily. Take with food   Yes Historical Provider, MD  meloxicam (MOBIC) 7.5 MG tablet Take 1 tablet (7.5 mg total) by mouth daily. 11/17/15  Yes Altamese DillingVaibhavkumar Vachhani,  MD  Menthol, Topical Analgesic, (BIOFREEZE EX) Apply 1 application topically every 8 (eight) hours as needed. Apply to left shoulder for pain   Yes Historical Provider, MD  menthol-cetylpyridinium (CEPACOL) 3 MG lozenge Take 1 lozenge by mouth every hour as needed for sore throat.   Yes Historical Provider, MD  montelukast (SINGULAIR) 10 MG tablet Take 10 mg by mouth at bedtime.   Yes Historical Provider, MD  omeprazole (PRILOSEC) 20 MG  capsule Take 20 mg by mouth daily.   Yes Historical Provider, MD  oxyCODONE (OXY IR/ROXICODONE) 5 MG immediate release tablet Take 5 mg by mouth every 4 (four) hours as needed for severe pain.   Yes Historical Provider, MD  oxyCODONE (OXYCONTIN) 15 mg 12 hr tablet Take 1 tablet (15 mg total) by mouth every 12 (twelve) hours. 11/17/15  Yes Altamese Dilling, MD  oxyCODONE-acetaminophen (PERCOCET) 5-325 MG tablet Take 2 tablets by mouth every 6 (six) hours as needed for moderate pain or severe pain. 11/17/15  Yes Altamese Dilling, MD  potassium chloride SA (K-DUR,KLOR-CON) 20 MEQ tablet Take 40 mEq by mouth daily.   Yes Historical Provider, MD  promethazine (PHENERGAN) 25 MG tablet Take 25 mg by mouth every 6 (six) hours as needed for nausea or vomiting.   Yes Historical Provider, MD  senna (SENOKOT) 8.6 MG tablet Take 8.6 mg by mouth 2 (two) times daily.   Yes Historical Provider, MD  tiotropium (SPIRIVA) 18 MCG inhalation capsule Place 18 mcg into inhaler and inhale daily.   Yes Historical Provider, MD  traZODone (DESYREL) 100 MG tablet Take 100 mg by mouth at bedtime.   Yes Historical Provider, MD  venlafaxine (EFFEXOR) 37.5 MG tablet Take 1 tablet (37.5 mg total) by mouth 2 (two) times daily. 11/17/15  Yes Altamese Dilling, MD  cephALEXin (KEFLEX) 250 MG capsule Take 1 capsule (250 mg total) by mouth 3 (three) times daily. Patient not taking: Reported on 12/10/2015 11/17/15   Altamese Dilling, MD  doxycycline (VIBRAMYCIN) 50 MG capsule Take 1 capsule (50 mg total) by mouth 2 (two) times daily. Patient not taking: Reported on 12/10/2015 11/17/15   Altamese Dilling, MD    Allergies  Allergen Reactions  . Pregabalin Swelling  . Shellfish-Derived Products Swelling  . Aspirin     REACTION: Unknown reaction  . Clarithromycin     REACTION: Unkown reaction  . Codeine Other (See Comments)    Unknown reaction  . Contrast Media [Iodinated Diagnostic Agents] Other (See Comments)     unknown  . Fentanyl     REACTION: Unknown reaction  . Gabapentin     REACTION: Unknown reaction  . Methocarbamol     REACTION: Unknown reaction  . Nicotine     REACTION: Allergic to patch only Unknown reaction  . Nortriptyline Hcl     REACTION: Unknown reaction  . Nortriptyline Hcl     Other reaction(s): Unknown REACTION: Unknown reaction  . Potassium Iodide     REACTION: Unknown reaction  . Propoxyphene N-Acetaminophen     REACTION: Unknown reaction  . Sulfonamide Derivatives     REACTION: Unknown reaction  . Tramadol Hcl     REACTION: Severe headaches    Family History  Problem Relation Age of Onset  . Family history unknown: Yes    Social History Social History  Substance Use Topics  . Smoking status: Former Games developer  . Smokeless tobacco: Never Used  . Alcohol use No    Review of Systems  Constitutional: Negative for fever. Eyes: Negative  for visual changes. ENT: Negative for sore throat. Cardiovascular: Negative for chest pain. Respiratory: Negative for shortness of breath. Gastrointestinal: No black or bloody stools reported.. Genitourinary: Negative for dysuria. Musculoskeletal: Negative for back pain. Skin: Negative for rash. Neurological: Negative for headache. 10 point Review of Systems otherwise negative ____________________________________________   PHYSICAL EXAM:  VITAL SIGNS: ED Triage Vitals  Enc Vitals Group     BP 12/10/15 1025 (!) 83/44     Pulse Rate 12/10/15 1025 64     Resp 12/10/15 1025 18     Temp 12/10/15 1025 97.6 F (36.4 C)     Temp Source 12/10/15 1025 Oral     SpO2 12/10/15 1025 98 %     Weight 12/10/15 1025 208 lb (94.3 kg)     Height 12/10/15 1025 5\' 3"  (1.6 m)     Head Circumference --      Peak Flow --      Pain Score 12/10/15 1026 10     Pain Loc --      Pain Edu? --      Excl. in GC? --      Constitutional: Alert and oriented. Well appearing and in no distress. Skin is pale. HEENT   Head: Normocephalic  and atraumatic.      Eyes: Conjunctivae are normal. PERRL. Normal extraocular movements.      Ears:         Nose: No congestion/rhinnorhea.   Mouth/Throat: Mucous membranes are moist.   Neck: No stridor. Cardiovascular/Chest: Normal rate, regular rhythm.  No murmurs, rubs, or gallops. Respiratory: Normal respiratory effort without tachypnea nor retractions. Breath sounds are clear and equal bilaterally. No wheezes/rales/rhonchi. Gastrointestinal: Soft. No distention, no guarding, no rebound. Obese, no upper abdominal tenderness to palpation, but moderate tenderness to palpation in the right lower quadrant, left lower quadrant, and suprapubic areas.  Genitourinary/rectal:Deferred Musculoskeletal: Nontender with normal range of motion in all extremities. No joint effusions.  No lower extremity tenderness.  No edema. Neurologic:  Normal speech and language. No gross or focal neurologic deficits are appreciated. Skin:  Skin is warm, dry and intact. No rash noted. Psychiatric: Mood and affect are normal. Speech and behavior are normal. Patient exhibits appropriate insight and judgment.   ____________________________________________  LABS (pertinent positives/negatives)  Labs Reviewed  COMPREHENSIVE METABOLIC PANEL - Abnormal; Notable for the following:       Result Value   Creatinine, Ser 0.41 (*)    Calcium 8.5 (*)    Total Protein 6.0 (*)    Albumin 2.8 (*)    ALT 9 (*)    All other components within normal limits  CBC WITH DIFFERENTIAL/PLATELET - Abnormal; Notable for the following:    Hemoglobin 11.8 (*)    RDW 15.7 (*)    All other components within normal limits  URINALYSIS COMPLETEWITH MICROSCOPIC ([facility] ONLY) - Abnormal; Notable for the following:    Color, Urine YELLOW (*)    APPearance CLOUDY (*)    Nitrite POSITIVE (*)    Leukocytes, UA 1+ (*)    Bacteria, UA FEW (*)    Squamous Epithelial / LPF 0-5 (*)    All other components within normal limits  LIPASE, BLOOD   LACTIC ACID, PLASMA    ____________________________________________    EKG I, [name], MD, the attending physician have personally viewed and interpreted all ECGs.  94 bpm. Normal sinus rhythm. Normal axis. Nonspecific ST and T-wave ____________________________________________  RADIOLOGY All Xrays were viewed by me. Imaging interpreted by Radiologist.  CT abdomen and pelvis with contrast:  IMPRESSION: Severe fecal retention/ impaction over the rectum with minimal associated wall thickening.  Elevated right hemidiaphragm with mild bibasilar atelectasis.  Minimal atherosclerotic coronary artery disease. __________________________________________  PROCEDURES  Procedure(s) performed: None  Critical Care performed: None  ____________________________________________   ED COURSE / ASSESSMENT AND PLAN  Pertinent labs & imaging results that were available during my care of the patient were reviewed by me and considered in my medical decision making (see chart for details).   Ms. Elcock came in for evaluation due to rectal pain or abdominal pain. She told the nurse that she was having spasms or sharp pains in her rectum that by that description sounds like proctalgia fugax, but when I talked with her she states that she is also having lower abdominal pain which is 10 out of 10. She looks very comfortable, and she has chronic pain and took 15 mg of oxyContin this morning.  I discussed with her that we need to get her blood pressure a bit more stable before additional medications that could bring it down. She states that her blood pressure is typically 90/60 and here it has been near that but a little low in the mid 80s over 50s.  She states that she's had vertigo about minutes, but it doesn't necessarily sound like fluid losses. In any case, I do think that I need to obtain laboratory studies, urinalysis, and likely CT the abdomen.  Laboratory studies reassuring. CT scan of  abdomen shows severe rectal impaction. Patient is transferred to Dr. Roxan Hockey at shift change 5pm who will perform manual de compaction and likely enema after that.    Dispo after reevaluation.   CONSULTATIONS:   None   Patient / Family / Caregiver informed of clinical course, medical decision-making process, and agree with plan.     ___________________________________________   FINAL CLINICAL IMPRESSION(S) / ED DIAGNOSES   Final diagnoses:  Lower abdominal pain  Fecal impaction in rectum [facility])              Note: This dictation was prepared with Dragon dictation. Any transcriptional errors that result from this process are unintentional    [name], MD 12/10/15 1704

## 2015-12-10 NOTE — ED Triage Notes (Signed)
Brought in via ems from [facility] with rectal pain  States pain started about 3 days ago per staff no blood noted in stools   Pt describes pain as sharpe

## 2015-12-10 NOTE — ED Notes (Addendum)
Pt awaiting EMS transport back to [facility], pt changed and dressed in new gown

## 2015-12-10 NOTE — ED Provider Notes (Signed)
Patient received in signout from Dr. Shaune PollackLord.  CT imaging with evidence of large stool burden with evidence of fecal impaction.  ------------------------------------------------------------------------------------------------------------------- Fecal Disimpaction Procedure Note:  Performed by me:  Patient placed in the lateral recumbent position with knees drawn towards chest. Nurse present for patient support. Large amount of hard brown stool removed. No complications during procedure.   ------------------------------------------------------------------------------------------------------------------   Patietn was able to tolerate oral hydration.  VSS.  Will DC with laxatives.  Have discussed with the patient and available family all diagnostics and treatments performed thus far and all questions were answered to the best of my ability. The patient demonstrates understanding and agreement with plan.     Willy EddyPatrick Kamin Niblack, MD 12/11/15 [phone]

## 2015-12-10 NOTE — ED Notes (Signed)
Pt unable to have bowel movement after enema, MD made aware

## 2015-12-10 NOTE — ED Notes (Signed)
Pt given enema, placed on bedpan and tolerating well at this time

## 2015-12-13 ENCOUNTER — Ambulatory Visit (INDEPENDENT_AMBULATORY_CARE_PROVIDER_SITE_OTHER): Payer: Self-pay | Admitting: Vascular Surgery

## 2016-09-27 DIAGNOSIS — D649 Anemia, unspecified: Secondary | ICD-10-CM | POA: Insufficient documentation

## 2017-04-11 ENCOUNTER — Telehealth (INDEPENDENT_AMBULATORY_CARE_PROVIDER_SITE_OTHER): Payer: Self-pay

## 2017-04-11 NOTE — Telephone Encounter (Signed)
Patient called wanting to be scheduled for a procedure and wanted me to call [facility] and tell them she needed to be scheduled. The last time the patient was seen was on 12/06/15 for a Unna boot change. I did let her know we could make an appt for her to be seen, she stated she would have to ask her doctor before scheduling an appt.

## 2017-07-14 DIAGNOSIS — L603 Nail dystrophy: Secondary | ICD-10-CM | POA: Insufficient documentation

## 2017-12-08 ENCOUNTER — Ambulatory Visit (INDEPENDENT_AMBULATORY_CARE_PROVIDER_SITE_OTHER): Payer: Medicaid Other | Admitting: Nurse Practitioner

## 2017-12-08 ENCOUNTER — Encounter (INDEPENDENT_AMBULATORY_CARE_PROVIDER_SITE_OTHER): Payer: Self-pay | Admitting: Nurse Practitioner

## 2017-12-08 VITALS — BP 117/74 | HR 64 | Resp 20 | Ht 64.0 in | Wt 225.0 lb

## 2017-12-08 DIAGNOSIS — E785 Hyperlipidemia, unspecified: Secondary | ICD-10-CM | POA: Diagnosis not present

## 2017-12-08 DIAGNOSIS — L97829 Non-pressure chronic ulcer of other part of left lower leg with unspecified severity: Secondary | ICD-10-CM

## 2017-12-08 DIAGNOSIS — L97819 Non-pressure chronic ulcer of other part of right lower leg with unspecified severity: Secondary | ICD-10-CM

## 2017-12-08 DIAGNOSIS — K219 Gastro-esophageal reflux disease without esophagitis: Secondary | ICD-10-CM | POA: Diagnosis not present

## 2017-12-08 DIAGNOSIS — I872 Venous insufficiency (chronic) (peripheral): Secondary | ICD-10-CM | POA: Diagnosis not present

## 2017-12-08 DIAGNOSIS — Z87891 Personal history of nicotine dependence: Secondary | ICD-10-CM

## 2017-12-08 NOTE — Progress Notes (Signed)
Subjective:    Patient ID: Melanie Berry, female    DOB: Jul 17, 1965, 52 y.o.   MRN: [id number] Chief Complaint  Patient presents with  . Follow-up    Venous ulcers    HPI  Melanie Berry is a 52 y.o. female that presents to [place] from [facility] health care, with multiple ulcers on her bilateral lower extremities.  They are dispersed throughout her anterior shin area.  There is one on her right lower extremity and 2 on her left lower extremity.  She states that she has been trying to get in heel for some time.  She also endorses having pain in her bilateral lower extremities.  Her wounds are also weeping and she has 3+ edema bilaterally.  The pain and swelling worsens with prolonged dependency and improves with elevation. The pain is unrelated to activity.  The patient notes that in the morning the legs are better but the leg symptoms worsened throughout the course of the day. The patient has also noted a progressive worsening of the discoloration in the ankle and shin area.   The patient notes that an ulcer has developed acutely without specific trauma and since it occurred it has been very slow to heal.  There is a moderate amount of drainage associated with the open area.  The wound is also very painful.  The patient denies claudication symptoms or rest pain symptoms.  The patient denies DJD and LS spine disease.  The patient has not had any past angiography, interventions or vascular surgery.  Elevation makes the leg symptoms better, dependency makes them much worse. The patient denies any recent changes in medications.  The patient has not been wearing graduated compression.  The patient denies a history of DVT or PE. There is no prior history of phlebitis. There is no history of primary lymphedema.  No history of malignancies. No history of trauma or groin or pelvic surgery. There is no history of radiation treatment to the groin or pelvis       Past Medical History:  Diagnosis  Date  . Anemia   . Charcot's arthropathy   . COPD (chronic obstructive pulmonary disease) (HCC)   . COPD (chronic obstructive pulmonary disease) (HCC)   . Fluid overload   . GERD (gastroesophageal reflux disease)   . MDD (major depressive disorder)     Past Surgical History:  Procedure Laterality Date  . APPENDECTOMY    . CHOLECYSTECTOMY      Social History   Socioeconomic History  . Marital status: Married    Spouse name: Not on file  . Number of children: Not on file  . Years of education: Not on file  . Highest education level: Not on file  Occupational History  . Not on file  Social Needs  . Financial resource strain: Not on file  . Food insecurity:    Worry: Not on file    Inability: Not on file  . Transportation needs:    Medical: Not on file    Non-medical: Not on file  Tobacco Use  . Smoking status: Former Games developer  . Smokeless tobacco: Never Used  Substance and Sexual Activity  . Alcohol use: No  . Drug use: No  . Sexual activity: Not on file  Lifestyle  . Physical activity:    Days per week: Not on file    Minutes per session: Not on file  . Stress: Not on file  Relationships  . Social connections:    Talks  on phone: Not on file    Gets together: Not on file    Attends religious service: Not on file    Active member of club or organization: Not on file    Attends meetings of clubs or organizations: Not on file    Relationship status: Not on file  . Intimate partner violence:    Fear of current or ex partner: Not on file    Emotionally abused: Not on file    Physically abused: Not on file    Forced sexual activity: Not on file  Other Topics Concern  . Not on file  Social History Narrative  . Not on file    Family History  Family history unknown: Yes    Allergies  Allergen Reactions  . Pregabalin Swelling  . Shellfish-Derived Products Swelling  . Aspirin     REACTION: Unknown reaction  . Clarithromycin     REACTION: Unkown reaction  .  Codeine Other (See Comments)    Unknown reaction  . Contrast Media [Iodinated Diagnostic Agents] Other (See Comments)    unknown  . Fentanyl     REACTION: Unknown reaction  . Gabapentin     REACTION: Unknown reaction  . Methocarbamol     REACTION: Unknown reaction  . Nicotine     REACTION: Allergic to patch only Unknown reaction  . Nortriptyline Hcl     REACTION: Unknown reaction  . Nortriptyline Hcl     Other reaction(s): Unknown REACTION: Unknown reaction  . Potassium Iodide     REACTION: Unknown reaction  . Propoxyphene N-Acetaminophen     REACTION: Unknown reaction  . Sulfonamide Derivatives     REACTION: Unknown reaction  . Tramadol Hcl     REACTION: Severe headaches     Review of Systems   Review of Systems: Negative Unless Checked Constitutional: [] Weight loss  [] Fever  [] Chills Cardiac: [] Chest pain   []  Atrial Fibrillation  [] Palpitations   [] Shortness of breath when laying flat   [] Shortness of breath with exertion. Vascular:  [] Pain in legs with walking   [] Pain in legs with standing  [] History of DVT   [] Phlebitis   [] Swelling in legs   [] Varicose veins   [] Non-healing ulcers Pulmonary:   [] Uses home oxygen   [] Productive cough   [] Hemoptysis   [] Wheeze  [] COPD   [] Asthma Neurologic:  [] Dizziness   [] Seizures   [] History of stroke   [] History of TIA  [] Aphasia   [] Vissual changes   [] Weakness or numbness in arm   [x] Weakness or numbness in leg Musculoskeletal:   [] Joint swelling   [] Joint pain   [] Low back pain  []  History of Knee Replacement Hematologic:  [] Easy bruising  [] Easy bleeding   [] Hypercoagulable state   [] Anemic Gastrointestinal:  [] Diarrhea   [] Vomiting  [] Gastroesophageal reflux/heartburn   [] Difficulty swallowing. Genitourinary:  [] Chronic kidney disease   [] Difficult urination  [] Anuric   [] Blood in urine Skin:  [] Rashes   [] Ulcers  Psychological:  [] History of anxiety   []  History of major depression  []  Memory Difficulties     Objective:    Physical Exam  BP 117/74 (BP Location: Left Arm, Patient Position: Sitting)   Pulse 64   Resp 20   Ht 5\' 4"  (1.626 m)   Wt 225 lb (102.1 kg)   BMI 38.62 kg/m   Gen: WD/WN, NAD Head: [place]/AT, No temporalis wasting.  Ear/Nose/Throat: Hearing grossly intact, nares w/o erythema or drainage Eyes: PER, EOMI, sclera nonicteric.  Neck: Supple, no masses.  No JVD.  Pulmonary:  Good air movement, no use of accessory muscles.  Cardiac: RRR Vascular:  Unable to palpate bilateral pedal pulses due to extreme edema.  3+ edema bilaterally.  Weeping wounds present bilaterally. Vessel Right Left  Radial Palpable Palpable   Gastrointestinal: soft, non-distended. No guarding/no peritoneal signs.  Musculoskeletal: Bilateral Charcot foot, and wheelchair-bound Neurologic: Pain and light touch intact in extremities.  Symmetrical.  Speech is fluent. Motor exam as listed above. Psychiatric: Judgment intact, Mood & affect appropriate for pt's clinical situation. Dermatologic: No Venous rashes. No Ulcers Noted.  No changes consistent with cellulitis. Lymph : No Cervical lymphadenopathy, no lichenification or skin changes of chronic lymphedema.      Assessment & Plan:   1. Venous (peripheral) insufficiency No surgery or intervention at this point in time.    I have had a long discussion with the patient regarding venous insufficiency and why it  causes symptoms, specifically venous ulceration . I have discussed with the patient the chronic skin changes that accompany venous insufficiency and the long term sequela such as infection and recurring  ulceration.  Patient will be placed in [employer] which will be changed weekly drainage permitting.  In addition, behavioral modification including several periods of elevation of the lower extremities during the day will be continued. Achieving a position with the ankles at heart level was stressed to the patient  The patient will remain in bilateral 3 layer zinc  oxide Unna wraps for 4 weeks to be changed weekly.  I have sent instructions to her skilled nursing facility on when and how the 3 layer bilateral zinc oxide in wraps should be changed.  We will follow-up with her in 4 weeks to assess her swelling and ulceration.  We will also obtain ABIs to determine if this ulceration has an arterial component as well.   - VAS [place] ABI WITH/WO TBI; Future  2. Hyperlipidemia, unspecified hyperlipidemia type Continue statin as ordered and reviewed, no changes at this time   3. Gastroesophageal reflux disease, esophagitis presence not specified Continue PPI as already ordered, this medication has been reviewed and there are no changes at this time.  Avoidence of caffeine and alcohol  Moderate elevation of the head of the bed    Current Outpatient Medications on File Prior to Visit  Medication Sig Dispense Refill  . acetaminophen (TYLENOL) 325 MG tablet Take 650 mg by mouth every 6 (six) hours as needed for moderate pain.    Marland Kitchen buPROPion (WELLBUTRIN XL) 300 MG 24 hr tablet Take 300 mg by mouth daily.    . cephALEXin (KEFLEX) 250 MG capsule Take 1 capsule (250 mg total) by mouth 3 (three) times daily. (Patient not taking: Reported on 12/10/2015) 9 capsule 0  . Cholecalciferol (VITAMIN D3) 50000 units CAPS Take 50,000 Units by mouth every 30 (thirty) days.    Marland Kitchen doxycycline (VIBRAMYCIN) 50 MG capsule Take 1 capsule (50 mg total) by mouth 2 (two) times daily. (Patient not taking: Reported on 12/10/2015) 14 capsule 0  . fexofenadine-pseudoephedrine (ALLEGRA-D ALLERGY & CONGESTION) 60-120 MG 12 hr tablet Take 1 tablet by mouth 2 (two) times daily.    . fluticasone (FLONASE) 50 MCG/ACT nasal spray Place 2 sprays into both nostrils daily.    . Fluticasone-Salmeterol (ADVAIR) 250-50 MCG/DOSE AEPB Inhale 1 puff into the lungs 2 (two) times daily.    . furosemide (LASIX) 20 MG tablet Take 30 mg by mouth 2 (two) times daily.     Marland Kitchen loperamide (IMODIUM)  2 MG capsule Take 2  mg by mouth as needed for diarrhea or loose stools.    Marland Kitchen LORazepam (ATIVAN) 0.5 MG tablet Take 0.5 mg by mouth every 8 (eight) hours as needed for anxiety.     Marland Kitchen lubiprostone (AMITIZA) 24 MCG capsule Take 24 mcg by mouth daily. Take with food    . meloxicam (MOBIC) 7.5 MG tablet Take 1 tablet (7.5 mg total) by mouth daily. 20 tablet 0  . Menthol, Topical Analgesic, (BIOFREEZE EX) Apply 1 application topically every 8 (eight) hours as needed. Apply to left shoulder for pain    . menthol-cetylpyridinium (CEPACOL) 3 MG lozenge Take 1 lozenge by mouth every hour as needed for sore throat.    . montelukast (SINGULAIR) 10 MG tablet Take 10 mg by mouth at bedtime.    Marland Kitchen omeprazole (PRILOSEC) 20 MG capsule Take 20 mg by mouth daily.    Marland Kitchen oxyCODONE (OXY IR/ROXICODONE) 5 MG immediate release tablet Take 5 mg by mouth every 4 (four) hours as needed for severe pain.    Marland Kitchen oxyCODONE (OXYCONTIN) 15 mg 12 hr tablet Take 1 tablet (15 mg total) by mouth every 12 (twelve) hours. 14 tablet 0  . oxyCODONE-acetaminophen (PERCOCET) 5-325 MG tablet Take 2 tablets by mouth every 6 (six) hours as needed for moderate pain or severe pain. 20 tablet 0  . potassium chloride SA (K-DUR,KLOR-CON) 20 MEQ tablet Take 40 mEq by mouth daily.    . promethazine (PHENERGAN) 25 MG tablet Take 25 mg by mouth every 6 (six) hours as needed for nausea or vomiting.    . senna (SENOKOT) 8.6 MG tablet Take 8.6 mg by mouth 2 (two) times daily.    Marland Kitchen tiotropium (SPIRIVA) 18 MCG inhalation capsule Place 18 mcg into inhaler and inhale daily.    . traZODone (DESYREL) 100 MG tablet Take 100 mg by mouth at bedtime.    Marland Kitchen venlafaxine (EFFEXOR) 37.5 MG tablet Take 1 tablet (37.5 mg total) by mouth 2 (two) times daily. 20 tablet 0   No current facility-administered medications on file prior to visit.     There are no Patient Instructions on file for this visit. No follow-ups on file.   Georgiana Spinner, NP  This note was completed with Sales executive.  Any errors are purely unintentional.

## 2017-12-08 NOTE — Progress Notes (Signed)
hg

## 2018-01-02 ENCOUNTER — Other Ambulatory Visit (INDEPENDENT_AMBULATORY_CARE_PROVIDER_SITE_OTHER): Payer: Self-pay | Admitting: Nurse Practitioner

## 2018-01-02 DIAGNOSIS — R0989 Other specified symptoms and signs involving the circulatory and respiratory systems: Secondary | ICD-10-CM

## 2018-01-02 DIAGNOSIS — R609 Edema, unspecified: Secondary | ICD-10-CM

## 2018-01-05 ENCOUNTER — Encounter (INDEPENDENT_AMBULATORY_CARE_PROVIDER_SITE_OTHER): Payer: Self-pay | Admitting: Nurse Practitioner

## 2018-01-05 ENCOUNTER — Ambulatory Visit (INDEPENDENT_AMBULATORY_CARE_PROVIDER_SITE_OTHER): Payer: Medicaid Other | Admitting: Nurse Practitioner

## 2018-01-05 ENCOUNTER — Telehealth (INDEPENDENT_AMBULATORY_CARE_PROVIDER_SITE_OTHER): Payer: Self-pay | Admitting: Nurse Practitioner

## 2018-01-05 ENCOUNTER — Ambulatory Visit (INDEPENDENT_AMBULATORY_CARE_PROVIDER_SITE_OTHER): Payer: Medicaid Other

## 2018-01-05 VITALS — BP 119/73 | HR 68 | Resp 16 | Ht 64.0 in | Wt 236.0 lb

## 2018-01-05 DIAGNOSIS — R0989 Other specified symptoms and signs involving the circulatory and respiratory systems: Secondary | ICD-10-CM

## 2018-01-05 DIAGNOSIS — I872 Venous insufficiency (chronic) (peripheral): Secondary | ICD-10-CM

## 2018-01-05 DIAGNOSIS — E785 Hyperlipidemia, unspecified: Secondary | ICD-10-CM

## 2018-01-05 DIAGNOSIS — L97929 Non-pressure chronic ulcer of unspecified part of left lower leg with unspecified severity: Secondary | ICD-10-CM

## 2018-01-05 DIAGNOSIS — I890 Lymphedema, not elsewhere classified: Secondary | ICD-10-CM | POA: Insufficient documentation

## 2018-01-05 DIAGNOSIS — R609 Edema, unspecified: Secondary | ICD-10-CM | POA: Diagnosis not present

## 2018-01-05 DIAGNOSIS — L97919 Non-pressure chronic ulcer of unspecified part of right lower leg with unspecified severity: Secondary | ICD-10-CM

## 2018-01-05 DIAGNOSIS — I83029 Varicose veins of left lower extremity with ulcer of unspecified site: Secondary | ICD-10-CM | POA: Insufficient documentation

## 2018-01-05 NOTE — Progress Notes (Signed)
Subjective:    Patient ID: Melanie Berry, female    DOB: Jan 05, 1966, 52 y.o.   MRN: [id number] Chief Complaint  Patient presents with  . Follow-up    4 week Bialteral ABI f/u    HPI  Melanie Berry is a 52 y.o. female that is seen for follow up evaluation of leg pain and swelling associated with venous ulceration. The patient was recently seen here and started on Unna boot therapy.  The swelling abruptly became much worse bilaterally and is associated with pain and discoloration. The pain and swelling worsens with prolonged dependency and improves with elevation.  The patient notes that in the morning the legs are better but the leg symptoms worsened throughout the course of the day. The patient has also noted a progressive worsening of the discoloration in the ankle and shin area.   The patient notes that an ulcer has developed acutely without specific trauma and since it occurred it has been very slow to heal.  There is a moderate amount of drainage associated with the open area.  The wound is also very painful.  The patient has been getting wraps done weekly at her nursing facility.  The patient states that they have been elevating as much as possible. The patient denies any recent changes in medications.  The patient denies a history of DVT or PE. There is no prior history of phlebitis. There is no history of primary lymphedema.  No SOB or increased cough.  No sputum production.  No recent episodes of CHF exacerbation.  She underwent bilateral ABIs today due to concern for possible mixed atherosclerotic arterial and venous disease as cause for the slow healing wounds.  Today her right ABI was 1.24, her left was 1.34.  Her tibial waveforms were triphasic bilateral with normal digit waveforms.   Past Medical History:  Diagnosis Date  . Anemia   . Charcot's arthropathy   . COPD (chronic obstructive pulmonary disease) (HCC)   . COPD (chronic obstructive pulmonary disease) (HCC)   .  Fluid overload   . GERD (gastroesophageal reflux disease)   . MDD (major depressive disorder)     Past Surgical History:  Procedure Laterality Date  . APPENDECTOMY    . CHOLECYSTECTOMY      Social History   Socioeconomic History  . Marital status: Married    Spouse name: Not on file  . Number of children: Not on file  . Years of education: Not on file  . Highest education level: Not on file  Occupational History  . Not on file  Social Needs  . Financial resource strain: Not on file  . Food insecurity:    Worry: Not on file    Inability: Not on file  . Transportation needs:    Medical: Not on file    Non-medical: Not on file  Tobacco Use  . Smoking status: Former Games developer  . Smokeless tobacco: Never Used  Substance and Sexual Activity  . Alcohol use: No  . Drug use: No  . Sexual activity: Not on file  Lifestyle  . Physical activity:    Days per week: Not on file    Minutes per session: Not on file  . Stress: Not on file  Relationships  . Social connections:    Talks on phone: Not on file    Gets together: Not on file    Attends religious service: Not on file    Active member of club or organization: Not on file  Attends meetings of clubs or organizations: Not on file    Relationship status: Not on file  . Intimate partner violence:    Fear of current or ex partner: Not on file    Emotionally abused: Not on file    Physically abused: Not on file    Forced sexual activity: Not on file  Other Topics Concern  . Not on file  Social History Narrative  . Not on file    Family History  Family history unknown: Yes    Allergies  Allergen Reactions  . Pregabalin Swelling  . Shellfish-Derived Products Swelling  . Aspirin     REACTION: Unknown reaction  . Clarithromycin     REACTION: Unkown reaction  . Codeine Other (See Comments)    Unknown reaction  . Contrast Media [Iodinated Diagnostic Agents] Other (See Comments)    unknown  . Fentanyl     REACTION:  Unknown reaction  . Gabapentin     REACTION: Unknown reaction  . Methocarbamol     REACTION: Unknown reaction  . Nicotine     REACTION: Allergic to patch only Unknown reaction  . Nortriptyline Hcl     REACTION: Unknown reaction  . Nortriptyline Hcl     Other reaction(s): Unknown REACTION: Unknown reaction  . Potassium Iodide     REACTION: Unknown reaction  . Propoxyphene N-Acetaminophen     REACTION: Unknown reaction  . Sulfonamide Derivatives     REACTION: Unknown reaction  . Tramadol Hcl     REACTION: Severe headaches     Review of Systems   Review of Systems: Negative Unless Checked Constitutional: [] Weight loss  [] Fever  [] Chills Cardiac: [] Chest pain   []  Atrial Fibrillation  [] Palpitations   [] Shortness of breath when laying flat   [] Shortness of breath with exertion. Vascular:  [] Pain in legs with walking   [] Pain in legs with standing  [] History of DVT   [] Phlebitis   [x] Swelling in legs   [] Varicose veins   [x] Non-healing ulcers Pulmonary:   [] Uses home oxygen   [] Productive cough   [] Hemoptysis   [] Wheeze  [] COPD   [] Asthma Neurologic:  [] Dizziness   [] Seizures   [x] History of stroke   [] History of TIA  [] Aphasia   [] Vissual changes   [] Weakness or numbness in arm   [] Weakness or numbness in leg Musculoskeletal:   [] Joint swelling   [] Joint pain   [] Low back pain  []  History of Knee Replacement Hematologic:  [] Easy bruising  [] Easy bleeding   [] Hypercoagulable state   [] Anemic Gastrointestinal:  [] Diarrhea   [] Vomiting  [x] Gastroesophageal reflux/heartburn   [] Difficulty swallowing. Genitourinary:  [] Chronic kidney disease   [] Difficult urination  [] Anuric   [] Blood in urine Skin:  [] Rashes   [x] Ulcers  Psychological:  [] History of anxiety   []  History of major depression  []  Memory Difficulties     Objective:   Physical Exam  BP 119/73 (BP Location: Left Arm)   Pulse 68   Resp 16   Ht 5\' 4"  (1.626 m)   Wt 236 lb (107 kg)   BMI 40.51 kg/m   Gen: WD/WN,  NAD Head: [place]/AT, No temporalis wasting.  Ear/Nose/Throat: Hearing grossly intact, nares w/o erythema or drainage Eyes: PER, EOMI, sclera nonicteric.  Neck: Supple, no masses.  No JVD.  Pulmonary:  Good air movement, no use of accessory muscles.  Cardiac: RRR Vascular:  2+ edema bilaterally.  Wounds not weeping today.  Multiple ulcerations on the bilateral legs. Vessel Right Left  Radial Palpable  Palpable  Gastrointestinal: soft, non-distended. No guarding/no peritoneal signs.  Musculoskeletal: Bilateral Charcot foot wheelchair-bound.  Weak right side. Neurologic: Pain and light touch intact in extremities.  Symmetrical.  Speech is fluent. Motor exam as listed above. Psychiatric: Judgment intact, Mood & affect appropriate for pt's clinical situation. Dermatologic: No Venous rashes. No Ulcers Noted.  No changes consistent with cellulitis. Lymph : No Cervical lymphadenopathy, no lichenification or skin changes of chronic lymphedema.      Assessment & Plan:   1. Venous (peripheral) insufficiency See below  2. Hyperlipidemia, unspecified hyperlipidemia type Continue statin as ordered and reviewed, no changes at this time   3. Venous ulcers of both lower extremities (HCC) No surgery or intervention at this point in time.    I have had a long discussion with the patient regarding venous insufficiency and why it  causes symptoms, specifically venous ulceration . I have discussed with the patient the chronic skin changes that accompany venous insufficiency and the long term sequela such as infection and recurring  ulceration.  Patient will be placed in [employer] which will be changed weekly drainage permitting.  In addition, behavioral modification including several periods of elevation of the lower extremities during the day will be continued. Achieving a position with the ankles at heart level was stressed to the patient  The patient is instructed to begin routine exercise, especially  walking on a daily basis  We will follow-up with patient in 4 weeks to assess ulceration healing.    Current Outpatient Medications on File Prior to Visit  Medication Sig Dispense Refill  . buPROPion (WELLBUTRIN XL) 300 MG 24 hr tablet Take 300 mg by mouth daily.    . Cholecalciferol (VITAMIN D3) 50000 units CAPS Take 50,000 Units by mouth every 30 (thirty) days.    . clonazePAM (KLONOPIN) 0.5 MG tablet Take 0.5 mg by mouth 2 (two) times daily as needed for anxiety.    . fluticasone (FLONASE) 50 MCG/ACT nasal spray Place 2 sprays into both nostrils daily.    . Fluticasone-Salmeterol (ADVAIR) 250-50 MCG/DOSE AEPB Inhale 1 puff into the lungs 2 (two) times daily.    . furosemide (LASIX) 20 MG tablet Take 30 mg by mouth 2 (two) times daily.     Marland Kitchen HYDROcodone-acetaminophen (NORCO) 10-325 MG tablet Take 1 tablet by mouth every 6 (six) hours as needed.    . loperamide (IMODIUM) 2 MG capsule Take 2 mg by mouth as needed for diarrhea or loose stools.    . lubiprostone (AMITIZA) 24 MCG capsule Take 24 mcg by mouth daily. Take with food    . meloxicam (MOBIC) 7.5 MG tablet Take 1 tablet (7.5 mg total) by mouth daily. 20 tablet 0  . Menthol, Topical Analgesic, (BIOFREEZE EX) Apply 1 application topically every 8 (eight) hours as needed. Apply to left shoulder for pain    . menthol-cetylpyridinium (CEPACOL) 3 MG lozenge Take 1 lozenge by mouth every hour as needed for sore throat.    . nicotine (NICODERM CQ - DOSED IN MG/24 HR) 7 mg/24hr patch Place 7 mg onto the skin daily.    Marland Kitchen omeprazole (PRILOSEC) 20 MG capsule Take 20 mg by mouth daily.    . potassium chloride SA (K-DUR,KLOR-CON) 20 MEQ tablet Take 40 mEq by mouth daily.    Marland Kitchen senna (SENOKOT) 8.6 MG tablet Take 8.6 mg by mouth 2 (two) times daily.    Marland Kitchen tiotropium (SPIRIVA) 18 MCG inhalation capsule Place 18 mcg into inhaler and inhale daily.    Marland Kitchen  traZODone (DESYREL) 100 MG tablet Take 100 mg by mouth at bedtime.    Marland Kitchen acetaminophen (TYLENOL) 325 MG  tablet Take 650 mg by mouth every 6 (six) hours as needed for moderate pain.     No current facility-administered medications on file prior to visit.     There are no Patient Instructions on file for this visit. No follow-ups on file.   Georgiana Spinner, NP  This note was completed with Office manager.  Any errors are purely unintentional.

## 2018-02-02 ENCOUNTER — Ambulatory Visit (INDEPENDENT_AMBULATORY_CARE_PROVIDER_SITE_OTHER): Payer: Medicaid Other | Admitting: Nurse Practitioner

## 2018-02-02 ENCOUNTER — Encounter (INDEPENDENT_AMBULATORY_CARE_PROVIDER_SITE_OTHER): Payer: Self-pay | Admitting: Nurse Practitioner

## 2018-02-02 VITALS — BP 124/68 | HR 70 | Resp 18 | Ht 66.0 in | Wt 242.0 lb

## 2018-02-02 DIAGNOSIS — L97929 Non-pressure chronic ulcer of unspecified part of left lower leg with unspecified severity: Secondary | ICD-10-CM

## 2018-02-02 DIAGNOSIS — E785 Hyperlipidemia, unspecified: Secondary | ICD-10-CM | POA: Diagnosis not present

## 2018-02-02 DIAGNOSIS — I872 Venous insufficiency (chronic) (peripheral): Secondary | ICD-10-CM | POA: Diagnosis not present

## 2018-02-02 DIAGNOSIS — K219 Gastro-esophageal reflux disease without esophagitis: Secondary | ICD-10-CM | POA: Diagnosis not present

## 2018-02-02 DIAGNOSIS — L97919 Non-pressure chronic ulcer of unspecified part of right lower leg with unspecified severity: Secondary | ICD-10-CM | POA: Diagnosis not present

## 2018-02-02 NOTE — Progress Notes (Signed)
Subjective:    Patient ID: Melanie Berry, female    DOB: 1966-01-19, 52 y.o.   MRN: [id number] Chief Complaint  Patient presents with  . Follow-up    4 week Unna boot check    HPI  Melanie Berry is a 52 y.o. female seen for follow-up evaluation of leg swelling and venous ulceration.  The patient is continuing on a wrap therapy.  Today she is much less swollen, however there is still small venous ulcerations bilaterally.  She has been elevating her bilateral lower extremities at her nursing home.  She states that she is having some trouble with the Unna wraps due to the fact that they are drying and they irritate her skin near the end of the week.  She also states that they are cutting in her leg somewhat.  Otherwise she denies any other complaints.  Denies any fever, chills, nausea, vomiting or diarrhea.  Patient denies any rest pain like symptoms.  Patient denies a history of DVT or PE there. There is no history of phlebitis there is a history of primary lymphedema.  Past Medical History:  Diagnosis Date  . Anemia   . Charcot's arthropathy   . COPD (chronic obstructive pulmonary disease) (HCC)   . COPD (chronic obstructive pulmonary disease) (HCC)   . Fluid overload   . GERD (gastroesophageal reflux disease)   . MDD (major depressive disorder)     Past Surgical History:  Procedure Laterality Date  . APPENDECTOMY    . CHOLECYSTECTOMY      Social History   Socioeconomic History  . Marital status: Married    Spouse name: Not on file  . Number of children: Not on file  . Years of education: Not on file  . Highest education level: Not on file  Occupational History  . Not on file  Social Needs  . Financial resource strain: Not on file  . Food insecurity:    Worry: Not on file    Inability: Not on file  . Transportation needs:    Medical: Not on file    Non-medical: Not on file  Tobacco Use  . Smoking status: Former Games developer  . Smokeless tobacco: Never Used    Substance and Sexual Activity  . Alcohol use: No  . Drug use: No  . Sexual activity: Not on file  Lifestyle  . Physical activity:    Days per week: Not on file    Minutes per session: Not on file  . Stress: Not on file  Relationships  . Social connections:    Talks on phone: Not on file    Gets together: Not on file    Attends religious service: Not on file    Active member of club or organization: Not on file    Attends meetings of clubs or organizations: Not on file    Relationship status: Not on file  . Intimate partner violence:    Fear of current or ex partner: Not on file    Emotionally abused: Not on file    Physically abused: Not on file    Forced sexual activity: Not on file  Other Topics Concern  . Not on file  Social History Narrative  . Not on file    Family History  Family history unknown: Yes    Allergies  Allergen Reactions  . Pregabalin Swelling  . Shellfish-Derived Products Swelling  . Aspirin     REACTION: Unknown reaction  . Clarithromycin  REACTION: Unkown reaction  . Codeine Other (See Comments)    Unknown reaction  . Contrast Media [Iodinated Diagnostic Agents] Other (See Comments)    unknown  . Fentanyl     REACTION: Unknown reaction  . Gabapentin     REACTION: Unknown reaction  . Methocarbamol     REACTION: Unknown reaction  . Nicotine     REACTION: Allergic to patch only Unknown reaction  . Nortriptyline Hcl     REACTION: Unknown reaction  . Nortriptyline Hcl     Other reaction(s): Unknown REACTION: Unknown reaction  . Potassium Iodide     REACTION: Unknown reaction  . Propoxyphene N-Acetaminophen     REACTION: Unknown reaction  . Sulfonamide Derivatives     REACTION: Unknown reaction  . Tramadol Hcl     REACTION: Severe headaches     Review of Systems   Review of Systems: Negative Unless Checked Constitutional: [] Weight loss  [] Fever  [] Chills Cardiac: [] Chest pain   []  Atrial Fibrillation  [] Palpitations    [] Shortness of breath when laying flat   [] Shortness of breath with exertion. Vascular:  [] Pain in legs with walking   [] Pain in legs with standing  [] History of DVT   [] Phlebitis   [x] Swelling in legs   [] Varicose veins   [x] Non-healing ulcers Pulmonary:   [] Uses home oxygen   [] Productive cough   [] Hemoptysis   [] Wheeze  [] COPD   [] Asthma Neurologic:  [] Dizziness   [] Seizures   [x] History of stroke   [] History of TIA  [] Aphasia   [] Vissual changes   [] Weakness or numbness in arm   [] Weakness or numbness in leg Musculoskeletal:   [] Joint swelling   [] Joint pain   [] Low back pain  []  History of Knee Replacement Hematologic:  [] Easy bruising  [] Easy bleeding   [] Hypercoagulable state   [] Anemic Gastrointestinal:  [] Diarrhea   [] Vomiting  [x] Gastroesophageal reflux/heartburn   [] Difficulty swallowing. Genitourinary:  [] Chronic kidney disease   [] Difficult urination  [] Anuric   [] Blood in urine Skin:  [] Rashes   [x] Ulcers  Psychological:  [] History of anxiety   []  History of major depression  []  Memory Difficulties     Objective:   Physical Exam  BP 124/68 (BP Location: Right Arm, Patient Position: Sitting)   Pulse 70   Resp 18   Ht 5\' 6"  (1.676 m)   Wt 242 lb (109.8 kg)   BMI 39.06 kg/m   Gen: WD/WN, NAD Head: [place]/AT, No temporalis wasting.  Ear/Nose/Throat: Hearing grossly intact, nares w/o erythema or drainage Eyes: PER, EOMI, sclera nonicteric.  Neck: Supple, no masses.  No JVD.  Pulmonary:  Good air movement, no use of accessory muscles.  Cardiac: RRR Vascular:  2+ edema bilaterally.  Multiple small dime sized ulcerations bilaterally. Vessel Right Left  Radial Palpable Palpable   Gastrointestinal: soft, non-distended. No guarding/no peritoneal signs.  Musculoskeletal: Bilateral Charcot foot, wheelchair-bound.  Weak right side Neurologic: Pain and light touch intact in extremities.  Symmetrical.  Speech is fluent. Motor exam as listed above. Psychiatric: Judgment intact, Mood &  affect appropriate for pt's clinical situation. Dermatologic: No Venous rashes. No Ulcers Noted.  No changes consistent with cellulitis. Lymph : No Cervical lymphadenopathy, no lichenification or skin changes of chronic lymphedema.      Assessment & Plan:   1. Venous ulcers of both lower extremities (HCC) Lower extremity wounds are looking much better, however there is still open ulcerations bilaterally.  The patient states that the substance is drying too soon and it is  irritating to her legs.  I have advised the nursing facility to train her interest biweekly to try to prevent this discomfort.  I also advised the nursing facility to wrap to the level of the knee as there is an area where it is cutting into her leg.  We will have the patient follow-up in 4 weeks to evaluate the status of her wounds.  2. Hyperlipidemia, unspecified hyperlipidemia type Continue statin as ordered and reviewed, no changes at this time   3. Gastroesophageal reflux disease, esophagitis presence not specified Continue PPI as already ordered, this medication has been reviewed and there are no changes at this time.  Avoidence of caffeine and alcohol  Moderate elevation of the head of the bed    Current Outpatient Medications on File Prior to Visit  Medication Sig Dispense Refill  . acetaminophen (TYLENOL) 325 MG tablet Take 650 mg by mouth every 6 (six) hours as needed for moderate pain.    Marland Kitchen buPROPion (WELLBUTRIN XL) 300 MG 24 hr tablet Take 300 mg by mouth daily.    . Cholecalciferol (VITAMIN D3) 50000 units CAPS Take 50,000 Units by mouth every 30 (thirty) days.    . clonazePAM (KLONOPIN) 0.5 MG tablet Take 0.5 mg by mouth 2 (two) times daily as needed for anxiety.    . fluticasone (FLONASE) 50 MCG/ACT nasal spray Place 2 sprays into both nostrils daily.    . Fluticasone-Salmeterol (ADVAIR) 250-50 MCG/DOSE AEPB Inhale 1 puff into the lungs 2 (two) times daily.    . furosemide (LASIX) 20 MG tablet Take 30  mg by mouth 2 (two) times daily.     Marland Kitchen HYDROcodone-acetaminophen (NORCO) 10-325 MG tablet Take 1 tablet by mouth every 6 (six) hours as needed.    . loperamide (IMODIUM) 2 MG capsule Take 2 mg by mouth as needed for diarrhea or loose stools.    . lubiprostone (AMITIZA) 24 MCG capsule Take 24 mcg by mouth daily. Take with food    . meloxicam (MOBIC) 7.5 MG tablet Take 1 tablet (7.5 mg total) by mouth daily. 20 tablet 0  . Menthol, Topical Analgesic, (BIOFREEZE EX) Apply 1 application topically every 8 (eight) hours as needed. Apply to left shoulder for pain    . menthol-cetylpyridinium (CEPACOL) 3 MG lozenge Take 1 lozenge by mouth every hour as needed for sore throat.    . nicotine (NICODERM CQ - DOSED IN MG/24 HR) 7 mg/24hr patch Place 7 mg onto the skin daily.    Marland Kitchen omeprazole (PRILOSEC) 20 MG capsule Take 20 mg by mouth daily.    . potassium chloride SA (K-DUR,KLOR-CON) 20 MEQ tablet Take 40 mEq by mouth daily.    Marland Kitchen senna (SENOKOT) 8.6 MG tablet Take 8.6 mg by mouth 2 (two) times daily.    Marland Kitchen tiotropium (SPIRIVA) 18 MCG inhalation capsule Place 18 mcg into inhaler and inhale daily.    . traZODone (DESYREL) 100 MG tablet Take 100 mg by mouth at bedtime.     No current facility-administered medications on file prior to visit.     There are no Patient Instructions on file for this visit. No follow-ups on file.   Georgiana Spinner, NP  This note was completed with Office manager.  Any errors are purely unintentional.

## 2018-02-09 ENCOUNTER — Encounter (INDEPENDENT_AMBULATORY_CARE_PROVIDER_SITE_OTHER): Payer: Self-pay | Admitting: Nurse Practitioner

## 2018-02-09 ENCOUNTER — Other Ambulatory Visit: Payer: Self-pay

## 2018-02-09 ENCOUNTER — Ambulatory Visit (INDEPENDENT_AMBULATORY_CARE_PROVIDER_SITE_OTHER): Payer: Medicaid Other | Admitting: Nurse Practitioner

## 2018-02-09 DIAGNOSIS — I890 Lymphedema, not elsewhere classified: Secondary | ICD-10-CM

## 2018-02-09 NOTE — Progress Notes (Signed)
History of Present Illness  There is no documented history at this time  Assessments & Plan   There are no diagnoses linked to this encounter.    Additional instructions  Subjective:  Patient presents with venous ulcer of the Bilateral lower extremity.    Procedure:  3 layer unna wrap was placed Bilateral lower extremity.   Plan:   Follow up in one week.  

## 2018-02-16 ENCOUNTER — Encounter (INDEPENDENT_AMBULATORY_CARE_PROVIDER_SITE_OTHER): Payer: Self-pay | Admitting: Nurse Practitioner

## 2018-02-16 ENCOUNTER — Ambulatory Visit (INDEPENDENT_AMBULATORY_CARE_PROVIDER_SITE_OTHER): Payer: Medicaid Other | Admitting: Nurse Practitioner

## 2018-02-16 DIAGNOSIS — I872 Venous insufficiency (chronic) (peripheral): Secondary | ICD-10-CM

## 2018-02-16 DIAGNOSIS — I83019 Varicose veins of right lower extremity with ulcer of unspecified site: Secondary | ICD-10-CM

## 2018-02-16 DIAGNOSIS — L97929 Non-pressure chronic ulcer of unspecified part of left lower leg with unspecified severity: Secondary | ICD-10-CM | POA: Diagnosis not present

## 2018-02-16 DIAGNOSIS — L97919 Non-pressure chronic ulcer of unspecified part of right lower leg with unspecified severity: Secondary | ICD-10-CM | POA: Diagnosis not present

## 2018-02-16 NOTE — Progress Notes (Signed)
History of Present Illness  There is no documented history at this time  Assessments & Plan   There are no diagnoses linked to this encounter.    Additional instructions  Subjective:  Patient presents with venous ulcer of the Bilateral lower extremity.    Procedure:  3 layer unna wrap was placed Bilateral lower extremity.   Plan:   Follow up in one week.  

## 2018-02-23 ENCOUNTER — Encounter (INDEPENDENT_AMBULATORY_CARE_PROVIDER_SITE_OTHER): Payer: Self-pay | Admitting: Nurse Practitioner

## 2018-02-23 ENCOUNTER — Ambulatory Visit (INDEPENDENT_AMBULATORY_CARE_PROVIDER_SITE_OTHER): Payer: Medicaid Other | Admitting: Nurse Practitioner

## 2018-02-23 VITALS — BP 116/73 | HR 87 | Resp 16

## 2018-02-23 DIAGNOSIS — L97919 Non-pressure chronic ulcer of unspecified part of right lower leg with unspecified severity: Secondary | ICD-10-CM

## 2018-02-23 DIAGNOSIS — I872 Venous insufficiency (chronic) (peripheral): Secondary | ICD-10-CM

## 2018-02-23 DIAGNOSIS — I890 Lymphedema, not elsewhere classified: Secondary | ICD-10-CM

## 2018-02-23 DIAGNOSIS — K219 Gastro-esophageal reflux disease without esophagitis: Secondary | ICD-10-CM | POA: Diagnosis not present

## 2018-02-23 DIAGNOSIS — L97929 Non-pressure chronic ulcer of unspecified part of left lower leg with unspecified severity: Secondary | ICD-10-CM

## 2018-02-23 NOTE — Progress Notes (Signed)
Subjective:    Patient ID: Melanie Berry, female    DOB: 10/18/1965, 53 y.o.   MRN: [id number] No chief complaint on file.   HPI  Melanie Berry is a 53 y.o. female that presents today today with a chief complaint of venous ulceration.  [name] has been in Unna boots for several weeks and her ulcerations have been significantly decreased.  Today she only has small ulcerations on each lower extremity.  These ulcerations are 1 to 2 cm in size.  The patient states that she currently does not have any compression socks at her facility.  The patient denies any fever, chills, nausea, vomiting or diarrhea.  She denies any chest pain or shortness of breath.  She denies any issues with wound wrap therapy.  Past Medical History:  Diagnosis Date  . Anemia   . Charcot's arthropathy   . COPD (chronic obstructive pulmonary disease) (HCC)   . COPD (chronic obstructive pulmonary disease) (HCC)   . Fluid overload   . GERD (gastroesophageal reflux disease)   . MDD (major depressive disorder)     Past Surgical History:  Procedure Laterality Date  . APPENDECTOMY    . CHOLECYSTECTOMY      Social History   Socioeconomic History  . Marital status: Married    Spouse name: Not on file  . Number of children: Not on file  . Years of education: Not on file  . Highest education level: Not on file  Occupational History  . Not on file  Social Needs  . Financial resource strain: Not on file  . Food insecurity:    Worry: Not on file    Inability: Not on file  . Transportation needs:    Medical: Not on file    Non-medical: Not on file  Tobacco Use  . Smoking status: Former Games developer  . Smokeless tobacco: Never Used  Substance and Sexual Activity  . Alcohol use: No  . Drug use: No  . Sexual activity: Not on file  Lifestyle  . Physical activity:    Days per week: Not on file    Minutes per session: Not on file  . Stress: Not on file  Relationships  . Social connections:    Talks on phone: Not  on file    Gets together: Not on file    Attends religious service: Not on file    Active member of club or organization: Not on file    Attends meetings of clubs or organizations: Not on file    Relationship status: Not on file  . Intimate partner violence:    Fear of current or ex partner: Not on file    Emotionally abused: Not on file    Physically abused: Not on file    Forced sexual activity: Not on file  Other Topics Concern  . Not on file  Social History Narrative  . Not on file    Family History  Family history unknown: Yes    Allergies  Allergen Reactions  . Pregabalin Swelling  . Shellfish-Derived Products Swelling  . Aspirin     REACTION: Unknown reaction  . Clarithromycin     REACTION: Unkown reaction  . Codeine Other (See Comments)    Unknown reaction  . Contrast Media [Iodinated Diagnostic Agents] Other (See Comments)    unknown  . Fentanyl     REACTION: Unknown reaction  . Gabapentin     REACTION: Unknown reaction  . Methocarbamol     REACTION:  Unknown reaction  . Nicotine     REACTION: Allergic to patch only Unknown reaction  . Nortriptyline Hcl     REACTION: Unknown reaction  . Nortriptyline Hcl     Other reaction(s): Unknown REACTION: Unknown reaction  . Potassium Iodide     REACTION: Unknown reaction  . Propoxyphene N-Acetaminophen     REACTION: Unknown reaction  . Sulfonamide Derivatives     REACTION: Unknown reaction  . Tramadol Hcl     REACTION: Severe headaches     Review of Systems   Review of Systems: Negative Unless Checked Constitutional: [] Weight loss  [] Fever  [] Chills Cardiac: [] Chest pain   []  Atrial Fibrillation  [] Palpitations   [] Shortness of breath when laying flat   [] Shortness of breath with exertion. [] Shortness of breath at rest Vascular:  [] Pain in legs with walking   [] Pain in legs with standing [] Pain in legs when laying flat   [] Claudication    [] Pain in feet when laying flat    [] History of DVT   [] Phlebitis    [x] Swelling in legs   [] Varicose veins   [x] Non-healing ulcers Pulmonary:   [x] Uses home oxygen   [x] Productive cough   [] Hemoptysis   [] Wheeze  [x] COPD   [] Asthma Neurologic:  [] Dizziness   [] Seizures  [] Blackouts [x] History of stroke   [] History of TIA  [] Aphasia   [] Temporary Blindness   [] Weakness or numbness in arm   [x] Weakness or numbness in leg Musculoskeletal:   [] Joint swelling   [] Joint pain   [] Low back pain  []  History of Knee Replacement [] Arthritis [] back Surgeries  []  Spinal Stenosis    Hematologic:  [] Easy bruising  [] Easy bleeding   [] Hypercoagulable state   [] Anemic Gastrointestinal:  [] Diarrhea   [] Vomiting  [] Gastroesophageal reflux/heartburn   [] Difficulty swallowing. [] Abdominal pain Genitourinary:  [] Chronic kidney disease   [] Difficult urination  [] Anuric   [] Blood in urine [] Frequent urination  [] Burning with urination   [] Hematuria Skin:  [] Rashes   [x] Ulcers [] Wounds Psychological:  [] History of anxiety   []  History of major depression  []  Memory Difficulties     Objective:   Physical Exam  BP 116/73 (BP Location: Left Wrist)   Pulse 87   Resp 16   Gen: WD/WN, NAD, appears ill Head: [place]/AT, No temporalis wasting.  Ear/Nose/Throat: Hearing grossly intact, nares erythematous with drainage Eyes: PER, EOMI, sclera nonicteric.  Neck: Supple, no masses.  No JVD.  Pulmonary:  Good air movement, no use of accessory muscles.  Uses home oxygen Cardiac: RRR Vascular:  2-3 small scattered ulcerations ranging 1 to 2 cm in size Vessel Right Left  Radial Palpable Palpable   Gastrointestinal: soft, non-distended. No guarding/no peritoneal signs.  Musculoskeletal: Bilateral upper extremity weakness and patient is wheelchair-bound with Charcot foot. Neurologic: Pain and light touch intact in extremities.  Symmetrical.  Speech is fluent. Motor exam as listed above. Psychiatric: Judgment intact, Mood & affect appropriate for pt's clinical situation. Dermatologic:  Bilateral  stasis dermatitis No changes consistent with cellulitis. Lymph : No Cervical lymphadenopathy, no lichenification or skin changes of chronic lymphedema.      Assessment & Plan:   1. Lymphedema The patient is currently wheelchair-bound which without some sort of compression therapy she will continue to have swelling and repeated ulceration as well as be at risk for infection such as cellulitis.  We will continue with Unna wraps and the patient will follow-up in 4 weeks.  The Unna wraps will be done at her facility to be changed twice  a week.  With continued Unna wrap therapy these wound should be healed in the next 4 weeks and then we will transition the patient to some sort of medical grade 1 compression therapy that she can utilize while at her facility.  Patient will follow-up in the office with me in 4 weeks.  2. Gastroesophageal reflux disease, esophagitis presence not specified Continue PPI as already ordered, this medication has been reviewed and there are no changes at this time.  Avoidence of caffeine and alcohol  Moderate elevation of the head of the bed   3. Venous ulcers of both lower extremities (HCC) Very nearly healed.  We will continue with Unna wrap therapy.  Will follow the plan as detailed above.   Current Outpatient Medications on File Prior to Visit  Medication Sig Dispense Refill  . acetaminophen (TYLENOL) 325 MG tablet Take 650 mg by mouth every 6 (six) hours as needed for moderate pain.    . ARIPIPRAZOLE PO Take 7 mg by mouth. Takes 7mg  by mouth one time a day    . azelastine (ASTELIN) 0.1 % nasal spray Place 2 sprays into both nostrils 2 (two) times daily. Use in each nostril as directed    . Cholecalciferol (VITAMIN D3) 50000 units CAPS Take 50,000 Units by mouth every 30 (thirty) days.    . ciclopirox (LOPROX) 0.77 % cream Apply topically 2 (two) times daily. Apply bilateral toenails topically every day shift for toenail fungus    . clonazePAM (KLONOPIN) 0.5 MG  tablet Take 0.5 mg by mouth 2 (two) times daily as needed for anxiety.    . fluticasone (FLONASE) 50 MCG/ACT nasal spray Place 2 sprays into both nostrils daily.    . furosemide (LASIX) 20 MG tablet Take 30 mg by mouth 2 (two) times daily.     Marland Kitchen HYDROcodone-acetaminophen (NORCO) 10-325 MG tablet Take 1 tablet by mouth every 6 (six) hours as needed.    Marland Kitchen ipratropium-albuterol (DUONEB) 0.5-2.5 (3) MG/3ML SOLN Take 3 mLs by nebulization every 6 (six) hours as needed.    . loperamide (IMODIUM) 2 MG capsule Take 2 mg by mouth as needed for diarrhea or loose stools.    . lubiprostone (AMITIZA) 24 MCG capsule Take 24 mcg by mouth daily. Take with food    . Melatonin 3 MG TABS Take by mouth.    . meloxicam (MOBIC) 7.5 MG tablet Take 1 tablet (7.5 mg total) by mouth daily. 20 tablet 0  . Menthol, Topical Analgesic, (BIOFREEZE EX) Apply 1 application topically every 8 (eight) hours as needed. Apply to left shoulder for pain    . menthol-cetylpyridinium (CEPACOL) 3 MG lozenge Take 1 lozenge by mouth every hour as needed for sore throat.    . montelukast (SINGULAIR) 10 MG tablet Take 10 mg by mouth at bedtime.    . nicotine (NICODERM CQ - DOSED IN MG/24 HR) 7 mg/24hr patch Place 7 mg onto the skin daily.    Marland Kitchen nystatin (MYCOSTATIN) 100000 UNIT/ML suspension Take 5 mLs by mouth 4 (four) times daily.    Marland Kitchen omeprazole (PRILOSEC) 20 MG capsule Take 20 mg by mouth daily.    . potassium chloride SA (K-DUR,KLOR-CON) 20 MEQ tablet Take 40 mEq by mouth daily.    Marland Kitchen senna (SENOKOT) 8.6 MG tablet Take 8.6 mg by mouth 2 (two) times daily.    . traZODone (DESYREL) 100 MG tablet Take 100 mg by mouth at bedtime.    Marland Kitchen venlafaxine XR (EFFEXOR-XR) 150 MG 24 hr capsule Take  150 mg by mouth daily with breakfast.    . buPROPion (WELLBUTRIN XL) 300 MG 24 hr tablet Take 300 mg by mouth daily.    . Fluticasone-Salmeterol (ADVAIR) 250-50 MCG/DOSE AEPB Inhale 1 puff into the lungs 2 (two) times daily.     No current  facility-administered medications on file prior to visit.     There are no Patient Instructions on file for this visit. No follow-ups on file.   Georgiana SpinnerFallon E Treasure Ingrum, NP  This note was completed with Office managerDragon Dictation.  Any errors are purely unintentional.

## 2018-03-23 ENCOUNTER — Ambulatory Visit (INDEPENDENT_AMBULATORY_CARE_PROVIDER_SITE_OTHER): Payer: Medicaid Other | Admitting: Nurse Practitioner

## 2018-03-23 ENCOUNTER — Encounter (INDEPENDENT_AMBULATORY_CARE_PROVIDER_SITE_OTHER): Payer: Self-pay | Admitting: Nurse Practitioner

## 2018-03-23 VITALS — BP 127/86 | HR 83 | Resp 16

## 2018-03-23 DIAGNOSIS — I890 Lymphedema, not elsewhere classified: Secondary | ICD-10-CM

## 2018-03-23 DIAGNOSIS — I872 Venous insufficiency (chronic) (peripheral): Secondary | ICD-10-CM | POA: Diagnosis not present

## 2018-03-23 DIAGNOSIS — L97929 Non-pressure chronic ulcer of unspecified part of left lower leg with unspecified severity: Secondary | ICD-10-CM

## 2018-03-23 DIAGNOSIS — L97919 Non-pressure chronic ulcer of unspecified part of right lower leg with unspecified severity: Secondary | ICD-10-CM | POA: Diagnosis not present

## 2018-03-23 DIAGNOSIS — K219 Gastro-esophageal reflux disease without esophagitis: Secondary | ICD-10-CM | POA: Diagnosis not present

## 2018-03-23 NOTE — Progress Notes (Signed)
Subjective:    Patient ID: Melanie Berry, female    DOB: 05/01/1965, 53 y.o.   MRN: [id number] Chief Complaint  Patient presents with  . Follow-up    HPI  Melanie Berry is a 53 y.o. female that presents today for evaluation of lower extremity wounds after bilateral Unna wraps.  The patient initially presented with several lower extremity ulcerations with extremely edematous lower extremities.  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  The patient spends majority of her day in a dependent position.  The patient has been in bilateral Unna wraps for 2 to 3 months.  Today her legs are much less swollen.  She did have a little wound after removal of a wraps due to the dryness of them.  The patient had compression socks with her today.  The patient denies any fever, chills, nausea, vomiting or diarrhea.  Patient denies any chest pain or shortness of breath.   Past Medical History:  Diagnosis Date  . Anemia   . Charcot's arthropathy   . COPD (chronic obstructive pulmonary disease) (HCC)   . COPD (chronic obstructive pulmonary disease) (HCC)   . Fluid overload   . GERD (gastroesophageal reflux disease)   . MDD (major depressive disorder)     Past Surgical History:  Procedure Laterality Date  . APPENDECTOMY    . CHOLECYSTECTOMY      Social History   Socioeconomic History  . Marital status: Married    Spouse name: Not on file  . Number of children: Not on file  . Years of education: Not on file  . Highest education level: Not on file  Occupational History  . Not on file  Social Needs  . Financial resource strain: Not on file  . Food insecurity:    Worry: Not on file    Inability: Not on file  . Transportation needs:    Medical: Not on file    Non-medical: Not on file  Tobacco Use  . Smoking status: Former Games developermoker  . Smokeless tobacco: Never Used  Substance and Sexual Activity  . Alcohol use: No  . Drug use: No  . Sexual  activity: Not on file  Lifestyle  . Physical activity:    Days per week: Not on file    Minutes per session: Not on file  . Stress: Not on file  Relationships  . Social connections:    Talks on phone: Not on file    Gets together: Not on file    Attends religious service: Not on file    Active member of club or organization: Not on file    Attends meetings of clubs or organizations: Not on file    Relationship status: Not on file  . Intimate partner violence:    Fear of current or ex partner: Not on file    Emotionally abused: Not on file    Physically abused: Not on file    Forced sexual activity: Not on file  Other Topics Concern  . Not on file  Social History Narrative  . Not on file    Family History  Family history unknown: Yes    Allergies  Allergen Reactions  . Pregabalin Swelling  . Shellfish-Derived Products Swelling  . Aspirin     REACTION: Unknown reaction  . Clarithromycin     REACTION: Unkown reaction  . Codeine Other (See Comments)    Unknown reaction  . Contrast Media [Iodinated Diagnostic Agents]  Other (See Comments)    unknown  . Fentanyl     REACTION: Unknown reaction  . Gabapentin     REACTION: Unknown reaction  . Methocarbamol     REACTION: Unknown reaction  . Nicotine     REACTION: Allergic to patch only Unknown reaction  . Nortriptyline Hcl     REACTION: Unknown reaction  . Nortriptyline Hcl     Other reaction(s): Unknown REACTION: Unknown reaction  . Potassium Iodide     REACTION: Unknown reaction  . Propoxyphene N-Acetaminophen     REACTION: Unknown reaction  . Sulfonamide Derivatives     REACTION: Unknown reaction  . Tramadol Hcl     REACTION: Severe headaches     Review of Systems   Review of Systems: Negative Unless Checked Constitutional: [] Weight loss  [] Fever  [] Chills Cardiac: [] Chest pain   []  Atrial Fibrillation  [] Palpitations   [] Shortness of breath when laying flat   [] Shortness of breath with exertion.  [] Shortness of breath at rest Vascular:  [] Pain in legs with walking   [] Pain in legs with standing [] Pain in legs when laying flat   [] Claudication    [] Pain in feet when laying flat    [] History of DVT   [] Phlebitis   [x] Swelling in legs   [] Varicose veins   [] Non-healing ulcers Pulmonary:   [] Uses home oxygen   [] Productive cough   [] Hemoptysis   [] Wheeze  [] COPD   [] Asthma Neurologic:  [] Dizziness   [] Seizures  [] Blackouts [x] History of stroke   [] History of TIA  [] Aphasia   [] Temporary Blindness   [x] Weakness or numbness in arm   [x] Weakness or numbness in leg Musculoskeletal:   [] Joint swelling   [] Joint pain   [] Low back pain  []  History of Knee Replacement [] Arthritis [] back Surgeries  []  Spinal Stenosis    Hematologic:  [] Easy bruising  [] Easy bleeding   [] Hypercoagulable state   [] Anemic Gastrointestinal:  [] Diarrhea   [] Vomiting  [x] Gastroesophageal reflux/heartburn   [] Difficulty swallowing. [] Abdominal pain Genitourinary:  [] Chronic kidney disease   [] Difficult urination  [] Anuric   [] Blood in urine [] Frequent urination  [] Burning with urination   [] Hematuria Skin:  [] Rashes   [] Ulcers [x] Wounds Psychological:  [] History of anxiety   [x]  History of major depression  []  Memory Difficulties     Objective:   Physical Exam  BP 127/86 (BP Location: Left Arm, Patient Position: Sitting)   Pulse 83   Resp 16   Gen: WD/WN, NAD Head: [place]/AT, No temporalis wasting.  Ear/Nose/Throat: Hearing grossly intact, nares w/o erythema or drainage Eyes: PER, EOMI, sclera nonicteric.  Neck: Supple, no masses.  No JVD.  Pulmonary:  Good air movement, no use of accessory muscles.  Cardiac: RRR Vascular:  1+ edema bilaterally Vessel Right Left  Radial Palpable Palpable  Dorsalis Pedis Not Palpable Not Palpable  Posterior Tibial Not Palpable Not Palpable   Gastrointestinal: soft, non-distended. No guarding/no peritoneal signs.  Musculoskeletal: M/S 5/5 throughout.  No deformity or atrophy.    Neurologic: Pain and light touch intact in extremities.  Symmetrical.  Speech is fluent. Motor exam as listed above. Psychiatric: Judgment intact, Mood & affect appropriate for pt's clinical situation. Dermatologic: No Venous rashes. No Ulcers Noted.  No changes consistent with cellulitis. Lymph : No Cervical lymphadenopathy, bilateral lichenification present      Assessment & Plan:   1. Lymphedema Recommend:  No surgery or intervention at this point in time.    I have reviewed my previous discussion with the patient regarding  swelling and why it causes symptoms.  Patient will continue wearing graduated compression stockings class 1 (20-30 mmHg) on a daily basis. The patient will  beginning wearing the stockings first thing in the morning and removing them in the evening. The patient is instructed specifically not to sleep in the stockings.    In addition, behavioral modification including several periods of elevation of the lower extremities during the day will be continued.  This was reviewed with the patient during the initial visit.  The patient will also continue routine exercise, especially walking on a daily basis as was discussed during the initial visit.    Despite conservative treatments including graduated compression therapy class 1 and behavioral modification including exercise and elevation the patient  has not obtained adequate control of the lymphedema.  The patient still has stage 3 lymphedema and therefore, I believe that a lymph pump should be added to improve the control of the patient's lymphedema.  Additionally, a lymph pump is warranted because it will reduce the risk of cellulitis and ulceration in the future.  Patient should follow-up in six months    2. Venous ulcers of both lower extremities (HCC) Patient lower extremity ulcerations have resolved due to Unna wrap therapy.  The patient suffered small skin tears after removing very dry Unna wraps.  We will use  topical wound treatment for these.  We also placed the patient in TED hose today before leaving the office.  The patient will continue to utilize medical grade 1 compression therapy as outlined above.  Patient will follow-up in 6 weeks for wound evaluation.  3. Gastroesophageal reflux disease, esophagitis presence not specified Continue PPI as already ordered, this medication has been reviewed and there are no changes at this time.  Avoidence of caffeine and alcohol  Moderate elevation of the head of the bed    Current Outpatient Medications on File Prior to Visit  Medication Sig Dispense Refill  . acetaminophen (TYLENOL) 325 MG tablet Take 650 mg by mouth every 6 (six) hours as needed for moderate pain.    . ARIPIPRAZOLE PO Take 7 mg by mouth. Takes 7mg  by mouth one time a day    . azelastine (ASTELIN) 0.1 % nasal spray Place 2 sprays into both nostrils 2 (two) times daily. Use in each nostril as directed    . Cholecalciferol (VITAMIN D3) 50000 units CAPS Take 50,000 Units by mouth every 30 (thirty) days.    . ciclopirox (LOPROX) 0.77 % cream Apply topically 2 (two) times daily. Apply bilateral toenails topically every day shift for toenail fungus    . clonazePAM (KLONOPIN) 0.5 MG tablet Take 0.5 mg by mouth 2 (two) times daily as needed for anxiety.    . fluticasone (FLONASE) 50 MCG/ACT nasal spray Place 2 sprays into both nostrils daily.    Marland Kitchen HYDROcodone-acetaminophen (NORCO) 10-325 MG tablet Take 1 tablet by mouth every 6 (six) hours as needed.    Marland Kitchen ipratropium-albuterol (DUONEB) 0.5-2.5 (3) MG/3ML SOLN Take 3 mLs by nebulization every 6 (six) hours as needed.    . Melatonin 3 MG TABS Take by mouth.    . meloxicam (MOBIC) 7.5 MG tablet Take 1 tablet (7.5 mg total) by mouth daily. 20 tablet 0  . Menthol, Topical Analgesic, (BIOFREEZE EX) Apply 1 application topically every 8 (eight) hours as needed. Apply to left shoulder for pain    . menthol-cetylpyridinium (CEPACOL) 3 MG lozenge Take  1 lozenge by mouth every hour as needed for sore throat.    Marland Kitchen  montelukast (SINGULAIR) 10 MG tablet Take 10 mg by mouth at bedtime.    Marland Kitchen. nystatin (MYCOSTATIN) 100000 UNIT/ML suspension Take 5 mLs by mouth 4 (four) times daily.    Marland Kitchen. omeprazole (PRILOSEC) 20 MG capsule Take 20 mg by mouth daily.    . potassium chloride SA (K-DUR,KLOR-CON) 20 MEQ tablet Take 40 mEq by mouth daily.    Marland Kitchen. senna (SENOKOT) 8.6 MG tablet Take 8.6 mg by mouth 2 (two) times daily.    . traZODone (DESYREL) 100 MG tablet Take 100 mg by mouth at bedtime.    Marland Kitchen. venlafaxine XR (EFFEXOR-XR) 150 MG 24 hr capsule Take 150 mg by mouth daily with breakfast.     No current facility-administered medications on file prior to visit.     There are no Patient Instructions on file for this visit. No follow-ups on file.   Georgiana SpinnerFallon E Brown, NP  This note was completed with Office managerDragon Dictation.  Any errors are purely unintentional.

## 2018-03-25 ENCOUNTER — Telehealth (INDEPENDENT_AMBULATORY_CARE_PROVIDER_SITE_OTHER): Payer: Self-pay

## 2018-03-25 NOTE — Telephone Encounter (Signed)
Talbert Forest called and stated that she received a call from a manufacturer asking about sending the patient a compression pump. Is that something she is still pursuing or is the patient to stay in the unna boots or do both. Talbert Forest is unsure what we are asking of [facility] at this time.

## 2018-03-25 NOTE — Telephone Encounter (Signed)
Spoke with Talbert Forest, and informed her of Fallon's message. She understood and had no additional questions at this time.

## 2018-03-25 NOTE — Telephone Encounter (Signed)
Yes we are doing both..the pump can we used in conjunction with the boots.  Also, have they tried measuring her TED hose? The ones that she had looked small.  She may need a different size

## 2018-03-25 NOTE — Telephone Encounter (Signed)
Talbert Forest from [facility] called because the patient stated the ted hose are to tight and make her legs hurt so she would like to go back in her unna boots and have them changed on Monday and Friday. Please advise.

## 2018-03-25 NOTE — Telephone Encounter (Signed)
Melanie Berry is aware of below and states that the patient was measured for the TED hose and given a XL but the patient stated she still could not tolerate them.

## 2018-03-25 NOTE — Telephone Encounter (Signed)
That is fine...you can give them a verbal order from me per Dr. Wyn Quaker

## 2018-04-27 ENCOUNTER — Telehealth (INDEPENDENT_AMBULATORY_CARE_PROVIDER_SITE_OTHER): Payer: Self-pay | Admitting: Nurse Practitioner

## 2018-04-27 NOTE — Telephone Encounter (Signed)
You can have her come in (it doesn't need to be today) to check leg swelling.

## 2018-04-30 ENCOUNTER — Ambulatory Visit (INDEPENDENT_AMBULATORY_CARE_PROVIDER_SITE_OTHER): Payer: Medicaid Other | Admitting: Nurse Practitioner

## 2018-04-30 ENCOUNTER — Encounter (INDEPENDENT_AMBULATORY_CARE_PROVIDER_SITE_OTHER): Payer: Self-pay | Admitting: Nurse Practitioner

## 2018-04-30 ENCOUNTER — Other Ambulatory Visit: Payer: Self-pay

## 2018-04-30 VITALS — BP 129/74 | HR 82 | Resp 14 | Ht 64.0 in | Wt 239.0 lb

## 2018-04-30 DIAGNOSIS — E785 Hyperlipidemia, unspecified: Secondary | ICD-10-CM

## 2018-04-30 DIAGNOSIS — L97919 Non-pressure chronic ulcer of unspecified part of right lower leg with unspecified severity: Secondary | ICD-10-CM

## 2018-04-30 DIAGNOSIS — I872 Venous insufficiency (chronic) (peripheral): Secondary | ICD-10-CM

## 2018-04-30 DIAGNOSIS — I890 Lymphedema, not elsewhere classified: Secondary | ICD-10-CM

## 2018-04-30 DIAGNOSIS — Z87891 Personal history of nicotine dependence: Secondary | ICD-10-CM

## 2018-04-30 DIAGNOSIS — L97929 Non-pressure chronic ulcer of unspecified part of left lower leg with unspecified severity: Secondary | ICD-10-CM

## 2018-04-30 DIAGNOSIS — Z79899 Other long term (current) drug therapy: Secondary | ICD-10-CM

## 2018-05-04 ENCOUNTER — Ambulatory Visit (INDEPENDENT_AMBULATORY_CARE_PROVIDER_SITE_OTHER): Payer: Medicaid Other | Admitting: Nurse Practitioner

## 2018-05-07 ENCOUNTER — Ambulatory Visit (INDEPENDENT_AMBULATORY_CARE_PROVIDER_SITE_OTHER): Payer: Medicaid Other | Admitting: Nurse Practitioner

## 2018-05-07 ENCOUNTER — Other Ambulatory Visit: Payer: Self-pay

## 2018-05-07 ENCOUNTER — Encounter (INDEPENDENT_AMBULATORY_CARE_PROVIDER_SITE_OTHER): Payer: Self-pay

## 2018-05-07 VITALS — BP 121/70 | HR 78 | Resp 16

## 2018-05-07 DIAGNOSIS — L97919 Non-pressure chronic ulcer of unspecified part of right lower leg with unspecified severity: Secondary | ICD-10-CM | POA: Diagnosis not present

## 2018-05-07 DIAGNOSIS — L97929 Non-pressure chronic ulcer of unspecified part of left lower leg with unspecified severity: Secondary | ICD-10-CM

## 2018-05-07 DIAGNOSIS — I872 Venous insufficiency (chronic) (peripheral): Secondary | ICD-10-CM

## 2018-05-07 NOTE — Progress Notes (Signed)
History of Present Illness  There is no documented history at this time  Assessments & Plan   There are no diagnoses linked to this encounter.    Additional instructions  Subjective:  Patient presents with venous ulcer of the Bilateral lower extremity.    Procedure:  3 layer unna wrap was placed Bilateral lower extremity.   Plan:   Follow up in one week.  

## 2018-05-10 ENCOUNTER — Encounter (INDEPENDENT_AMBULATORY_CARE_PROVIDER_SITE_OTHER): Payer: Self-pay | Admitting: Nurse Practitioner

## 2018-05-10 NOTE — Progress Notes (Signed)
SUBJECTIVE:  Patient ID: Melanie Berry, female    DOB: Jan 16, 1966, 53 y.o.   MRN: [id number] Chief Complaint  Patient presents with  . Follow-up    HPI  Melanie Berry is a 53 y.o. female that presents today for leg swelling and venous ulcerations.  At her last visit her leg swelling was well controlled and she had no further aspiration.  She was given instruction to have TED hose placed daily by her nursing facility.  The patient states that they would not place daily if they were placing were not placed properly.  She subsequently small and developed ulcerations.  These ulcerations are painful to the touch.  She denies any fever, chills, nausea, vomiting or diarrhea.  She denies any chest pain or shortness of breath.  Past Medical History:  Diagnosis Date  . Anemia   . Charcot's arthropathy   . COPD (chronic obstructive pulmonary disease) (HCC)   . COPD (chronic obstructive pulmonary disease) (HCC)   . Fluid overload   . GERD (gastroesophageal reflux disease)   . MDD (major depressive disorder)     Past Surgical History:  Procedure Laterality Date  . APPENDECTOMY    . CHOLECYSTECTOMY      Social History   Socioeconomic History  . Marital status: Married    Spouse name: Not on file  . Number of children: Not on file  . Years of education: Not on file  . Highest education level: Not on file  Occupational History  . Not on file  Social Needs  . Financial resource strain: Not on file  . Food insecurity:    Worry: Not on file    Inability: Not on file  . Transportation needs:    Medical: Not on file    Non-medical: Not on file  Tobacco Use  . Smoking status: Former Games developer  . Smokeless tobacco: Never Used  Substance and Sexual Activity  . Alcohol use: No  . Drug use: No  . Sexual activity: Not on file  Lifestyle  . Physical activity:    Days per week: Not on file    Minutes per session: Not on file  . Stress: Not on file  Relationships  . Social  connections:    Talks on phone: Not on file    Gets together: Not on file    Attends religious service: Not on file    Active member of club or organization: Not on file    Attends meetings of clubs or organizations: Not on file    Relationship status: Not on file  . Intimate partner violence:    Fear of current or ex partner: Not on file    Emotionally abused: Not on file    Physically abused: Not on file    Forced sexual activity: Not on file  Other Topics Concern  . Not on file  Social History Narrative  . Not on file    Family History  Family history unknown: Yes    Allergies  Allergen Reactions  . Pregabalin Swelling  . Shellfish-Derived Products Swelling  . Aspirin     REACTION: Unknown reaction  . Clarithromycin     REACTION: Unkown reaction  . Codeine Other (See Comments)    Unknown reaction  . Contrast Media [Iodinated Diagnostic Agents] Other (See Comments)    unknown  . Fentanyl     REACTION: Unknown reaction  . Gabapentin     REACTION: Unknown reaction  . Methocarbamol  REACTION: Unknown reaction  . Nicotine     REACTION: Allergic to patch only Unknown reaction  . Nortriptyline Hcl     REACTION: Unknown reaction  . Nortriptyline Hcl     Other reaction(s): Unknown REACTION: Unknown reaction  . Potassium Iodide     REACTION: Unknown reaction  . Propoxyphene N-Acetaminophen     REACTION: Unknown reaction  . Sulfonamide Derivatives     REACTION: Unknown reaction  . Tramadol Hcl     REACTION: Severe headaches     Review of Systems   Review of Systems: Negative Unless Checked Constitutional: [] Weight loss  [] Fever  [] Chills Cardiac: [] Chest pain   []  Atrial Fibrillation  [] Palpitations   [] Shortness of breath when laying flat   [] Shortness of breath with exertion. [] Shortness of breath at rest Vascular:  [] Pain in legs with walking   [] Pain in legs with standing [] Pain in legs when laying flat   [] Claudication    [] Pain in feet when laying flat     [] History of DVT   [] Phlebitis   [x] Swelling in legs   [] Varicose veins   [x] Non-healing ulcers Pulmonary:   [] Uses home oxygen   [] Productive cough   [] Hemoptysis   [] Wheeze  [] COPD   [] Asthma Neurologic:  [] Dizziness   [] Seizures  [] Blackouts [] History of stroke   [] History of TIA  [] Aphasia   [] Temporary Blindness   [x] Weakness or numbness in arm   [x] Weakness or numbness in leg Musculoskeletal:   [] Joint swelling   [] Joint pain   [] Low back pain  []  History of Knee Replacement [] Arthritis [] back Surgeries  []  Spinal Stenosis    Hematologic:  [] Easy bruising  [] Easy bleeding   [] Hypercoagulable state   [] Anemic Gastrointestinal:  [] Diarrhea   [] Vomiting  [] Gastroesophageal reflux/heartburn   [] Difficulty swallowing. [] Abdominal pain Genitourinary:  [] Chronic kidney disease   [] Difficult urination  [] Anuric   [] Blood in urine [] Frequent urination  [] Burning with urination   [] Hematuria Skin:  [] Rashes   [x] Ulcers [] Wounds Psychological:  [] History of anxiety   []  History of major depression  []  Memory Difficulties      OBJECTIVE:   Physical Exam  BP 129/74 (BP Location: Left Arm, Patient Position: Sitting, Cuff Size: Large)   Pulse 82   Resp 14   Ht 5\' 4"  (1.626 m)   Wt 239 lb (108.4 kg)   BMI 41.02 kg/m   Gen: WD/WN, NAD Head: [place]/AT, No temporalis wasting.  Ear/Nose/Throat: Hearing grossly intact, nares w/o erythema or drainage Eyes: PER, EOMI, sclera nonicteric.  Neck: Supple, no masses.  No JVD.  Pulmonary:  Good air movement, no use of accessory muscles.  Cardiac: RRR Vascular:  4+ bilateral pitting edema with scattered ulcerations. Vessel Right Left  Radial Palpable Palpable  Gastrointestinal: soft, non-distended. No guarding/no peritoneal signs.  Musculoskeletal: Wheelchair-bound, multiple ulcerations, Charcot foot with left-sided weakness Neurologic: Pain and light touch intact in extremities.  Symmetrical.  Speech is fluent. Motor exam as listed above. Psychiatric:  Judgment intact, Mood & affect appropriate for pt's clinical situation. Dermatologic:  No changes consistent with cellulitis. Lymph : No Cervical lymphadenopathy, no lichenification or skin changes of chronic lymphedema.       ASSESSMENT AND PLAN:  1. Venous ulcers of both lower extremities (HCC) The patient will have Unna wraps placed on her lower extremities to have been changed weekly at our office.  Typically we have been allowing the nursing home to change her wrappings however they were consistently done improperly resulting in worsening of her  wounds and swelling.  We will change the wraps weekly for 4 weeks and will reevaluate her progression with Unna wrap therapy.  2. Lymphedema No surgery or intervention at this point in time.    I have reviewed my discussion with the patient regarding venous insufficiency and secondary lymph edema and why it  causes symptoms. I have discussed with the patient the chronic skin changes that accompany these problems and the long term sequela such as ulceration and infection.  Patient will continue wearing graduated compression stockings class 1 (20-30 mmHg) on a daily basis a prescription was given to the patient to keep this updated. The patient will  put the stockings on first thing in the morning and removing them in the evening. The patient is instructed specifically not to sleep in the stockings.  In addition, behavioral modification including elevation during the day will be continued.  Diet and salt restriction was also discussed.   3. Hyperlipidemia, unspecified hyperlipidemia type Continue statin as ordered and reviewed, no changes at this time    Current Outpatient Medications on File Prior to Visit  Medication Sig Dispense Refill  . acetaminophen (TYLENOL) 325 MG tablet Take 650 mg by mouth every 6 (six) hours as needed for moderate pain.    . ARIPIPRAZOLE PO Take 7 mg by mouth. Takes 7mg  by mouth one time a day    . azelastine (ASTELIN)  0.1 % nasal spray Place 2 sprays into both nostrils 2 (two) times daily. Use in each nostril as directed    . Cholecalciferol (VITAMIN D3) 50000 units CAPS Take 50,000 Units by mouth every 30 (thirty) days.    . ciclopirox (LOPROX) 0.77 % cream Apply topically 2 (two) times daily. Apply bilateral toenails topically every day shift for toenail fungus    . clonazePAM (KLONOPIN) 0.5 MG tablet Take 0.5 mg by mouth 2 (two) times daily as needed for anxiety.    . fluticasone (FLONASE) 50 MCG/ACT nasal spray Place 2 sprays into both nostrils daily.    Marland Kitchen HYDROcodone-acetaminophen (NORCO) 10-325 MG tablet Take 1 tablet by mouth every 6 (six) hours as needed.    Marland Kitchen ipratropium-albuterol (DUONEB) 0.5-2.5 (3) MG/3ML SOLN Take 3 mLs by nebulization every 6 (six) hours as needed.    . Melatonin 3 MG TABS Take by mouth.    . meloxicam (MOBIC) 7.5 MG tablet Take 1 tablet (7.5 mg total) by mouth daily. 20 tablet 0  . menthol-cetylpyridinium (CEPACOL) 3 MG lozenge Take 1 lozenge by mouth every hour as needed for sore throat.    . montelukast (SINGULAIR) 10 MG tablet Take 10 mg by mouth at bedtime.    Marland Kitchen nystatin (MYCOSTATIN) 100000 UNIT/ML suspension Take 5 mLs by mouth 4 (four) times daily.    Marland Kitchen omeprazole (PRILOSEC) 20 MG capsule Take 20 mg by mouth daily.    . potassium chloride SA (K-DUR,KLOR-CON) 20 MEQ tablet Take 40 mEq by mouth daily.    Marland Kitchen senna (SENOKOT) 8.6 MG tablet Take 8.6 mg by mouth 2 (two) times daily.    . traZODone (DESYREL) 100 MG tablet Take 100 mg by mouth at bedtime.    Marland Kitchen venlafaxine XR (EFFEXOR-XR) 150 MG 24 hr capsule Take 150 mg by mouth daily with breakfast.    . Menthol, Topical Analgesic, (BIOFREEZE EX) Apply 1 application topically every 8 (eight) hours as needed. Apply to left shoulder for pain     No current facility-administered medications on file prior to visit.     There are  no Patient Instructions on file for this visit. No follow-ups on file.   Georgiana Spinner, NP  This  note was completed with Office manager.  Any errors are purely unintentional.

## 2018-05-14 ENCOUNTER — Encounter (INDEPENDENT_AMBULATORY_CARE_PROVIDER_SITE_OTHER): Payer: Medicaid Other

## 2018-05-18 ENCOUNTER — Telehealth (INDEPENDENT_AMBULATORY_CARE_PROVIDER_SITE_OTHER): Payer: Self-pay

## 2018-05-18 NOTE — Telephone Encounter (Signed)
That is fine to have them changed twice a week.

## 2018-05-18 NOTE — Telephone Encounter (Signed)
Order has faxed [facility]

## 2018-05-21 ENCOUNTER — Ambulatory Visit (INDEPENDENT_AMBULATORY_CARE_PROVIDER_SITE_OTHER): Payer: Medicaid Other | Admitting: Nurse Practitioner

## 2019-05-11 ENCOUNTER — Emergency Department

## 2019-05-11 ENCOUNTER — Inpatient Hospital Stay
Admission: EM | Admit: 2019-05-11 | Discharge: 2019-05-17 | DRG: 871 | Disposition: A | Discharge status: Skilled Nursing Facility | Source: Skilled Nursing Facility | Attending: Internal Medicine | Admitting: Internal Medicine

## 2019-05-11 ENCOUNTER — Other Ambulatory Visit: Payer: Self-pay

## 2019-05-11 DIAGNOSIS — G600 Hereditary motor and sensory neuropathy: Secondary | ICD-10-CM | POA: Diagnosis present

## 2019-05-11 DIAGNOSIS — I890 Lymphedema, not elsewhere classified: Secondary | ICD-10-CM

## 2019-05-11 DIAGNOSIS — Y9210 Unspecified residential institution as the place of occurrence of the external cause: Secondary | ICD-10-CM

## 2019-05-11 DIAGNOSIS — G4730 Sleep apnea, unspecified: Secondary | ICD-10-CM | POA: Diagnosis present

## 2019-05-11 DIAGNOSIS — Z20822 Contact with and (suspected) exposure to covid-19: Secondary | ICD-10-CM | POA: Diagnosis present

## 2019-05-11 DIAGNOSIS — Z881 Allergy status to other antibiotic agents status: Secondary | ICD-10-CM

## 2019-05-11 DIAGNOSIS — Z79899 Other long term (current) drug therapy: Secondary | ICD-10-CM

## 2019-05-11 DIAGNOSIS — F329 Major depressive disorder, single episode, unspecified: Secondary | ICD-10-CM | POA: Diagnosis present

## 2019-05-11 DIAGNOSIS — Z91041 Radiographic dye allergy status: Secondary | ICD-10-CM

## 2019-05-11 DIAGNOSIS — W1830XA Fall on same level, unspecified, initial encounter: Secondary | ICD-10-CM | POA: Diagnosis present

## 2019-05-11 DIAGNOSIS — L97919 Non-pressure chronic ulcer of unspecified part of right lower leg with unspecified severity: Secondary | ICD-10-CM | POA: Diagnosis present

## 2019-05-11 DIAGNOSIS — Z885 Allergy status to narcotic agent status: Secondary | ICD-10-CM

## 2019-05-11 DIAGNOSIS — Z91013 Allergy to seafood: Secondary | ICD-10-CM

## 2019-05-11 DIAGNOSIS — L03116 Cellulitis of left lower limb: Secondary | ICD-10-CM | POA: Diagnosis present

## 2019-05-11 DIAGNOSIS — S72001A Fracture of unspecified part of neck of right femur, initial encounter for closed fracture: Secondary | ICD-10-CM | POA: Diagnosis not present

## 2019-05-11 DIAGNOSIS — Z888 Allergy status to other drugs, medicaments and biological substances status: Secondary | ICD-10-CM

## 2019-05-11 DIAGNOSIS — Z515 Encounter for palliative care: Secondary | ICD-10-CM

## 2019-05-11 DIAGNOSIS — Z882 Allergy status to sulfonamides status: Secondary | ICD-10-CM

## 2019-05-11 DIAGNOSIS — L97929 Non-pressure chronic ulcer of unspecified part of left lower leg with unspecified severity: Secondary | ICD-10-CM | POA: Diagnosis present

## 2019-05-11 DIAGNOSIS — E66813 Obesity, class 3: Secondary | ICD-10-CM

## 2019-05-11 DIAGNOSIS — Z6841 Body Mass Index (BMI) 40.0 and over, adult: Secondary | ICD-10-CM

## 2019-05-11 DIAGNOSIS — R0902 Hypoxemia: Secondary | ICD-10-CM

## 2019-05-11 DIAGNOSIS — Z7189 Other specified counseling: Secondary | ICD-10-CM

## 2019-05-11 DIAGNOSIS — Z87891 Personal history of nicotine dependence: Secondary | ICD-10-CM

## 2019-05-11 DIAGNOSIS — R532 Functional quadriplegia: Secondary | ICD-10-CM

## 2019-05-11 DIAGNOSIS — E876 Hypokalemia: Secondary | ICD-10-CM | POA: Diagnosis not present

## 2019-05-11 DIAGNOSIS — A419 Sepsis, unspecified organism: Principal | ICD-10-CM

## 2019-05-11 DIAGNOSIS — Z66 Do not resuscitate: Secondary | ICD-10-CM | POA: Diagnosis present

## 2019-05-11 DIAGNOSIS — F419 Anxiety disorder, unspecified: Secondary | ICD-10-CM | POA: Diagnosis present

## 2019-05-11 DIAGNOSIS — M1460 Charcot's joint, unspecified site: Secondary | ICD-10-CM | POA: Diagnosis present

## 2019-05-11 DIAGNOSIS — J449 Chronic obstructive pulmonary disease, unspecified: Secondary | ICD-10-CM

## 2019-05-11 DIAGNOSIS — R0602 Shortness of breath: Secondary | ICD-10-CM

## 2019-05-11 DIAGNOSIS — W19XXXA Unspecified fall, initial encounter: Secondary | ICD-10-CM

## 2019-05-11 DIAGNOSIS — Z886 Allergy status to analgesic agent status: Secondary | ICD-10-CM

## 2019-05-11 DIAGNOSIS — Z7401 Bed confinement status: Secondary | ICD-10-CM

## 2019-05-11 DIAGNOSIS — K219 Gastro-esophageal reflux disease without esophagitis: Secondary | ICD-10-CM | POA: Diagnosis present

## 2019-05-11 DIAGNOSIS — E785 Hyperlipidemia, unspecified: Secondary | ICD-10-CM | POA: Diagnosis present

## 2019-05-11 DIAGNOSIS — L03115 Cellulitis of right lower limb: Secondary | ICD-10-CM

## 2019-05-11 DIAGNOSIS — Z9981 Dependence on supplemental oxygen: Secondary | ICD-10-CM

## 2019-05-11 DIAGNOSIS — G35 Multiple sclerosis: Secondary | ICD-10-CM | POA: Diagnosis present

## 2019-05-11 LAB — URINALYSIS, COMPLETE (UACMP) WITH MICROSCOPIC
Bacteria, UA: NONE SEEN
Bilirubin Urine: NEGATIVE
Glucose, UA: NEGATIVE mg/dL
Ketones, ur: NEGATIVE mg/dL
Leukocytes,Ua: NEGATIVE
Nitrite: NEGATIVE
Protein, ur: NEGATIVE mg/dL
Specific Gravity, Urine: 1.011 (ref 1.005–1.030)
pH: 6 (ref 5.0–8.0)

## 2019-05-11 LAB — CBC WITH DIFFERENTIAL/PLATELET
Abs Immature Granulocytes: 0.03 10*3/uL (ref 0.00–0.07)
Basophils Absolute: 0.1 10*3/uL (ref 0.0–0.1)
Basophils Relative: 1 %
Eosinophils Absolute: 0.3 10*3/uL (ref 0.0–0.5)
Eosinophils Relative: 2 %
HCT: 37.9 % (ref 36.0–46.0)
Hemoglobin: 11.4 g/dL — ABNORMAL LOW (ref 12.0–15.0)
Immature Granulocytes: 0 %
Lymphocytes Relative: 15 %
Lymphs Abs: 1.9 10*3/uL (ref 0.7–4.0)
MCH: 26.8 pg (ref 26.0–34.0)
MCHC: 30.1 g/dL (ref 30.0–36.0)
MCV: 89.2 fL (ref 80.0–100.0)
Monocytes Absolute: 0.6 10*3/uL (ref 0.1–1.0)
Monocytes Relative: 5 %
Neutro Abs: 9.6 10*3/uL — ABNORMAL HIGH (ref 1.7–7.7)
Neutrophils Relative %: 77 %
Platelets: 411 10*3/uL — ABNORMAL HIGH (ref 150–400)
RBC: 4.25 MIL/uL (ref 3.87–5.11)
RDW: 17.1 % — ABNORMAL HIGH (ref 11.5–15.5)
WBC: 12.4 10*3/uL — ABNORMAL HIGH (ref 4.0–10.5)
nRBC: 0 % (ref 0.0–0.2)

## 2019-05-11 LAB — COMPREHENSIVE METABOLIC PANEL
ALT: 19 U/L (ref 0–44)
AST: 33 U/L (ref 15–41)
Albumin: 3.4 g/dL — ABNORMAL LOW (ref 3.5–5.0)
Alkaline Phosphatase: 86 U/L (ref 38–126)
Anion gap: 11 (ref 5–15)
BUN: 13 mg/dL (ref 6–20)
CO2: 31 mmol/L (ref 22–32)
Calcium: 9 mg/dL (ref 8.9–10.3)
Chloride: 98 mmol/L (ref 98–111)
Creatinine, Ser: 0.54 mg/dL (ref 0.44–1.00)
GFR calc Af Amer: >60 mL/min (ref >60)
GFR calc non Af Amer: >60 mL/min (ref >60)
Glucose, Bld: 118 mg/dL — ABNORMAL HIGH (ref 70–99)
Potassium: 4.2 mmol/L (ref 3.5–5.1)
Sodium: 140 mmol/L (ref 135–145)
Total Bilirubin: 0.7 mg/dL (ref 0.3–1.2)
Total Protein: 7 g/dL (ref 6.5–8.1)

## 2019-05-11 LAB — PROTIME-INR
INR: 1 (ref 0.8–1.2)
Prothrombin Time: 13.3 seconds (ref 11.4–15.2)

## 2019-05-11 LAB — LACTIC ACID, PLASMA: Lactic Acid, Venous: 2.7 mmol/L (ref 0.5–1.9)

## 2019-05-11 LAB — PROCALCITONIN: Procalcitonin: <0.1 ng/mL

## 2019-05-11 MED ORDER — VANCOMYCIN HCL 2000 MG/400ML IV SOLN
2000.0000 mg | Freq: Once | INTRAVENOUS | Status: AC
  Administered 2019-05-11: 2000 mg via INTRAVENOUS
  Filled 2019-05-11: qty 400

## 2019-05-11 MED ORDER — LACTATED RINGERS IV SOLN: INTRAVENOUS | Status: DC

## 2019-05-11 MED ORDER — ONDANSETRON HCL 4 MG/2ML IJ SOLN
4.0000 mg | Freq: Once | INTRAMUSCULAR | Status: AC
  Administered 2019-05-11: 22:00:00 4 mg via INTRAVENOUS
  Filled 2019-05-11: qty 2

## 2019-05-11 MED ORDER — VANCOMYCIN HCL IN DEXTROSE 1-5 GM/200ML-% IV SOLN: 1000.0000 mg | Freq: Once | INTRAVENOUS | Status: DC

## 2019-05-11 MED ORDER — IPRATROPIUM-ALBUTEROL 0.5-2.5 (3) MG/3ML IN SOLN
3.0000 mL | Freq: Once | RESPIRATORY_TRACT | Status: AC
  Administered 2019-05-11: 3 mL via RESPIRATORY_TRACT
  Filled 2019-05-11: qty 3

## 2019-05-11 MED ORDER — SODIUM CHLORIDE 0.9 % IV SOLN: Freq: Once | INTRAVENOUS | Status: DC

## 2019-05-11 MED ORDER — LIDOCAINE HCL (PF) 1 % IJ SOLN
INTRAMUSCULAR | Status: AC
  Filled 2019-05-11: qty 5

## 2019-05-11 MED ORDER — METRONIDAZOLE IN NACL 5-0.79 MG/ML-% IV SOLN
500.0000 mg | Freq: Once | INTRAVENOUS | Status: AC
  Administered 2019-05-11: 500 mg via INTRAVENOUS
  Filled 2019-05-11: qty 100

## 2019-05-11 MED ORDER — MORPHINE SULFATE (PF) 4 MG/ML IV SOLN
4.0000 mg | Freq: Once | INTRAVENOUS | Status: AC
  Administered 2019-05-11: 4 mg via INTRAVENOUS
  Filled 2019-05-11: qty 1

## 2019-05-11 MED ORDER — SODIUM CHLORIDE 0.9 % IV SOLN
2.0000 g | Freq: Once | INTRAVENOUS | Status: AC
  Administered 2019-05-11: 22:00:00 2 g via INTRAVENOUS
  Filled 2019-05-11: qty 2

## 2019-05-11 MED ORDER — SODIUM CHLORIDE 0.9 % IV BOLUS
500.0000 mL | Freq: Once | INTRAVENOUS | Status: AC
  Administered 2019-05-11: 500 mL via INTRAVENOUS

## 2019-05-11 MED ORDER — LIDOCAINE-PRILOCAINE 2.5-2.5 % EX CREA
TOPICAL_CREAM | CUTANEOUS | Status: AC
  Filled 2019-05-11: qty 5

## 2019-05-11 MED ORDER — LACTATED RINGERS IV BOLUS
500.0000 mL | Freq: Once | INTRAVENOUS | Status: AC
  Administered 2019-05-11: 500 mL via INTRAVENOUS

## 2019-05-11 NOTE — ED Notes (Signed)
Pt transported to CT scan.

## 2019-05-11 NOTE — ED Notes (Signed)
Rectal temperature 100.6

## 2019-05-11 NOTE — ED Provider Notes (Addendum)
[facility] Emergency Department Provider Note    First MD Initiated Contact with Patient 05/11/19 1933     (approximate)  I have reviewed the triage vital signs and the nursing notes.   HISTORY  Chief Complaint Fall    HPI Melanie Berry is a 54 y.o. female with extensive past medical history as listed below presents to the ER from facility after she sustained a unwitnessed fall where she states she was trying to get up fell landing on her right hip did hit her head.  Was unable to move after the fall due to severe right leg pain.  Reportedly had imaging done 6 hours prior that showed evidence of right femur fracture.  Denies any blood thinners.    Past Medical History:  Diagnosis Date  . Anemia   . Charcot's arthropathy   . COPD (chronic obstructive pulmonary disease) ([place])   . COPD (chronic obstructive pulmonary disease) ([place])   . Fluid overload   . GERD (gastroesophageal reflux disease)   . MDD (major depressive disorder)    Family History  Family history unknown: Yes   Past Surgical History:  Procedure Laterality Date  . APPENDECTOMY    . CHOLECYSTECTOMY     Patient Active Problem List   Diagnosis Date Noted  . Lymphedema 01/05/2018  . Venous ulcers of both lower extremities ([place]) 01/05/2018  . Pneumonia 11/15/2015  . URINARY INCONTINENCE 07/22/2006  . CYSTITIS, ACUTE 05/13/2006  . 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 CANDIDIASIS 01/06/2006  . Hyperlipidemia 01/06/2006  . HYPOKALEMIA 01/06/2006  . TOBACCO ABUSE 01/06/2006  . DEPRESSION 01/06/2006  . [place] DISEASE 01/06/2006  . Venous (peripheral) insufficiency 01/06/2006  . ALLERGIC RHINITIS 01/06/2006  . 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 TONGUE DISORDER 01/06/2006  . GERD 01/06/2006  . LOW BACK PAIN 01/06/2006  . SLEEP APNEA 01/06/2006  . LEG EDEMA 01/06/2006  . STATUS, ARTHRODESIS 01/06/2006      Prior to Admission medications     Medication Sig Start Date End Date Taking? Authorizing Provider  acetaminophen (TYLENOL) 325 MG tablet Take 650 mg by mouth every 6 (six) hours as needed for moderate pain.    [provider]  ARIPIPRAZOLE PO Take 7 mg by mouth. Takes 7mg  by mouth one time a day    [provider]  azelastine (ASTELIN) 0.1 % nasal spray Place 2 sprays into both nostrils 2 (two) times daily. Use in each nostril as directed    [provider]  Cholecalciferol (VITAMIN D3) 50000 units CAPS Take 50,000 Units by mouth every 30 (thirty) days.    [provider]  ciclopirox (LOPROX) 0.77 % cream Apply topically 2 (two) times daily. Apply bilateral toenails topically every day shift for toenail fungus    [provider]  clonazePAM (KLONOPIN) 0.5 MG tablet Take 0.5 mg by mouth 2 (two) times daily as needed for anxiety.    [provider]  fluticasone (FLONASE) 50 MCG/ACT nasal spray Place 2 sprays into both nostrils daily.    [provider]  HYDROcodone-acetaminophen (NORCO) 10-325 MG tablet Take 1 tablet by mouth every 6 (six) hours as needed.    [provider]  ipratropium-albuterol (DUONEB) 0.5-2.5 (3) MG/3ML SOLN Take 3 mLs by nebulization every 6 (six) hours as needed.    [provider]  Melatonin 3 MG TABS Take by mouth.    [provider]  meloxicam (MOBIC) 7.5 MG tablet Take 1 tablet (7.5 mg total) by mouth daily.  11/17/15   Altamese Dilling, MD  Menthol, Topical Analgesic, (BIOFREEZE EX) Apply 1 application topically every 8 (eight) hours as needed. Apply to left shoulder for pain    [provider]  menthol-cetylpyridinium (CEPACOL) 3 MG lozenge Take 1 lozenge by mouth every hour as needed for sore throat.    [provider]  montelukast (SINGULAIR) 10 MG tablet Take 10 mg by mouth at bedtime.    [provider]  nystatin (MYCOSTATIN) 100000 UNIT/ML suspension Take 5 mLs by mouth 4 (four)  times daily.    [provider]  omeprazole (PRILOSEC) 20 MG capsule Take 20 mg by mouth daily.    [provider]  potassium chloride SA (K-DUR,KLOR-CON) 20 MEQ tablet Take 40 mEq by mouth daily.    [provider]  senna (SENOKOT) 8.6 MG tablet Take 8.6 mg by mouth 2 (two) times daily.    [provider]  traZODone (DESYREL) 100 MG tablet Take 100 mg by mouth at bedtime.    [provider]  venlafaxine XR (EFFEXOR-XR) 150 MG 24 hr capsule Take 150 mg by mouth daily with breakfast.    [provider]    Allergies Pregabalin, Shellfish-derived products, Aspirin, Clarithromycin, Codeine, Contrast media [iodinated diagnostic agents], Fentanyl, Gabapentin, Methocarbamol, Nicotine, Nortriptyline hcl, Nortriptyline hcl, Potassium iodide, Propoxyphene n-acetaminophen, Sulfonamide derivatives, and Tramadol hcl    Social History Social History   Tobacco Use  . Smoking status: Former Games developer  . Smokeless tobacco: Never Used  Substance Use Topics  . Alcohol use: No  . Drug use: No    Review of Systems Patient denies headaches, rhinorrhea, blurry vision, numbness, shortness of breath, chest pain, edema, cough, abdominal pain, nausea, vomiting, diarrhea, dysuria, fevers, rashes or hallucinations unless otherwise stated above in HPI. ____________________________________________   PHYSICAL EXAM:  VITAL SIGNS: Vitals:   05/11/19 1947 05/11/19 2220  BP: (!) 145/87 109/80  Pulse: (!) 140 (!) 128  Resp: 20 16  Temp: 99.3 F (37.4 C)   SpO2: 94% 97%    Constitutional: chronically ill appearing,  Eyes: Conjunctivae are normal.  Head: Atraumatic. Nose: No congestion/rhinnorhea. Mouth/Throat: Mucous membranes are moist.   Neck: No stridor. Painless ROM.  Cardiovascular: Normal rate, regular rhythm. Grossly normal heart sounds.  Good peripheral circulation. Respiratory: Normal respiratory effort.  No retractions. Lungs diffuse scattered  wheeze, mild tachypnea Gastrointestinal: Soft, obese and nontender. No distention. No abdominal bruits. No CVA tenderness. Genitourinary:  Musculoskeletal:  Chronic wounds to BLE, pain with log roll of right hip.  Right thigh compartment is soft. Neurologic:  Normal speech and language. No new gross focal neurologic deficits are appreciated. No facial droop Skin:  Skin is warm, dry and intact. No rash noted. Psychiatric: Mood and affect are normal. Speech and behavior are normal.  ____________________________________________   LABS (all labs ordered are listed, but only abnormal results are displayed)  Results for orders placed or performed during the hospital encounter of 05/11/19 (from the past 24 hour(s))  Urinalysis, Complete w Microscopic     Status: Abnormal   Collection Time: 05/11/19  8:45 PM  Result Value Ref Range   Color, Urine YELLOW (A) YELLOW   APPearance HAZY (A) CLEAR   Specific Gravity, Urine 1.011 1.005 - 1.030   pH 6.0 5.0 - 8.0   Glucose, UA NEGATIVE NEGATIVE mg/dL   Hgb urine dipstick SMALL (A) NEGATIVE   Bilirubin Urine NEGATIVE NEGATIVE   Ketones, ur NEGATIVE NEGATIVE mg/dL   Protein, ur NEGATIVE NEGATIVE mg/dL  Nitrite NEGATIVE NEGATIVE   Leukocytes,Ua NEGATIVE NEGATIVE   RBC / HPF 0-5 0 - 5 RBC/hpf   WBC, UA 0-5 0 - 5 WBC/hpf   Bacteria, UA NONE SEEN NONE SEEN   Squamous Epithelial / LPF 0-5 0 - 5   Mucus PRESENT    Hyaline Casts, UA PRESENT   CBC with Differential/Platelet     Status: Abnormal   Collection Time: 05/11/19  9:17 PM  Result Value Ref Range   WBC 12.4 (H) 4.0 - 10.5 K/uL   RBC 4.25 3.87 - 5.11 MIL/uL   Hemoglobin 11.4 (L) 12.0 - 15.0 g/dL   HCT 16.1 09.6 - 04.5 %   MCV 89.2 80.0 - 100.0 fL   MCH 26.8 26.0 - 34.0 pg   MCHC 30.1 30.0 - 36.0 g/dL   RDW 40.9 (H) 81.1 - 91.4 %   Platelets 411 (H) 150 - 400 K/uL   nRBC 0.0 0.0 - 0.2 %   Neutrophils Relative % 77 %   Neutro Abs 9.6 (H) 1.7 - 7.7 K/uL   Lymphocytes Relative 15 %    Lymphs Abs 1.9 0.7 - 4.0 K/uL   Monocytes Relative 5 %   Monocytes Absolute 0.6 0.1 - 1.0 K/uL   Eosinophils Relative 2 %   Eosinophils Absolute 0.3 0.0 - 0.5 K/uL   Basophils Relative 1 %   Basophils Absolute 0.1 0.0 - 0.1 K/uL   Immature Granulocytes 0 %   Abs Immature Granulocytes 0.03 0.00 - 0.07 K/uL  Comprehensive metabolic panel     Status: Abnormal   Collection Time: 05/11/19  9:17 PM  Result Value Ref Range   Sodium 140 135 - 145 mmol/L   Potassium 4.2 3.5 - 5.1 mmol/L   Chloride 98 98 - 111 mmol/L   CO2 31 22 - 32 mmol/L   Glucose, Bld 118 (H) 70 - 99 mg/dL   BUN 13 6 - 20 mg/dL   Creatinine, Ser 7.82 0.44 - 1.00 mg/dL   Calcium 9.0 8.9 - 95.6 mg/dL   Total Protein 7.0 6.5 - 8.1 g/dL   Albumin 3.4 (L) 3.5 - 5.0 g/dL   AST 33 15 - 41 U/L   ALT 19 0 - 44 U/L   Alkaline Phosphatase 86 38 - 126 U/L   Total Bilirubin 0.7 0.3 - 1.2 mg/dL   GFR calc non Af Amer >60 >60 mL/min   GFR calc Af Amer >60 >60 mL/min   Anion gap 11 5 - 15  Lactic acid, plasma     Status: Abnormal   Collection Time: 05/11/19  9:17 PM  Result Value Ref Range   Lactic Acid, Venous 2.7 (HH) 0.5 - 1.9 mmol/L  Protime-INR     Status: None   Collection Time: 05/11/19  9:17 PM  Result Value Ref Range   Prothrombin Time 13.3 11.4 - 15.2 seconds   INR 1.0 0.8 - 1.2   ____________________________________________  EKG My review and personal interpretation at Time: 19:35   Indication: fall  Rate: 140  Rhythm: sinus Axis: normal Other: nonspecific st abn, no stemi ____________________________________________  RADIOLOGY  I personally reviewed all radiographic images ordered to evaluate for the above acute complaints and reviewed radiology reports and findings.  These findings were personally discussed with the patient.  Please see medical record for radiology report.  ____________________________________________   PROCEDURES  Procedure(s) performed:  .Critical Care Performed by: Willy Eddy, MD Authorized by: Willy Eddy, MD   Critical care provider statement:  Critical care time (minutes):  40   Critical care time was exclusive of:  Separately billable procedures and treating other patients   Critical care was necessary to treat or prevent imminent or life-threatening deterioration of the following conditions:  Sepsis   Critical care was time spent personally by me on the following activities:  Development of treatment plan with patient or surrogate, discussions with consultants, evaluation of patient's response to treatment, examination of patient, obtaining history from patient or surrogate, ordering and performing treatments and interventions, ordering and review of laboratory studies, ordering and review of radiographic studies, pulse oximetry, re-evaluation of patient's condition and review of old charts   Due to difficulty with obtaining IV access, a 20G peripheral IV catheter was inserted using US guidance into the LUE.  The site was prepped with chlorhexidine and allowed to dry.  The patient tolerated the procedure without any complications.    Critical Care performed: yes ____________________________________________   INITIAL IMPRESSION / ASSESSMENT AND PLAN / ED COURSE  Pertinent labs & imaging results that were available during my care of the patient were reviewed by me and considered in my medical decision making (see chart for details).   DDX: fracture, contusion, dislocation, uti, pna, copd, sepsis  Melanie Berry is a 54 y.o. who presents to the ED with presentation as described above.  Patient ill-appearing complaining of right hip pain reportedly with right hip fracture however warm to touch.  Does have cellulitic appearing lower extremities.  She is on her chronic O2.  Abdominal exam is soft and benign.  The patient will be placed on continuous pulse oximetry and telemetry for monitoring.  Laboratory evaluation will be sent to evaluate for the  above complaints.     Clinical Course as of May 11 2227  Tue May 11, 2019  2120 Core temp 100.6.  Will start empiric antibiotics.  Discussed case with Dr. Rosita Kea of orthopedics who   [PR]  2138 Her abdominal exam is benign.  Her legs however do have erythema and chronic wounds with some signs of cellulitis which I suspect are source of the patient's fever.  Abdominal exam obese is not specifically tender.  Also check for Covid.   [PR]  2159 Patient's lactate is mildly elevated.  We will continue with IV hydration however she does appear edematous and as she is not hypotensive we will give her her volume resuscitation over the next several hours and into her admission.  She is receiving IV antibiotics.  Will discuss hospitalist for admission.   [PR]    Clinical Course User Index [PR] Willy Eddy, MD    The patient was evaluated in Emergency Department today for the symptoms described in the history of present illness. He/she was evaluated in the context of the global COVID-19 pandemic, which necessitated consideration that the patient might be at risk for infection with the SARS-CoV-2 virus that causes COVID-19. Institutional protocols and algorithms that pertain to the evaluation of patients at risk for COVID-19 are in a state of rapid change based on information released by regulatory bodies including the CDC and federal and state organizations. These policies and algorithms were followed during the patient's care in the ED.  As part of my medical decision making, I reviewed the following data within the electronic MEDICAL RECORD NUMBER Nursing notes reviewed and incorporated, Labs reviewed, notes from prior ED visits and [place] Controlled Substance Database   ____________________________________________   FINAL CLINICAL IMPRESSION(S) / ED DIAGNOSES  Final diagnoses:  Sepsis, due to unspecified organism, unspecified whether acute organ dysfunction present [facility])  Closed fracture of right hip,  initial encounter (HCC)      NEW MEDICATIONS STARTED DURING THIS VISIT:  New Prescriptions   No medications on file     Note:  This document was prepared using Dragon voice recognition software and may include unintentional dictation errors.       Willy Eddy, MD 05/11/19 2229

## 2019-05-11 NOTE — ED Triage Notes (Signed)
Pt arrives [facility] from [facility] w cc of unwitnessed fall at 13:46. Pt states she fell out of bed trying to reach light. Xrays at facility showed closed R femur fracture. Facility reports giving 0.62mL morphine at 17:09pm   Pt on 2L [place] chronically

## 2019-05-11 NOTE — Progress Notes (Signed)
PHARMACY -  BRIEF ANTIBIOTIC NOTE   Pharmacy has received consult(s) for vancomycin from an ED provider.  The patient's profile has been reviewed for ht/wt/allergies/indication/available labs.    One time order(s) placed for vancomycin 2g IV load   Further antibiotics/pharmacy consults should be ordered by admitting physician if indicated.                       Thank you,  Thomasene Ripple, PharmD, BCPS Clinical Pharmacist 05/11/2019  9:26 PM

## 2019-05-12 DIAGNOSIS — G4730 Sleep apnea, unspecified: Secondary | ICD-10-CM | POA: Diagnosis present

## 2019-05-12 DIAGNOSIS — J449 Chronic obstructive pulmonary disease, unspecified: Secondary | ICD-10-CM | POA: Diagnosis present

## 2019-05-12 DIAGNOSIS — Z7189 Other specified counseling: Secondary | ICD-10-CM | POA: Diagnosis not present

## 2019-05-12 DIAGNOSIS — M1460 Charcot's joint, unspecified site: Secondary | ICD-10-CM | POA: Diagnosis present

## 2019-05-12 DIAGNOSIS — A419 Sepsis, unspecified organism: Principal | ICD-10-CM

## 2019-05-12 DIAGNOSIS — G600 Hereditary motor and sensory neuropathy: Secondary | ICD-10-CM | POA: Diagnosis not present

## 2019-05-12 DIAGNOSIS — S72001A Fracture of unspecified part of neck of right femur, initial encounter for closed fracture: Secondary | ICD-10-CM

## 2019-05-12 DIAGNOSIS — E785 Hyperlipidemia, unspecified: Secondary | ICD-10-CM | POA: Diagnosis present

## 2019-05-12 DIAGNOSIS — I890 Lymphedema, not elsewhere classified: Secondary | ICD-10-CM

## 2019-05-12 DIAGNOSIS — R532 Functional quadriplegia: Secondary | ICD-10-CM

## 2019-05-12 DIAGNOSIS — F329 Major depressive disorder, single episode, unspecified: Secondary | ICD-10-CM | POA: Diagnosis present

## 2019-05-12 DIAGNOSIS — Z882 Allergy status to sulfonamides status: Secondary | ICD-10-CM | POA: Diagnosis not present

## 2019-05-12 DIAGNOSIS — L03115 Cellulitis of right lower limb: Secondary | ICD-10-CM | POA: Diagnosis present

## 2019-05-12 DIAGNOSIS — K219 Gastro-esophageal reflux disease without esophagitis: Secondary | ICD-10-CM | POA: Diagnosis present

## 2019-05-12 DIAGNOSIS — Y92009 Unspecified place in unspecified non-institutional (private) residence as the place of occurrence of the external cause: Secondary | ICD-10-CM

## 2019-05-12 DIAGNOSIS — L97929 Non-pressure chronic ulcer of unspecified part of left lower leg with unspecified severity: Secondary | ICD-10-CM | POA: Diagnosis present

## 2019-05-12 DIAGNOSIS — W1830XA Fall on same level, unspecified, initial encounter: Secondary | ICD-10-CM | POA: Diagnosis present

## 2019-05-12 DIAGNOSIS — S72001P Fracture of unspecified part of neck of right femur, subsequent encounter for closed fracture with malunion: Secondary | ICD-10-CM | POA: Diagnosis not present

## 2019-05-12 DIAGNOSIS — W19XXXA Unspecified fall, initial encounter: Secondary | ICD-10-CM

## 2019-05-12 DIAGNOSIS — Z87891 Personal history of nicotine dependence: Secondary | ICD-10-CM | POA: Diagnosis not present

## 2019-05-12 DIAGNOSIS — L97919 Non-pressure chronic ulcer of unspecified part of right lower leg with unspecified severity: Secondary | ICD-10-CM | POA: Diagnosis present

## 2019-05-12 DIAGNOSIS — J439 Emphysema, unspecified: Secondary | ICD-10-CM | POA: Diagnosis not present

## 2019-05-12 DIAGNOSIS — Z20822 Contact with and (suspected) exposure to covid-19: Secondary | ICD-10-CM | POA: Diagnosis present

## 2019-05-12 DIAGNOSIS — Y9210 Unspecified residential institution as the place of occurrence of the external cause: Secondary | ICD-10-CM | POA: Diagnosis not present

## 2019-05-12 DIAGNOSIS — Z66 Do not resuscitate: Secondary | ICD-10-CM | POA: Diagnosis present

## 2019-05-12 DIAGNOSIS — L03116 Cellulitis of left lower limb: Secondary | ICD-10-CM | POA: Diagnosis present

## 2019-05-12 DIAGNOSIS — Z6841 Body Mass Index (BMI) 40.0 and over, adult: Secondary | ICD-10-CM | POA: Diagnosis not present

## 2019-05-12 DIAGNOSIS — Z79899 Other long term (current) drug therapy: Secondary | ICD-10-CM | POA: Diagnosis not present

## 2019-05-12 DIAGNOSIS — Z515 Encounter for palliative care: Secondary | ICD-10-CM | POA: Diagnosis not present

## 2019-05-12 LAB — CBC
HCT: 31.6 % — ABNORMAL LOW (ref 36.0–46.0)
Hemoglobin: 9.7 g/dL — ABNORMAL LOW (ref 12.0–15.0)
MCH: 27.1 pg (ref 26.0–34.0)
MCHC: 30.7 g/dL (ref 30.0–36.0)
MCV: 88.3 fL (ref 80.0–100.0)
Platelets: 350 10*3/uL (ref 150–400)
RBC: 3.58 MIL/uL — ABNORMAL LOW (ref 3.87–5.11)
RDW: 16.8 % — ABNORMAL HIGH (ref 11.5–15.5)
WBC: 11.9 10*3/uL — ABNORMAL HIGH (ref 4.0–10.5)
nRBC: 0 % (ref 0.0–0.2)

## 2019-05-12 LAB — SARS CORONAVIRUS 2 (TAT 6-24 HRS): SARS Coronavirus 2: NEGATIVE

## 2019-05-12 LAB — BASIC METABOLIC PANEL
Anion gap: 9 (ref 5–15)
BUN: 13 mg/dL (ref 6–20)
CO2: 32 mmol/L (ref 22–32)
Calcium: 8.3 mg/dL — ABNORMAL LOW (ref 8.9–10.3)
Chloride: 99 mmol/L (ref 98–111)
Creatinine, Ser: 0.5 mg/dL (ref 0.44–1.00)
GFR calc Af Amer: >60 mL/min (ref >60)
GFR calc non Af Amer: >60 mL/min (ref >60)
Glucose, Bld: 118 mg/dL — ABNORMAL HIGH (ref 70–99)
Potassium: 3.9 mmol/L (ref 3.5–5.1)
Sodium: 140 mmol/L (ref 135–145)

## 2019-05-12 LAB — URINE CULTURE: Culture: NO GROWTH

## 2019-05-12 LAB — LACTIC ACID, PLASMA
Lactic Acid, Venous: 2.1 mmol/L (ref 0.5–1.9)
Lactic Acid, Venous: 0.9 mmol/L (ref 0.5–1.9)

## 2019-05-12 LAB — HIV ANTIBODY (ROUTINE TESTING W REFLEX): HIV Screen 4th Generation wRfx: NONREACTIVE

## 2019-05-12 MED ORDER — RISAQUAD PO CAPS
2.0000 | ORAL_CAPSULE | Freq: Three times a day (TID) | ORAL | Status: DC
  Administered 2019-05-12 – 2019-05-17 (×15): 2 via ORAL
  Filled 2019-05-12 (×20): qty 2

## 2019-05-12 MED ORDER — AZELASTINE HCL 0.1 % NA SOLN
2.0000 | Freq: Two times a day (BID) | NASAL | Status: DC
  Administered 2019-05-12 – 2019-05-16 (×8): 2 via NASAL
  Filled 2019-05-12: qty 30

## 2019-05-12 MED ORDER — MORPHINE SULFATE (CONCENTRATE) 10 MG/0.5ML PO SOLN: 10.0000 mg | ORAL | Status: DC | PRN

## 2019-05-12 MED ORDER — SODIUM CHLORIDE 0.9 % IV SOLN: INTRAVENOUS | Status: DC

## 2019-05-12 MED ORDER — MICONAZOLE NITRATE POWD: 1.0000 "application " | Freq: Two times a day (BID) | Status: DC

## 2019-05-12 MED ORDER — MICONAZOLE NITRATE POWD
Freq: Two times a day (BID) | Status: DC
  Filled 2019-05-12: qty 100

## 2019-05-12 MED ORDER — VENLAFAXINE HCL ER 150 MG PO CP24
150.0000 mg | ORAL_CAPSULE | Freq: Every day | ORAL | Status: DC
  Administered 2019-05-13 – 2019-05-14 (×2): 150 mg via ORAL
  Filled 2019-05-12 (×2): qty 1

## 2019-05-12 MED ORDER — CICLOPIROX OLAMINE 0.77 % EX CREA: TOPICAL_CREAM | Freq: Two times a day (BID) | CUTANEOUS | Status: DC

## 2019-05-12 MED ORDER — SODIUM CHLORIDE 0.9 % IV SOLN
2.0000 g | INTRAVENOUS | Status: DC
  Administered 2019-05-12 – 2019-05-17 (×5): 2 g via INTRAVENOUS
  Filled 2019-05-12: qty 20
  Filled 2019-05-12: qty 2
  Filled 2019-05-12 (×4): qty 20

## 2019-05-12 MED ORDER — ENOXAPARIN SODIUM 40 MG/0.4ML ~~LOC~~ SOLN
40.0000 mg | Freq: Two times a day (BID) | SUBCUTANEOUS | Status: DC
  Administered 2019-05-12 – 2019-05-17 (×10): 40 mg via SUBCUTANEOUS
  Filled 2019-05-12 (×11): qty 0.4

## 2019-05-12 MED ORDER — HYDROCODONE-ACETAMINOPHEN 5-325 MG PO TABS
1.0000 | ORAL_TABLET | Freq: Four times a day (QID) | ORAL | Status: DC | PRN
  Administered 2019-05-12: 1 via ORAL
  Administered 2019-05-12: 2 via ORAL
  Administered 2019-05-13: 1 via ORAL
  Administered 2019-05-14: 2 via ORAL
  Administered 2019-05-14 – 2019-05-15 (×2): 1 via ORAL
  Administered 2019-05-16 – 2019-05-17 (×4): 2 via ORAL
  Filled 2019-05-12 (×2): qty 2
  Filled 2019-05-12: qty 1
  Filled 2019-05-12: qty 2
  Filled 2019-05-12 (×3): qty 1
  Filled 2019-05-12: qty 2
  Filled 2019-05-12: qty 1
  Filled 2019-05-12 (×2): qty 2

## 2019-05-12 MED ORDER — ENOXAPARIN SODIUM 40 MG/0.4ML ~~LOC~~ SOLN: 40.0000 mg | SUBCUTANEOUS | Status: DC

## 2019-05-12 MED ORDER — MORPHINE SULFATE ER 30 MG PO TBCR
30.0000 mg | EXTENDED_RELEASE_TABLET | Freq: Two times a day (BID) | ORAL | Status: DC
  Administered 2019-05-12 – 2019-05-17 (×10): 30 mg via ORAL
  Filled 2019-05-12 (×12): qty 1

## 2019-05-12 MED ORDER — CLONAZEPAM 1 MG PO TABS
1.0000 mg | ORAL_TABLET | Freq: Three times a day (TID) | ORAL | Status: DC
  Administered 2019-05-12 – 2019-05-13 (×4): 1 mg via ORAL
  Filled 2019-05-12 (×4): qty 1

## 2019-05-12 MED ORDER — ARIPIPRAZOLE 2 MG PO TABS
12.0000 mg | ORAL_TABLET | Freq: Every day | ORAL | Status: DC
  Administered 2019-05-12 – 2019-05-16 (×5): 12 mg via ORAL
  Filled 2019-05-12 (×6): qty 1

## 2019-05-12 MED ORDER — MORPHINE SULFATE (PF) 2 MG/ML IV SOLN
INTRAVENOUS | Status: AC
  Administered 2019-05-12: 2 mg via INTRAVENOUS
  Filled 2019-05-12: qty 1

## 2019-05-12 MED ORDER — DIVALPROEX SODIUM 125 MG PO DR TAB
125.0000 mg | DELAYED_RELEASE_TABLET | Freq: Two times a day (BID) | ORAL | Status: DC
  Administered 2019-05-12 – 2019-05-16 (×9): 125 mg via ORAL
  Filled 2019-05-12 (×11): qty 1

## 2019-05-12 MED ORDER — DEXTRAN 70-HYPROMELLOSE 0.1-0.3 % OP SOLN: OPHTHALMIC | Status: DC | PRN

## 2019-05-12 MED ORDER — MORPHINE SULFATE (PF) 2 MG/ML IV SOLN
2.0000 mg | INTRAVENOUS | Status: DC | PRN
  Administered 2019-05-12 – 2019-05-13 (×4): 2 mg via INTRAVENOUS
  Filled 2019-05-12 (×4): qty 1

## 2019-05-12 MED ORDER — LUBIPROSTONE 24 MCG PO CAPS
24.0000 ug | ORAL_CAPSULE | Freq: Every day | ORAL | Status: DC
  Administered 2019-05-13 – 2019-05-17 (×5): 24 ug via ORAL
  Filled 2019-05-12 (×5): qty 1

## 2019-05-12 MED ORDER — MORPHINE SULFATE (PF) 4 MG/ML IV SOLN
4.0000 mg | INTRAVENOUS | Status: DC | PRN
  Administered 2019-05-12: 4 mg via INTRAVENOUS
  Filled 2019-05-12: qty 1

## 2019-05-12 MED ORDER — TRAZODONE HCL 50 MG PO TABS
50.0000 mg | ORAL_TABLET | Freq: Every day | ORAL | Status: DC
  Administered 2019-05-12 – 2019-05-16 (×5): 50 mg via ORAL
  Filled 2019-05-12 (×5): qty 1

## 2019-05-12 MED ORDER — AMMONIUM LACTATE 12 % EX LOTN
1.0000 "application " | TOPICAL_LOTION | Freq: Two times a day (BID) | CUTANEOUS | Status: DC
  Administered 2019-05-14 – 2019-05-16 (×6): 1 via TOPICAL
  Filled 2019-05-12: qty 400

## 2019-05-12 MED ORDER — MORPHINE SULFATE (PF) 2 MG/ML IV SOLN: 0.5000 mg | INTRAVENOUS | Status: DC | PRN

## 2019-05-12 MED ORDER — SENNA 8.6 MG PO TABS
2.0000 | ORAL_TABLET | Freq: Two times a day (BID) | ORAL | Status: DC
  Administered 2019-05-12 – 2019-05-17 (×10): 17.2 mg via ORAL
  Filled 2019-05-12 (×10): qty 2

## 2019-05-12 MED ORDER — LACTATED RINGERS IV BOLUS
500.0000 mL | Freq: Once | INTRAVENOUS | Status: AC
  Administered 2019-05-12: 23:00:00 500 mL via INTRAVENOUS

## 2019-05-12 NOTE — Progress Notes (Signed)
Ch visited with Pt in response to OR for AD. Pt reported that Pt's husband should be able to come help in completing the AD. Ch will let chaplains coming in in the morning to follow-up on AD creation for Pt. Pt expressed that she has a bunch of medical problems, and she never thought that she will be in this place. Pt reported that she was in pain, and was grimacing throughout the entire visit. Pt requested prayer for pain relief and health to improve. Ch prayed with Pt. Pt was grateful for prayed.Ch informed Pt about chaplain availability. Pt asked if Ch could ask nurse for pain medication. Ch went and let RN Grover Canavan who said that she will take care of it.

## 2019-05-12 NOTE — Progress Notes (Signed)
PROGRESS NOTE    Patient: Melanie Berry                            PCP: Marden Noble, MD                    DOB: 02-04-1966            DOA: 05/11/2019 [id number]             DOS: 05/12/2019, 12:19 PM   LOS: 0 days   Date of Service: The patient was seen and examined on 05/12/2019  Subjective:   Patient was seen and examined this morning, awake alert, following commands, tearful depressed stating she was trying to recheck for remote where she fell. Stating she is not ambulating at baseline. Stating she is chronically ill, O2 dependent 2 L Denies any fever chills, shortness of breath or chest pain at this time.  Brief Narrative:   This is a 54 year old unfortunate female with multiple comorbidities, who is not ambulating at baseline due to progressive neurological deficit of Charcot arthropathy/neuropathy progressive, COPD O2 dependent, chronic lower extremity edema, venous stasis. Apparently sustained an accidental fall as she was reaching up for remote control at her nursing facility.  With the fall she sustained right femoral neck fracture.  Upon evaluation in ED patient was found septic likely due to cellulitis of her lower extremities  ?  Chronic records states that she might be active with hospice care at home   Assessment & Plan:   Principal Problem:   Fracture of femoral neck, right, closed ([place]) Active Problems:   CHARCOT-MARIE-TOOTH DISEASE   Venous ulcers of both lower extremities ([place])   Sepsis ([place])   Fall at home, initial encounter   Cellulitis of right leg   Obesity, Class III, BMI 40-49.9 (morbid obesity) ([place])   Closed right hip fracture (HCC)   COPD (chronic obstructive pulmonary disease) (HCC)     Fracture of femoral neck, right, closed ([place])   Fall at home, initial encounter -Orthopedics consulted >>> following accordingly -Pain control -We were unable to clear the patient for orthopedic intervention at this time due to sepsis    Sepsis  ([place])   Cellulitis of right leg   Venous stasis ulcer bilateral lower extremity -Patient met sepsis criteria with fever, tachycardia, leukocytosis and elevated lactic acid level -Suspect sepsis from infected venous stasis ulcers with cellulitis of the leg -Wound care consult -Blood cultures has been obtained >> -Patient has received Rocephin, will broaden antibiotics to cefepime and vancomycin -We will continue IV hydration per sepsis protocol     CHARCOT-MARIE-TOOTH DISEASE   Obesity, Class III, BMI 40-49.9 (morbid obesity) ([place]) -Patient with ambulatory dysfunction related to obesity and Charcot-Marie-Tooth disease -This complicates overall prognosis and care -Per record looks like she might be under hospice care  Pain syndrome -Trend patient closely -At home she is on Mobic, MS Contin, p.o. morphine -Weight we will evaluate medication and reinitiate accordingly     COPD (chronic obstructive pulmonary disease) ([place]) -Continue home meds   Depression /  anxiety -Patient seems to be on Abilify, trazodone and Effexor  -Along with Klonopin, Depakote Medication will be reviewed closely, likely will be modified before resuming   Multiple subcomorbidities, including aggressively declining, not ambulating, bedbound, Consult of care team Verify if patient is on the hospice care at nursing home.      Nutritional status:  Cultures; 05/12/2019 blood cultures x2 >>   Antimicrobials:  05/12/2019 cefepime/vancomycin >>  DVT prophylaxis: Lovenox SQ Code Status:   Code Status: Full Code Family Communication: No family member present at bedside- attempt will be made to update daily The above findings and plan of care has been discussed with patient and family in detail,  they expressed understanding and agreement of above. Disposition Plan:  >3 days Admission status:  NPATIEN -due to sepsis, right femoral neck fracture, needing IV antibiotics, surgical  intervention  Consultants: Ortho   Procedures:   No admission procedures for hospital encounter.    Antimicrobials:  Anti-infectives (From admission, onward)   Start     Dose/Rate Route Frequency Ordered Stop   05/12/19 0600  cefTRIAXone (ROCEPHIN) 2 g in sodium chloride 0.9 % 100 mL IVPB     2 g 200 mL/hr over 30 Minutes Intravenous Every 24 hours 05/12/19 0055     05/11/19 2130  ceFEPIme (MAXIPIME) 2 g in sodium chloride 0.9 % 100 mL IVPB     2 g 200 mL/hr over 30 Minutes Intravenous  Once 05/11/19 2115 05/11/19 2305   05/11/19 2130  metroNIDAZOLE (FLAGYL) IVPB 500 mg     500 mg 100 mL/hr over 60 Minutes Intravenous  Once 05/11/19 2115 05/11/19 2231   05/11/19 2130  vancomycin (VANCOCIN) IVPB 1000 mg/200 mL premix  Status:  Discontinued     1,000 mg 200 mL/hr over 60 Minutes Intravenous  Once 05/11/19 2115 05/11/19 2125   05/11/19 2130  vancomycin (VANCOREADY) IVPB 2000 mg/400 mL     2,000 mg 200 mL/hr over 120 Minutes Intravenous  Once 05/11/19 2125 05/12/19 0220       Medication:  . acidophilus  2 capsule Oral TID  . enoxaparin (LOVENOX) injection  40 mg Subcutaneous Q12H    HYDROcodone-acetaminophen, morphine injection   Objective:   Vitals:   05/12/19 0800 05/12/19 0830 05/12/19 0900 05/12/19 0930  BP: 122/69 130/77 125/77 (!) 123/94  Pulse: (!) 129 (!) 128 (!) 129 (!) 133  Resp: '18 19 19 20  '$ Temp:      TempSrc:      SpO2: 100% 100% 98% 98%  Weight:      Height:        Intake/Output Summary (Last 24 hours) at 05/12/2019 1219 Last data filed at 05/12/2019 1941 Gross per 24 hour  Intake 700 ml  Output --  Net 700 ml   Filed Weights   05/11/19 1948  Weight: 113.4 kg     Examination:   Physical Exam  Constitution:  Alert, cooperative, on O2 via nasal cannula, stating she not feeling well  Psychiatric: Tearful, but communicating, stating she is depressed HEENT: Normocephalic, PERRL, otherwise with in Normal limits  Chest:Chest symmetric Cardio  vascular:  S1/S2, RRR, No murmure, No Rubs or Gallops  pulmonary: Clear to auscultation bilaterally, respirations unlabored, negative wheezes / crackles Abdomen: Soft, non-tender, non-distended, bowel sounds,no masses, no organomegaly Muscular skeletal: Limited exam - in bed, able to move all 4 extremities, Normal strength,  Neuro: CNII-XII intact. ,  Sensory and motor diminished in lower extremities  Extremities: +3 pitting edema bilaterally all the way to the thighs lower extremities, +2 pulses,  Unable to move lower extremities Skin: Dry, warm to touch, negative for any Rashes, open ulcerated lower extremity wounds Wounds: Chronic lower extremity wounds, facial wounds draining dressing in place bilaterally +3 pitting edema lower extremities   LABs:  CBC Latest Ref Rng & Units 05/12/2019 05/11/2019 12/10/2015  WBC 4.0 - 10.5 K/uL 11.9(H) 12.4(H) 8.0  Hemoglobin 12.0 - 15.0 g/dL 9.7(L) 11.4(L) 11.8(L)  Hematocrit 36.0 - 46.0 % 31.6(L) 37.9 35.2  Platelets 150 - 400 K/uL 350 411(H) 412   CMP Latest Ref Rng & Units 05/12/2019 05/11/2019 12/10/2015  Glucose 70 - 99 mg/dL 118(H) 118(H) 94  BUN 6 - 20 mg/dL '13 13 8  '$ Creatinine 0.44 - 1.00 mg/dL 0.50 0.54 0.41(L)  Sodium 135 - 145 mmol/L 140 140 138  Potassium 3.5 - 5.1 mmol/L 3.9 4.2 3.7  Chloride 98 - 111 mmol/L 99 98 101  CO2 22 - 32 mmol/L 32 31 30  Calcium 8.9 - 10.3 mg/dL 8.3(L) 9.0 8.5(L)  Total Protein 6.5 - 8.1 g/dL - 7.0 6.0(L)  Total Bilirubin 0.3 - 1.2 mg/dL - 0.7 0.7  Alkaline Phos 38 - 126 U/L - 86 65  AST 15 - 41 U/L - 33 15  ALT 0 - 44 U/L - 19 9(L)        SIGNED: Deatra James, MD, FACP, FHM. [facility],  Pager [phone]  If 7PM-7AM, please contact night-coverage [email or web address] O[facility] 05/12/2019, 12:19 PM

## 2019-05-12 NOTE — Progress Notes (Signed)
CODE SEPSIS - PHARMACY COMMUNICATION  **Broad Spectrum Antibiotics should be administered within 1 hour of Sepsis diagnosis**  Time Code Sepsis Called/Page Received: 2117  Antibiotics Ordered: vanc/cefepime/flagyl  Time of 1st antibiotic administration: 2229  Additional action taken by pharmacy:   If necessary, Name of Provider/Nurse Contacted:     Thomasene Ripple ,PharmD Clinical Pharmacist  05/12/2019  2:48 AM

## 2019-05-12 NOTE — Progress Notes (Addendum)
ED visit made. Patient is currently followed by [employer] hospice services at [facility] with a hospice diagnosis of COPD. This is a related admission per hospice physician Dr. Merrie Roof. She is a DNR code with out of facility DNR in place in her shadow chart. Transfer summary and current medication list also placed in shadow chart. Patient was sent to the [facility] ED overnight via EMS following a fall out of bed. Xray revealed a right femoral neck fracture. Orthopedic consult completed, no surgery is planned at this time. Patient seen lying on the ED stretcher, alert and oriented, husband Fayrene Fearing at bedside. He reports that the attending physician had been in, but was unclear as to the treatment plan. Emotional support provided. Writer spoke at length to attending physician Dr. Flossie Dibble. Patient is currently receiving broad spectum IV antibiotics for suspected sepsis with unclear origin, possibly from cellulitis in her lowe extremities.  Also discussed facility administered pain medications and antidepressants. Plan aat this time is to continue antibiotics to treat the infection, no surgical intervention planned at this time. Patient is bed bound at baseline, if surgery were to be performed it would be for palliative/pain control. Will continue to follow and update hospice team. Thank you,. Dayna Barker BSN ,RN, [facility] [phone]

## 2019-05-12 NOTE — Consult Note (Signed)
Reason for Consult: Right femoral neck fracture displaced Referring Physician: Dr. Ocie Cornfield is an 54 y.o. female.  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  She suffered a fall and is difficult to understand as she is drowsy but it sounds like she was trying to shift and fell.  She sustained a displaced femoral neck fracture and I am consulted to discuss this with her.  Past Medical History:  Diagnosis Date  . Anemia   . Charcot's arthropathy   . COPD (chronic obstructive pulmonary disease) (HCC)   . COPD (chronic obstructive pulmonary disease) (HCC)   . Fluid overload   . GERD (gastroesophageal reflux disease)   . MDD (major depressive disorder)     Past Surgical History:  Procedure Laterality Date  . APPENDECTOMY    . CHOLECYSTECTOMY      Family History  Family history unknown: Yes    Social History:  reports that she has quit smoking. She has never used smokeless tobacco. She reports that she does not drink alcohol or use drugs.  Allergies:  Allergies  Allergen Reactions  . Pregabalin Swelling  . Shellfish-Derived Products Swelling  . Aspirin Other (See Comments)    REACTION: Unknown reaction  . Clarithromycin Other (See Comments)    REACTION: Unkown reaction  . Codeine Other (See Comments)    Unknown reaction  . Contrast Media [Iodinated Diagnostic Agents] Other (See Comments)    unknown  . Fentanyl Other (See Comments)    REACTION: Unknown reaction  . Gabapentin Other (See Comments)    REACTION: Unknown reaction  . Methocarbamol Other (See Comments)    REACTION: Unknown reaction  . Nicotine Other (See Comments)    REACTION: Allergic to patch only Unknown reaction  . Nortriptyline Hcl Other (See Comments)    REACTION: Unknown reaction  . Nortriptyline Hcl Other (See Comments)    Other reaction(s): Unknown REACTION: Unknown  reaction  . Potassium Iodide Other (See Comments)    REACTION: Unknown reaction  . Propoxyphene N-Acetaminophen Other (See Comments)    REACTION: Unknown reaction  . Sulfonamide Derivatives Other (See Comments)    REACTION: Unknown reaction  . Tramadol Hcl Other (See Comments)    REACTION: Severe headaches    Medications: I have reviewed the patient's current medications.  Results for orders placed or performed during the hospital encounter of 05/11/19 (from the past 48 hour(s))  Urinalysis, Complete w Microscopic     Status: Abnormal   Collection Time: 05/11/19  8:45 PM  Result Value Ref Range   Color, Urine YELLOW (A) YELLOW   APPearance HAZY (A) CLEAR   Specific Gravity, Urine 1.011 1.005 - 1.030   pH 6.0 5.0 - 8.0   Glucose, UA NEGATIVE NEGATIVE mg/dL   Hgb urine dipstick SMALL (A) NEGATIVE   Bilirubin Urine NEGATIVE NEGATIVE   Ketones, ur NEGATIVE NEGATIVE mg/dL   Protein, ur NEGATIVE NEGATIVE mg/dL   Nitrite NEGATIVE NEGATIVE   Leukocytes,Ua NEGATIVE NEGATIVE   RBC / HPF 0-5 0 - 5 RBC/hpf   WBC, UA 0-5 0 - 5 WBC/hpf   Bacteria, UA NONE SEEN NONE SEEN   Squamous Epithelial / LPF 0-5 0 - 5   Mucus PRESENT    Hyaline Casts, UA PRESENT     Comment: Performed at [facility], [address], [place], [place] [address]  CBC with Differential/Platelet     Status:  Abnormal   Collection Time: 05/11/19  9:17 PM  Result Value Ref Range   WBC 12.4 (H) 4.0 - 10.5 K/uL   RBC 4.25 3.87 - 5.11 MIL/uL   Hemoglobin 11.4 (L) 12.0 - 15.0 g/dL   HCT 37.9 36.0 - 46.0 %   MCV 89.2 80.0 - 100.0 fL   MCH 26.8 26.0 - 34.0 pg   MCHC 30.1 30.0 - 36.0 g/dL   RDW 17.1 (H) 11.5 - 15.5 %   Platelets 411 (H) 150 - 400 K/uL   nRBC 0.0 0.0 - 0.2 %   Neutrophils Relative % 77 %   Neutro Abs 9.6 (H) 1.7 - 7.7 K/uL   Lymphocytes Relative 15 %   Lymphs Abs 1.9 0.7 - 4.0 K/uL   Monocytes Relative 5 %   Monocytes Absolute 0.6 0.1 - 1.0 K/uL   Eosinophils Relative 2 %   Eosinophils  Absolute 0.3 0.0 - 0.5 K/uL   Basophils Relative 1 %   Basophils Absolute 0.1 0.0 - 0.1 K/uL   Immature Granulocytes 0 %   Abs Immature Granulocytes 0.03 0.00 - 0.07 K/uL    Comment: Performed at [facility], [address] [place], [place] [address]  Comprehensive metabolic panel     Status: Abnormal   Collection Time: 05/11/19  9:17 PM  Result Value Ref Range   Sodium 140 135 - 145 mmol/L   Potassium 4.2 3.5 - 5.1 mmol/L   Chloride 98 98 - 111 mmol/L   CO2 31 22 - 32 mmol/L   Glucose, Bld 118 (H) 70 - 99 mg/dL    Comment: Glucose reference range applies only to samples taken after fasting for at least 8 hours.   BUN 13 6 - 20 mg/dL   Creatinine, Ser 0.54 0.44 - 1.00 mg/dL   Calcium 9.0 8.9 - 10.3 mg/dL   Total Protein 7.0 6.5 - 8.1 g/dL   Albumin 3.4 (L) 3.5 - 5.0 g/dL   AST 33 15 - 41 U/L   ALT 19 0 - 44 U/L   Alkaline Phosphatase 86 38 - 126 U/L   Total Bilirubin 0.7 0.3 - 1.2 mg/dL   GFR calc non Af Amer >60 >60 mL/min   GFR calc Af Amer >60 >60 mL/min   Anion gap 11 5 - 15    Comment: Performed at [facility], [address] [place], [place] [address]  Lactic acid, plasma     Status: Abnormal   Collection Time: 05/11/19  9:17 PM  Result Value Ref Range   Lactic Acid, Venous 2.7 (HH) 0.5 - 1.9 mmol/L    Comment: CRITICAL RESULT CALLED TO, READ BACK BY AND VERIFIED WITH MITCH BARKER @[id number]  05/11/19 MJU Performed at [facility], [address] [place], [place] [address]   Procalcitonin     Status: None   Collection Time: 05/11/19  9:17 PM  Result Value Ref Range   Procalcitonin <0.10 ng/mL    Comment:        Interpretation: PCT (Procalcitonin) <= 0.5 ng/mL: Systemic infection (sepsis) is not likely. Local bacterial infection is possible. (NOTE)       Sepsis PCT Algorithm           Lower Respiratory Tract                                      Infection PCT Algorithm    ----------------------------     ----------------------------  PCT < 0.25 ng/mL                PCT < 0.10 ng/mL         Strongly encourage             Strongly discourage   discontinuation of antibiotics    initiation of antibiotics    ----------------------------     -----------------------------       PCT 0.25 - 0.50 ng/mL            PCT 0.10 - 0.25 ng/mL               OR       >80% decrease in PCT            Discourage initiation of                                            antibiotics      Encourage discontinuation           of antibiotics    ----------------------------     -----------------------------         PCT >= 0.50 ng/mL              PCT 0.26 - 0.50 ng/mL               AND        <80% decrease in PCT             Encourage initiation of                                             antibiotics       Encourage continuation           of antibiotics    ----------------------------     -----------------------------        PCT >= 0.50 ng/mL                  PCT > 0.50 ng/mL               AND         increase in PCT                  Strongly encourage                                      initiation of antibiotics    Strongly encourage escalation           of antibiotics                                     -----------------------------                                           PCT <= 0.25 ng/mL  OR                                        > 80% decrease in PCT                                     Discontinue / Do not initiate                                             antibiotics Performed at [facility], [address] [place], [place] [address]   Protime-INR     Status: None   Collection Time: 05/11/19  9:17 PM  Result Value Ref Range   Prothrombin Time 13.3 11.4 - 15.2 seconds   INR 1.0 0.8 - 1.2    Comment: (NOTE) INR goal varies based on device and disease states. Performed at [facility], [address] [place], [place] [address]   Lactic acid, plasma      Status: Abnormal   Collection Time: 05/12/19  1:03 AM  Result Value Ref Range   Lactic Acid, Venous 2.1 (HH) 0.5 - 1.9 mmol/L    Comment: CRITICAL VALUE NOTED. VALUE IS CONSISTENT WITH PREVIOUSLY REPORTED/CALLED VALUE RWW Performed at [facility], [address] [place], [place] [address]   CBC     Status: Abnormal   Collection Time: 05/12/19  1:03 AM  Result Value Ref Range   WBC 11.9 (H) 4.0 - 10.5 K/uL   RBC 3.58 (L) 3.87 - 5.11 MIL/uL   Hemoglobin 9.7 (L) 12.0 - 15.0 g/dL   HCT 78.2 (L) 42.3 - 53.6 %   MCV 88.3 80.0 - 100.0 fL   MCH 27.1 26.0 - 34.0 pg   MCHC 30.7 30.0 - 36.0 g/dL   RDW 14.4 (H) 31.5 - 40.0 %   Platelets 350 150 - 400 K/uL   nRBC 0.0 0.0 - 0.2 %    Comment: Performed at [facility], [address] [place], [place] [address]  Basic metabolic panel     Status: Abnormal   Collection Time: 05/12/19  1:03 AM  Result Value Ref Range   Sodium 140 135 - 145 mmol/L   Potassium 3.9 3.5 - 5.1 mmol/L   Chloride 99 98 - 111 mmol/L   CO2 32 22 - 32 mmol/L   Glucose, Bld 118 (H) 70 - 99 mg/dL    Comment: Glucose reference range applies only to samples taken after fasting for at least 8 hours.   BUN 13 6 - 20 mg/dL   Creatinine, Ser 9.50 0.44 - 1.00 mg/dL   Calcium 8.3 (L) 8.9 - 10.3 mg/dL   GFR calc non Af Amer >60 >60 mL/min   GFR calc Af Amer >60 >60 mL/min   Anion gap 9 5 - 15    Comment: Performed at [facility], [address] [place], [place] [address]    DG Chest 1 View  Result Date: 05/11/2019 CLINICAL DATA:  Right hip and leg pain EXAM: CHEST  1 VIEW COMPARISON:  11/15/2015 FINDINGS: Chronic elevation of the right hemidiaphragm. Low lung volumes. No confluent opacities. Heart is normal size. No effusions. No acute bony abnormality. IMPRESSION: Chronic elevation of  the right hemidiaphragm. Low lung volumes. No active disease. Electronically Signed   By: Charlett Nose M.D.   On: 05/11/2019 20:26   DG Pelvis 1-2  Views  Result Date: 05/11/2019 CLINICAL DATA:  Unwitnessed fall EXAM: PELVIS - 1-2 VIEW COMPARISON:  06/28/2009 FINDINGS: There is a right femoral neck fracture with varus angulation and foreshortening. No subluxation or dislocation. Degenerative changes in the hips. IMPRESSION: Right femoral neck fracture with varus angulation. Electronically Signed   By: Charlett Nose M.D.   On: 05/11/2019 20:27   CT Head Wo Contrast  Result Date: 05/11/2019 CLINICAL DATA:  54 year old female with trauma. EXAM: CT HEAD WITHOUT CONTRAST TECHNIQUE: Contiguous axial images were obtained from the base of the skull through the vertex without intravenous contrast. COMPARISON:  Head CT dated 03/11/2013. FINDINGS: Brain: Mild age-related atrophy and chronic microvascular ischemic changes. There is no acute intracranial hemorrhage. No mass effect or midline shift. No extra-axial fluid collection. Vascular: No hyperdense vessel or unexpected calcification. Skull: Normal. Negative for fracture or focal lesion. Sinuses/Orbits: Mild diffuse mucoperiosteal thickening of paranasal sinuses and postsurgical changes of the maxillary sinus consistent with chronic sinusitis. No air-fluid level. Partial opacification of the right mastoid air cells. The left mastoid air cells are clear. Cerumen noted in the left external auditory canal. Other: None IMPRESSION: 1. No acute intracranial pathology. 2. Mild age-related atrophy and chronic microvascular ischemic changes. 3. Chronic paranasal sinus disease. Electronically Signed   By: Elgie Collard M.D.   On: 05/11/2019 20:08   DG Femur Min 2 Views Right  Result Date: 05/11/2019 CLINICAL DATA:  54 year old female with fall and right hip pain. Evaluate for fracture. EXAM: RIGHT FEMUR 2 VIEWS COMPARISON:  CT abdomen pelvis dated 12/10/2015. FINDINGS: There is a mildly displaced fracture of the right femoral neck. There is an age indeterminate depressed fracture of the lateral tibial plateau,  possibly subacute or acute. Dedicated radiograph of the knee is recommended for better evaluation. Evaluation for fracture is very limited due to body habitus and soft tissue attenuation and advanced osteopenia. No dislocation. There is a moderate-sized suprapatellar effusion. There is mild diffuse subcutaneous edema. IMPRESSION: 1. Minimally displaced fracture of the right femoral neck. 2. Age indeterminate, possibly acute or subacute depressed fracture of the lateral tibial plateau. Dedicated radiograph of the knee is recommended for better evaluation. Electronically Signed   By: Elgie Collard M.D.   On: 05/11/2019 20:26    Review of Systems Blood pressure 125/75, pulse (!) 131, temperature 99.3 F (37.4 C), temperature source Oral, resp. rate 20, height 5\' 4"  (1.626 m), weight 113.4 kg, SpO2 100 %. Physical Exam the right leg is shortened and externally rotated with nonpalpable pulses and 4+ edema.  There is evidence of chronic venous stasis in the lower legs bilaterally.  She is able flex extend her toes slightly.  Skin is intact around the hip.  No ecchymosis. Radiographs reveal a displaced femoral neck fracture without underlying osteoarthritis, extensive osteopenia.  Assessment/Plan: Displaced femoral neck fracture in a nonambulator suffering from significant pain. We discussed treatment options.  #1 would be to do nothing and treat this nonoperatively and just manage with pain medication.  The advantages are the lack of risk of deep infection related to a prosthesis intraoperative fracture bleeding as well as dislocation.  Unfortunately this also tends to lead to more prolonged pain. #2 is to do a [place] where we remove the femoral head and not put an implant.  She would have some increased pain  compared to the prosthesis but much less risk of infection. #3 is to do standard approach with a displaced femoral neck fracture to be a hemiarthroplasty.  This would likely give the most rapid  relief of pain but has highest risk related to deep infection and complications related to surgery anesthesia and blood loss as well as subsequent deep infection with her implant or potential dislocation although that is less likely through the anterior approach. I discussed that with her this morning and will come back again later today and hopefully her husband will be here where we can review these options. With her sepsis she cannot have surgery today in any case.  Kennedy Bucker 05/12/2019, 7:01 AM

## 2019-05-12 NOTE — Progress Notes (Signed)
Ch attempted visit in response to OR for AD creation. Rns working on Pt at this time. Will attempt visit again later.

## 2019-05-12 NOTE — Progress Notes (Signed)
D: Pt alert and oriented x 4. Pt reports experiencing pain, PRN meds given. Pt is from [facility] after a fall. Pt skin assessment completed. Pt has MASD, multiple skin tears, cellulitis, and ulcers. Wound consult has been placed. Consult for healthcare directives have also been placed per pt request. Pt needs help with ordering food and with being feed. MD also contacted in r/t elevated pulse, MD orders to continue to monitor.  A: Scheduled medications administered to pt, per MD orders. Support and encouragement provided. Frequent verbal contact made.    R: No adverse drug reactions noted. Pt complaint with medications and treatment plan. Pt interacts well with staff on the unit. Will continue to monitor and provide care for as ordered.

## 2019-05-12 NOTE — ED Notes (Signed)
Dr. Rosita Kea in room to evaluate patient

## 2019-05-12 NOTE — ED Notes (Signed)
NP sent secure chat to clarify morphine orders

## 2019-05-12 NOTE — H&P (Signed)
History and Physical    Melanie Berry:[id number] DOB: [birth year] DOA: 05/11/2019  PCP: Marden Noble, MD   Patient coming from: snf I have personally briefly reviewed patient's old medical records in [place]  Chief Complaint: fall  HPI: Melanie Berry is a 54 y.o. female with medical history significant for morbid obesity, COPD, chronic lower extremity venous stasis with venous stasis ulcers, Charcot's arthropathy  sent from her facility after sustaining a witnessed fall.  She was unable to get up due to severe right leg pain.  History limited as patient is very drowsy, possibly from pain med administered in the emergency room  ED Course: She was febrile at 100.6 and tachycardic at 128.  See 12,000, lactic acid 2.7.  Urinalysis unremarkable, procalcitonin less than 0.10, chest x-ray active disease.  She was noted to have wounds on her legs and redness concerning for cellulitis.  X-ray of the hip showed femoral neck fracture on the right.  The emergency room provider spoke with Dr. Rudene Christians.  Patient met sepsis criteria and was started on antibiotics and fluid for sepsis from cellulitis.  Hospitalist consulted for admission.  Review of Systems: Unable to obtain due to drowsiness  Past Medical History:  Diagnosis Date  . Anemia   . Charcot's arthropathy   . COPD (chronic obstructive pulmonary disease) ([place])   . COPD (chronic obstructive pulmonary disease) ([place])   . Fluid overload   . GERD (gastroesophageal reflux disease)   . MDD (major depressive disorder)     Past Surgical History:  Procedure Laterality Date  . APPENDECTOMY    . CHOLECYSTECTOMY       reports that she has quit smoking. She has never used smokeless tobacco. She reports that she does not drink alcohol or use drugs.  Allergies  Allergen Reactions  . Pregabalin Swelling  . Shellfish-Derived Products Swelling  . Aspirin Other (See Comments)    REACTION: Unknown reaction  . Clarithromycin Other (See  Comments)    REACTION: Unkown reaction  . Codeine Other (See Comments)    Unknown reaction  . Contrast Media [Iodinated Diagnostic Agents] Other (See Comments)    unknown  . Fentanyl Other (See Comments)    REACTION: Unknown reaction  . Gabapentin Other (See Comments)    REACTION: Unknown reaction  . Methocarbamol Other (See Comments)    REACTION: Unknown reaction  . Nicotine Other (See Comments)    REACTION: Allergic to patch only Unknown reaction  . Nortriptyline Hcl Other (See Comments)    REACTION: Unknown reaction  . Nortriptyline Hcl Other (See Comments)    Other reaction(s): Unknown REACTION: Unknown reaction  . Potassium Iodide Other (See Comments)    REACTION: Unknown reaction  . Propoxyphene N-Acetaminophen Other (See Comments)    REACTION: Unknown reaction  . Sulfonamide Derivatives Other (See Comments)    REACTION: Unknown reaction  . Tramadol Hcl Other (See Comments)    REACTION: Severe headaches    Family History  Family history unknown: Yes     Prior to Admission medications   Medication Sig Start Date End Date Taking? Authorizing Provider  ARIPiprazole (ABILIFY) 2 MG tablet Take 12 mg by mouth daily.    Yes [provider]  lubiprostone (AMITIZA) 24 MCG capsule Take 24 mcg by mouth daily with breakfast.   Yes [provider]  acetaminophen (TYLENOL) 325 MG tablet Take 650 mg by mouth every 6 (six) hours as needed for moderate pain.    [provider]  azelastine (ASTELIN) 0.1 % nasal spray Place 2 sprays into both nostrils 2 (two) times daily. Use in each nostril as directed    [provider]  Cholecalciferol (VITAMIN D3) 50000 units CAPS Take 50,000 Units by mouth every 30 (thirty) days.    [provider]  ciclopirox (LOPROX) 0.77 % cream Apply topically 2 (two) times daily. Apply bilateral toenails topically every day shift for toenail fungus    [provider]  clonazePAM (KLONOPIN) 0.5 MG tablet  Take 0.5 mg by mouth 2 (two) times daily as needed for anxiety.    [provider]  fluticasone (FLONASE) 50 MCG/ACT nasal spray Place 2 sprays into both nostrils daily.    [provider]  HYDROcodone-acetaminophen (NORCO) 10-325 MG tablet Take 1 tablet by mouth every 6 (six) hours as needed.    [provider]  ipratropium-albuterol (DUONEB) 0.5-2.5 (3) MG/3ML SOLN Take 3 mLs by nebulization every 6 (six) hours as needed.    [provider]  Melatonin 3 MG TABS Take by mouth.    [provider]  meloxicam (MOBIC) 7.5 MG tablet Take 1 tablet (7.5 mg total) by mouth daily. 11/17/15   Vaughan Basta, MD  Menthol, Topical Analgesic, (BIOFREEZE EX) Apply 1 application topically every 8 (eight) hours as needed. Apply to left shoulder for pain    [provider]  menthol-cetylpyridinium (CEPACOL) 3 MG lozenge Take 1 lozenge by mouth every hour as needed for sore throat.    [provider]  montelukast (SINGULAIR) 10 MG tablet Take 10 mg by mouth at bedtime.    [provider]  nystatin (MYCOSTATIN) 100000 UNIT/ML suspension Take 5 mLs by mouth 4 (four) times daily.    [provider]  omeprazole (PRILOSEC) 20 MG capsule Take 20 mg by mouth daily.    [provider]  potassium chloride SA (K-DUR,KLOR-CON) 20 MEQ tablet Take 40 mEq by mouth daily.    [provider]  senna (SENOKOT) 8.6 MG tablet Take 8.6 mg by mouth 2 (two) times daily.    [provider]  traZODone (DESYREL) 100 MG tablet Take 100 mg by mouth at bedtime.    [provider]  venlafaxine XR (EFFEXOR-XR) 150 MG 24 hr capsule Take 150 mg by mouth daily with breakfast.    [provider]    Physical Exam: Vitals:   05/11/19 2220 05/11/19 2300 05/12/19 0000 05/12/19 0030  BP: 109/80 114/82 110/69 109/73  Pulse: (!) 128 (!) 132 (!) 126 (!) 130  Resp: '16 15 13 15  '$ Temp:      TempSrc:      SpO2: 97% 100% 97%  97%  Weight:      Height:         Vitals:   05/11/19 2220 05/11/19 2300 05/12/19 0000 05/12/19 0030  BP: 109/80 114/82 110/69 109/73  Pulse: (!) 128 (!) 132 (!) 126 (!) 130  Resp: '16 15 13 15  '$ Temp:      TempSrc:      SpO2: 97% 100% 97% 97%  Weight:      Height:        Constitutional: Somnolent but will awaken and answer questions and oriented x3.  Not in acute distress.   Eyes: PERLA, EOMI, irises appear normal, anicteric sclera,  ENMT: external ears and nose appear normal, normal hearing             Lips appears normal, oropharynx mucosa, tongue, posterior pharynx appear normal  Neck: neck appears  normal, no masses, normal ROM, no thyromegaly, no JVD  CVS: S1-S2 clear, no murmur rubs or gallops,  , no carotid bruits, pedal pulses palpable, 4+ pitting edema in both lower extremities and also in upper extremities Respiratory:  clear to auscultation bilaterally, no wheezing, rales or rhonchi. Respiratory effort normal. No accessory muscle use.  Abdomen: soft nontender, nondistended, normal bowel sounds, no hepatosplenomegaly, no hernias Musculoskeletal: : no cyanosis, clubbing , deformity bilateral feet as well as right hand with swelling in legs and hands Neuro: Grossly intact without focal deficit Psych: Unable to assess due to lethargy Skin: Wounds with dressings on right and left anterior shins.  Redness from ankle halfway up lower leg on the right and similarly on the left.  Labs on Admission: I have personally reviewed following labs and imaging studies  CBC: Recent Labs  Lab 05/11/19 2117 05/12/19 0103  WBC 12.4* 11.9*  NEUTROABS 9.6*  --   HGB 11.4* 9.7*  HCT 37.9 31.6*  MCV 89.2 88.3  PLT 411* 903   Basic Metabolic Panel: Recent Labs  Lab 05/11/19 2117  NA 140  K 4.2  CL 98  CO2 31  GLUCOSE 118*  BUN 13  CREATININE 0.54  CALCIUM 9.0   GFR: Estimated Creatinine Clearance: 98.1 mL/min (by C-G formula based on SCr of 0.54 mg/dL). Liver Function  Tests: Recent Labs  Lab 05/11/19 2117  AST 33  ALT 19  ALKPHOS 86  BILITOT 0.7  PROT 7.0  ALBUMIN 3.4*   No results for input(s): LIPASE, AMYLASE in the last 168 hours. No results for input(s): AMMONIA in the last 168 hours. Coagulation Profile: Recent Labs  Lab 05/11/19 2117  INR 1.0   Cardiac Enzymes: No results for input(s): CKTOTAL, CKMB, CKMBINDEX, TROPONINI in the last 168 hours. BNP (last 3 results) No results for input(s): PROBNP in the last 8760 hours. HbA1C: No results for input(s): HGBA1C in the last 72 hours. CBG: No results for input(s): GLUCAP in the last 168 hours. Lipid Profile: No results for input(s): CHOL, HDL, LDLCALC, TRIG, CHOLHDL, LDLDIRECT in the last 72 hours. Thyroid Function Tests: No results for input(s): TSH, T4TOTAL, FREET4, T3FREE, THYROIDAB in the last 72 hours. Anemia Panel: No results for input(s): VITAMINB12, FOLATE, FERRITIN, TIBC, IRON, RETICCTPCT in the last 72 hours. Urine analysis:    Component Value Date/Time   COLORURINE YELLOW (A) 05/11/2019 2045   APPEARANCEUR HAZY (A) 05/11/2019 2045   APPEARANCEUR Cloudy 10/25/2013 0810   LABSPEC 1.011 05/11/2019 2045   LABSPEC 1.005 10/25/2013 0810   PHURINE 6.0 05/11/2019 2045   GLUCOSEU NEGATIVE 05/11/2019 2045   GLUCOSEU Negative 10/25/2013 0810   GLUCOSEU NEG mg/dL 09/25/2006 2208   HGBUR SMALL (A) 05/11/2019 2045   BILIRUBINUR NEGATIVE 05/11/2019 2045   BILIRUBINUR Negative 10/25/2013 0810   KETONESUR NEGATIVE 05/11/2019 2045   PROTEINUR NEGATIVE 05/11/2019 2045   UROBILINOGEN 0.2 09/25/2006 2208   NITRITE NEGATIVE 05/11/2019 2045   LEUKOCYTESUR NEGATIVE 05/11/2019 2045   LEUKOCYTESUR 2+ 10/25/2013 0810    Radiological Exams on Admission: DG Chest 1 View  Result Date: 05/11/2019 CLINICAL DATA:  Right hip and leg pain EXAM: CHEST  1 VIEW COMPARISON:  11/15/2015 FINDINGS: Chronic elevation of the right hemidiaphragm. Low lung volumes. No confluent opacities. Heart is normal  size. No effusions. No acute bony abnormality. IMPRESSION: Chronic elevation of the right hemidiaphragm. Low lung volumes. No active disease. Electronically Signed   By: Rolm Baptise M.D.   On: 05/11/2019 20:26   DG Pelvis  1-2 Views  Result Date: 05/11/2019 CLINICAL DATA:  Unwitnessed fall EXAM: PELVIS - 1-2 VIEW COMPARISON:  06/28/2009 FINDINGS: There is a right femoral neck fracture with varus angulation and foreshortening. No subluxation or dislocation. Degenerative changes in the hips. IMPRESSION: Right femoral neck fracture with varus angulation. Electronically Signed   By: Rolm Baptise M.D.   On: 05/11/2019 20:27   CT Head Wo Contrast  Result Date: 05/11/2019 CLINICAL DATA:  54 year old female with trauma. EXAM: CT HEAD WITHOUT CONTRAST TECHNIQUE: Contiguous axial images were obtained from the base of the skull through the vertex without intravenous contrast. COMPARISON:  Head CT dated 03/11/2013. FINDINGS: Brain: Mild age-related atrophy and chronic microvascular ischemic changes. There is no acute intracranial hemorrhage. No mass effect or midline shift. No extra-axial fluid collection. Vascular: No hyperdense vessel or unexpected calcification. Skull: Normal. Negative for fracture or focal lesion. Sinuses/Orbits: Mild diffuse mucoperiosteal thickening of paranasal sinuses and postsurgical changes of the maxillary sinus consistent with chronic sinusitis. No air-fluid level. Partial opacification of the right mastoid air cells. The left mastoid air cells are clear. Cerumen noted in the left external auditory canal. Other: None IMPRESSION: 1. No acute intracranial pathology. 2. Mild age-related atrophy and chronic microvascular ischemic changes. 3. Chronic paranasal sinus disease. Electronically Signed   By: Anner Crete M.D.   On: 05/11/2019 20:08   DG Femur Min 2 Views Right  Result Date: 05/11/2019 CLINICAL DATA:  54 year old female with fall and right hip pain. Evaluate for fracture. EXAM:  RIGHT FEMUR 2 VIEWS COMPARISON:  CT abdomen pelvis dated 12/10/2015. FINDINGS: There is a mildly displaced fracture of the right femoral neck. There is an age indeterminate depressed fracture of the lateral tibial plateau, possibly subacute or acute. Dedicated radiograph of the knee is recommended for better evaluation. Evaluation for fracture is very limited due to body habitus and soft tissue attenuation and advanced osteopenia. No dislocation. There is a moderate-sized suprapatellar effusion. There is mild diffuse subcutaneous edema. IMPRESSION: 1. Minimally displaced fracture of the right femoral neck. 2. Age indeterminate, possibly acute or subacute depressed fracture of the lateral tibial plateau. Dedicated radiograph of the knee is recommended for better evaluation. Electronically Signed   By: Anner Crete M.D.   On: 05/11/2019 20:26    EKG: Independently reviewed.   Assessment/Plan    Fracture of femoral neck, right, closed ([place])   Fall at home, initial encounter -Orthopedics consulted -Pain control -Unable to clear patient for surgery later today due to sepsis diagnosis    Sepsis ([place])   Cellulitis of right leg   Venous stasis ulcer bilateral lower extremity -Patient met sepsis criteria with fever, tachycardia, leukocytosis and elevated lactic acid level -Suspect sepsis from infected venous stasis ulcers with cellulitis of the leg -Wound care consult -Continue Rocephin -IV hydration per sepsis protocol     CHARCOT-MARIE-TOOTH DISEASE   Obesity, Class III, BMI 40-49.9 (morbid obesity) ([place]) -Patient with ambulatory dysfunction related to obesity and Charcot-Marie-Tooth disease -This complicates overall prognosis and care    COPD (chronic obstructive pulmonary disease) ([place]) -Continue home meds    DVT prophylaxis: Lovenox  Code Status: dnr Family Communication:  none  Disposition Plan: Back to previous home environment Consults called: dr Rudene Christians  Status:inp    Athena Masse MD [facility]     05/12/2019, 1:29 AM

## 2019-05-13 ENCOUNTER — Inpatient Hospital Stay

## 2019-05-13 DIAGNOSIS — Z7189 Other specified counseling: Secondary | ICD-10-CM

## 2019-05-13 DIAGNOSIS — Z515 Encounter for palliative care: Secondary | ICD-10-CM

## 2019-05-13 DIAGNOSIS — S72001P Fracture of unspecified part of neck of right femur, subsequent encounter for closed fracture with malunion: Secondary | ICD-10-CM

## 2019-05-13 LAB — CBC
HCT: 28.7 % — ABNORMAL LOW (ref 36.0–46.0)
Hemoglobin: 8.7 g/dL — ABNORMAL LOW (ref 12.0–15.0)
MCH: 26.9 pg (ref 26.0–34.0)
MCHC: 30.3 g/dL (ref 30.0–36.0)
MCV: 88.6 fL (ref 80.0–100.0)
Platelets: 258 10*3/uL (ref 150–400)
RBC: 3.24 MIL/uL — ABNORMAL LOW (ref 3.87–5.11)
RDW: 16.9 % — ABNORMAL HIGH (ref 11.5–15.5)
WBC: 8.9 10*3/uL (ref 4.0–10.5)
nRBC: 0 % (ref 0.0–0.2)

## 2019-05-13 LAB — BASIC METABOLIC PANEL
Anion gap: 7 (ref 5–15)
BUN: 10 mg/dL (ref 6–20)
CO2: 30 mmol/L (ref 22–32)
Calcium: 8.1 mg/dL — ABNORMAL LOW (ref 8.9–10.3)
Chloride: 103 mmol/L (ref 98–111)
Creatinine, Ser: 0.45 mg/dL (ref 0.44–1.00)
GFR calc Af Amer: >60 mL/min (ref >60)
GFR calc non Af Amer: >60 mL/min (ref >60)
Glucose, Bld: 91 mg/dL (ref 70–99)
Potassium: 3.2 mmol/L — ABNORMAL LOW (ref 3.5–5.1)
Sodium: 140 mmol/L (ref 135–145)

## 2019-05-13 LAB — MRSA PCR SCREENING: MRSA by PCR: POSITIVE — AB

## 2019-05-13 LAB — BRAIN NATRIURETIC PEPTIDE: B Natriuretic Peptide: 43 pg/mL (ref 0.0–100.0)

## 2019-05-13 MED ORDER — MORPHINE SULFATE (PF) 2 MG/ML IV SOLN
2.0000 mg | INTRAVENOUS | Status: DC | PRN
  Administered 2019-05-13 – 2019-05-17 (×15): 2 mg via INTRAVENOUS
  Filled 2019-05-13 (×16): qty 1

## 2019-05-13 MED ORDER — FUROSEMIDE 10 MG/ML IJ SOLN
40.0000 mg | Freq: Once | INTRAMUSCULAR | Status: AC
  Administered 2019-05-13: 40 mg via INTRAVENOUS
  Filled 2019-05-13: qty 4

## 2019-05-13 MED ORDER — LACTATED RINGERS IV BOLUS
500.0000 mL | Freq: Once | INTRAVENOUS | Status: AC
  Administered 2019-05-13: 500 mL via INTRAVENOUS

## 2019-05-13 MED ORDER — CARVEDILOL 3.125 MG PO TABS
6.2500 mg | ORAL_TABLET | Freq: Two times a day (BID) | ORAL | Status: DC
  Administered 2019-05-13 – 2019-05-17 (×7): 6.25 mg via ORAL
  Filled 2019-05-13 (×10): qty 2

## 2019-05-13 MED ORDER — CHLORHEXIDINE GLUCONATE CLOTH 2 % EX PADS
6.0000 | MEDICATED_PAD | Freq: Every day | CUTANEOUS | Status: DC
  Administered 2019-05-14 – 2019-05-17 (×4): 6 via TOPICAL

## 2019-05-13 MED ORDER — GERHARDT'S BUTT CREAM
TOPICAL_CREAM | Freq: Two times a day (BID) | CUTANEOUS | Status: DC
  Filled 2019-05-13: qty 1

## 2019-05-13 MED ORDER — POTASSIUM CHLORIDE CRYS ER 20 MEQ PO TBCR
40.0000 meq | EXTENDED_RELEASE_TABLET | Freq: Once | ORAL | Status: AC
  Administered 2019-05-13: 14:00:00 40 meq via ORAL
  Filled 2019-05-13: qty 2

## 2019-05-13 MED ORDER — IPRATROPIUM-ALBUTEROL 0.5-2.5 (3) MG/3ML IN SOLN: 3.0000 mL | RESPIRATORY_TRACT | Status: DC | PRN

## 2019-05-13 MED ORDER — MUPIROCIN 2 % EX OINT
TOPICAL_OINTMENT | Freq: Two times a day (BID) | CUTANEOUS | Status: DC
  Filled 2019-05-13: qty 22

## 2019-05-13 MED ORDER — ENSURE ENLIVE PO LIQD
237.0000 mL | Freq: Two times a day (BID) | ORAL | Status: DC
  Administered 2019-05-14 – 2019-05-16 (×6): 237 mL via ORAL

## 2019-05-13 NOTE — Progress Notes (Signed)
Initial Nutrition Assessment  RD working remotely.  DOCUMENTATION CODES:   Morbid obesity  INTERVENTION:  Provide Ensure Enlive po BID, each supplement provides 350 kcal and 20 grams of protein.  NUTRITION DIAGNOSIS:   Increased nutrient needs related to catabolic illness(COPD) as evidenced by estimated needs.  GOAL:   Patient will meet greater than or equal to 90% of their needs  MONITOR:   Labs, PO intake, Supplement acceptance, Weight trends, Skin, I & O's  REASON FOR ASSESSMENT:   Consult Assessment of nutrition requirement/status  ASSESSMENT:   54 year old female with PMHx of COPD, MDD, anemia, Charcot-Marie-Tooth disease, GERD admitted after fall found to have fracture of right femoral neck, sepsis, venous stasis ulcer of bilateral lower extremity.   Attempted to call patient over the phone but patient was unable to answer. Noted she was followed by hospice at home and plan is to discharge with hospice once medically stable. No meal completion data available at this time.  Limited recent weight history in chart available to trend. Patient was 109.8 kg on 02/02/2018 and 108.4 kg on 04/30/2018. She is now documented to be 113.4 kg (250 lbs) but unsure if this is a true measured weight.  Medications reviewed and include: acidophilus 2 capsules TID, carvedilol, senna, ceftriaxone.  Labs reviewed: Potassium 3.2.   Unable to determine if patient meets criteria for malnutrition at this time.  NUTRITION - FOCUSED PHYSICAL EXAM:  Unable to complete at this time as RD working remotely.  Diet Order:   Diet Order            Diet regular Room service appropriate? Yes; Fluid consistency: Thin  Diet effective now             EDUCATION NEEDS:   No education needs have been identified at this time  Skin:  Skin Assessment: Skin Integrity Issues:(venous stasis ulcers with cellulitis to bilateral legs)  Last BM:  Unknown  Height:   Ht Readings from Last 1 Encounters:   05/11/19 5\' 4"  (1.626 m)   Weight:   Wt Readings from Last 1 Encounters:  05/11/19 113.4 kg   BMI:  Body mass index is 42.91 kg/m.  Estimated Nutritional Needs:   Kcal:  2000-2200  Protein:  110-120 grams  Fluid:  >/= 2 L/day  05/13/19, MS, RD, LDN Pager number available on Amion

## 2019-05-13 NOTE — Consult Note (Signed)
WOC Nurse Consult Note: Reason for Consult:Chronic venous insufficiency with cellulitis to lower legs.  Moisture associated skin damage to perineal skin from incontinence exposure and disposable brief use.  Wound type: infectious (legs) and MASD to perineal skin Pressure Injury POA: NA Wound GIT:JLLV Drainage (amount, consistency, odor) legs are edematous and weeping.  Periwound:Erythema and tenderness to lower legs.  Dressing procedure/placement/frequency: Cleanse perineal skin with soap and water and pat dry. Apply Gerhardts butt paste twice daily and PRN soilage.  No disposable briefs or underpads.  Cleanse legs with soap and water and pat dry. Apply Xeroform to redness on lower legs Wrap both legs with kerlix and tape.  Change daily.  Will not follow at this time.  Please re-consult if needed.  Maple Hudson MSN, RN, FNP-BC CWON Wound, Ostomy, Continence Nurse Pager [phone]

## 2019-05-13 NOTE — Progress Notes (Signed)
PROGRESS NOTE    Patient: Melanie Berry                            PCP: Marden Noble, MD                    DOB: 03-21-1965            DOA: 05/11/2019 NAT:[id number]             DOS: 05/13/2019, 11:23 AM   LOS: 1 day   Date of Service: The patient was seen and examined on 05/13/2019  Subjective:   The patient was seen and examined this morning, much more awake alert, less tearful.  Pain has improved, increased respiratory effort. O2 demand has increased from 2 L to 4 L of oxygen by nasal cannula, satting 99%.  Brief Narrative:   This is a 54 year old unfortunate female with multiple comorbidities, who is not ambulating at baseline due to progressive neurological deficit of Charcot arthropathy/neuropathy progressive, COPD O2 dependent, chronic lower extremity edema, venous stasis. Apparently sustained an accidental fall as she was reaching up for remote control at her nursing facility.  With the fall she sustained right femoral neck fracture.  Upon evaluation in ED patient was found septic likely due to cellulitis of her lower extremities Patient was subsequently admitted on the sepsis protocol, with aggressive IV fluid hydration and broad-spectrum antibiotics, orthopedic surgery was consulted. Obviously patient was not cleared for surgery due to sepsis.  05/13/2019 -  -Subsequently patient case was investigated further, acute on chronic multiple medical condition, physically and mentally declining, patient is on the hospice care. Hospice and palliative team was consulted -all in agreement to continue treating with current infection, sepsis, no surgical intervention.  Patient to return back to SNF in next 3-5 days under hospice care. Hospice, comfort care medication was reviewed-will be continued accordingly   Assessment & Plan:   Principal Problem:   Fracture of femoral neck, right, closed ([place]) Active Problems:   CHARCOT-MARIE-TOOTH DISEASE   Venous ulcers of both lower  extremities ([place])   Sepsis ([place])   Fall at home, initial encounter   Cellulitis of right leg   Obesity, Class III, BMI 40-49.9 (morbid obesity) ([place])   Closed right hip fracture (HCC)   COPD (chronic obstructive pulmonary disease) (HCC)     Fracture of femoral neck, right, closed ([place])   Fall at home, initial encounter -Orthopedics consulted >>> following accordingly -Pain control -Met with healthcare team including hospice, palliative care team nursing staff, patient's husband confirmed patient is nonambulatory, needs assist with all ADLs, this was an accidental fall, patient would not benefit from surgery at this time.  As she is not ambulating at baseline.    Sepsis ([place]) ?  Source/  Cellulitis of right leg   Venous stasis ulcer bilateral lower extremity -Patient met sepsis criteria with fever, tachycardia, leukocytosis and elevated lactic acid level -Suspect sepsis from infected venous stasis ulcers with cellulitis of the leg -Wound care consult -Blood cultures has been obtained >> no growth in past 24 hours -Patient has received Rocephin, will broaden antibiotics to cefepime and vancomycin -We will continue IV hydration per sepsis protocol     CHARCOT-MARIE-TOOTH DISEASE   Obesity, Class III, BMI 40-49.9 (morbid obesity) ([place]) -Patient with ambulatory dysfunction related to obesity and Charcot-Marie-Tooth disease -This complicates overall prognosis and care -Patient is under hospice care, with progressive decline  Pain syndrome -  Resuming hospice medications -At home she is on Mobic, MS Contin, p.o. morphine -Reinstating medication slowly for optimum comfort care measures   COPD (chronic obstructive pulmonary disease) (HCC) -Continue home meds -Worsening shortness of breath, increasing O2 demand from 2 L to 4 L -Likely exacerbated by IV fluid resuscitation due to sepsis, -Anticipating dose of Lasix today -We will monitor closely, DuoNeb bronchodilator  treatments  Depression /  anxiety -Patient seems to be on Abilify, trazodone and Effexor  -Along with Klonopin, Depakote Will resume carefully,   Multiple comorbidities, debility, progressively declining -Confirmed patient is under hospice care at the skilled nursing facility. -Met with the hospice team and palliative -all in agreement to continue treatment acute infection, no surgical invention. -DNR/DNI reinstated -Patient and husband are aware of the current plan and has agreed.     Nutritional status:        00000 Cultures; 05/12/2019 blood cultures x2 >>   Antimicrobials:  05/12/2019 cefepime/vancomycin >>  DVT prophylaxis: Lovenox SQ Code Status:   Code Status: DNR Family Communication:   Spoke with the patient's husband at the bedside yesterday in detail regarding her current condition. Due to multiple comorbidities, and not ambulating at base we not recommending surgical intervention for this acute fracture.  Met with hospice and palliative team, case was discussed in detail were all in agreement to continue IV antibiotics for an acute infection treatment, no surgical intervention, continue hospice medication, likely to be discharged back to hospice over the weekend   The above findings and plan of care has been discussed with patient and her husband in detail,  they expressed understanding and agreement of above. Disposition Plan: Likely to be discharged back to SNF under hospice over the weekend. Admission status:  NPATIEN -due to sepsis, right femoral neck fracture, needing IV antibiotics, surgical intervention  Consultants: Ortho   Procedures:   No admission procedures for hospital encounter.    Antimicrobials:  Anti-infectives (From admission, onward)   Start     Dose/Rate Route Frequency Ordered Stop   05/12/19 0600  cefTRIAXone (ROCEPHIN) 2 g in sodium chloride 0.9 % 100 mL IVPB     2 g 200 mL/hr over 30 Minutes Intravenous Every 24 hours 05/12/19  0055     05/11/19 2130  ceFEPIme (MAXIPIME) 2 g in sodium chloride 0.9 % 100 mL IVPB     2 g 200 mL/hr over 30 Minutes Intravenous  Once 05/11/19 2115 05/11/19 2305   05/11/19 2130  metroNIDAZOLE (FLAGYL) IVPB 500 mg     500 mg 100 mL/hr over 60 Minutes Intravenous  Once 05/11/19 2115 05/11/19 2231   05/11/19 2130  vancomycin (VANCOCIN) IVPB 1000 mg/200 mL premix  Status:  Discontinued     1,000 mg 200 mL/hr over 60 Minutes Intravenous  Once 05/11/19 2115 05/11/19 2125   05/11/19 2130  vancomycin (VANCOREADY) IVPB 2000 mg/400 mL     2,000 mg 200 mL/hr over 120 Minutes Intravenous  Once 05/11/19 2125 05/12/19 0220       Medication:  . acidophilus  2 capsule Oral TID  . ammonium lactate  1 application Topical BID  . ARIPiprazole  12 mg Oral Daily  . azelastine  2 spray Each Nare BID  . carvedilol  6.25 mg Oral BID WC  . clonazePAM  1 mg Oral TID  . divalproex  125 mg Oral BID  . enoxaparin (LOVENOX) injection  40 mg Subcutaneous Q12H  . Gerhardt's butt cream   Topical BID  . lubiprostone  24 mcg Oral Q breakfast  . morphine  30 mg Oral Q12H  . mupirocin ointment   Nasal BID  . senna  2 tablet Oral BID  . traZODone  50 mg Oral QHS  . venlafaxine XR  150 mg Oral Q breakfast    HYDROcodone-acetaminophen, ipratropium-albuterol, morphine injection, morphine CONCENTRATE   Objective:   Vitals:   05/13/19 0100 05/13/19 0413 05/13/19 0759 05/13/19 0930  BP: 126/80 (!) 125/94 121/64   Pulse:  (!) 125 (!) 130 (!) 115  Resp: _0 Temp: 98.3 F (36.8 C) 98.3 F (36.8 C) 99 F (37.2 C)   TempSrc: Oral Oral Oral   SpO2:  95% 99%   Weight:      Height:        Intake/Output Summary (Last 24 hours) at 05/13/2019 1123 Last data filed at 05/13/2019 0400 Gross per 24 hour  Intake 2536.58 ml  Output --  Net 2536.58 ml   Filed Weights   05/11/19 1948  Weight: 113.4 kg     Examination:   BP 121/64 (BP Location: Right Arm)   Pulse (!) 115   Temp 99 F (37.2 C) (Oral)    Resp 20   Ht _1  (1.626 m)   Wt 113.4 kg   SpO2 99%   BMI 42.91 kg/m    Physical Exam  Constitution:  Alert, cooperative, no distress,  Psychiatric: Awake alert following command, judgment and insight seem to be intact HEENT: Normocephalic, PERRL, otherwise with in Normal limits  Chest:Chest symmetric Cardio vascular:  S1/S2, RRR, No murmure, No Rubs or Gallops  pulmonary: Clear to auscultation bilaterally, respirations unlabored, negative wheezes / crackles Abdomen: Soft, non-tender, non-distended, bowel sounds,no masses, no organomegaly Muscular skeletal:  Severe generalized weakness, paraplegic, with minimal use of upper extremities, hands and fingers are contracted. Neuro: CNII-XII intact. ,  Loss of sensation and motor in lower extremity all the way to the waist area, loss of motor hands, fingers, minimal upper extremity strength, sensation intact Awake speech intact Extremities: +3 pitting edema lower extremities, +2 pulses  Skin: Dry, warm to touch, excessive scaling of the skin and edema over the foot and leg area Wounds: Please see nurses documentation for pressure ulcers        LABs:  CBC Latest Ref Rng & Units 05/13/2019 05/12/2019 05/11/2019  WBC 4.0 - 10.5 K/uL 8.9 11.9(H) 12.4(H)  Hemoglobin 12.0 - 15.0 g/dL 8.7(L) 9.7(L) 11.4(L)  Hematocrit 36.0 - 46.0 % 28.7(L) 31.6(L) 37.9  Platelets 150 - 400 K/uL 258 350 411(H)   CMP Latest Ref Rng & Units 05/13/2019 05/12/2019 05/11/2019  Glucose 70 - 99 mg/dL 91 118(H) 118(H)  BUN 6 - 20 mg/dL _2 Creatinine 0.44 - 1.00 mg/dL 0.45 0.50 0.54  Sodium 135 - 145 mmol/L 140 140 140  Potassium 3.5 - 5.1 mmol/L 3.2(L) 3.9 4.2  Chloride 98 - 111 mmol/L 103 99 98  CO2 22 - 32 mmol/L 30 32 31  Calcium 8.9 - 10.3 mg/dL 8.1(L) 8.3(L) 9.0  Total Protein 6.5 - 8.1 g/dL - - 7.0  Total Bilirubin 0.3 - 1.2 mg/dL - - 0.7  Alkaline Phos 38 - 126 U/L - - 86  AST 15 - 41 U/L - - 33  ALT 0 - 44 U/L - - 19        SIGNED: Deatra James, MD, FACP, FHM. [facility],  Pager [phone]  If 7PM-7AM, please contact night-coverage [email or web address] [email or web address] [facility] 05/13/2019,  11:23 AM

## 2019-05-13 NOTE — Progress Notes (Addendum)
   05/13/19 1330  Clinical Encounter Type  Visited With Patient and family together  Visit Type Initial  Referral From Nurse  Consult/Referral To Chaplain  When Chaplain entered the room patient appeared to be  sleep and her husband was standing up. Patient looked at me and I asked about AD, but could tell she was unable to respond or did not understand me.  Husband said they just gave her pain medicine. I said I would try back later.

## 2019-05-13 NOTE — Progress Notes (Signed)
Pt with ongoing tachycardia, covering practitioner Manuela Schwartz made aware, orders administered as prescribed. Pt developed shortness of breath, increased oxygen requirement from 2Liters to 6 Liters, pt with audible wheezing, crackles at bases bilaterally. Received fluid bolus previously in shift. HR remains in 130's/ Manuela Schwartz at bedside, lasix, cxray, ordered.

## 2019-05-13 NOTE — Progress Notes (Signed)
OVERNIGHT Patietn with increased oxygen requirements from 2 to 6 liters with increased work of breathing.  Was previously given I fluids with sepsis and ongoing tachycardia.   Bedside audible expiratory wheeze,crackles. Use of abdominal accessory muscles  Generalized edema  Chest x-ray and BNP pending  Duo neb and furosemide ordered

## 2019-05-13 NOTE — Progress Notes (Signed)
I discussed with patient and her husband with her significant medical problems that she would be high risk for any surgical intervention.  My recommendation is for nonoperative treatment.  If her medical condition improved markedly and she was still having severe pain in 2 to 3 weeks she still could have a [place] procedure but I would not recommend a hemiarthroplasty they are agreeable to this and I will sign off at this time.

## 2019-05-13 NOTE — Progress Notes (Signed)
Visit made with Palliative NP Harvest Dark to assess symptom management, patient's understanding of her condition and continued plan of care. Overnight events reviewed:  CXR ordered and completed over night d/t increased oxygen need and dyspnea. CXR revealed pulmonary congestion, patient given lasix. Continues on  4 liters via nasal cannula. Patient seen lying in bed alert, appeared to have increased work of breathing. Patient did voice her understanding that surgery was not a viable option. She would like to continue antibiotics to treat her infection and have better pain management. She also requested to have a foley catheter placed to reduce the need for frequent incontinence care related to diuresis. Palliative NP to make recommendations and discuss with attending physician. Discussed with staff RN Huntley Dec. Writer spoke with patient's home care social worker who has arranged for patient's husband Fayrene Fearing to be able to visit again today. Will continue to follow and provide support. Hospice team updated. Dayna Barker BSN, RN, [facility] [employer] [phone]

## 2019-05-13 NOTE — NC FL2 (Signed)
[place] LEVEL OF CARE SCREENING TOOL     IDENTIFICATION  Patient Name: Melanie Berry Birthdate: Dec 14, 1965 Sex: female Admission Date (Current Location): 05/11/2019  [place] and [place] Number:  Selena Lesser [id number] [place] and Address:  [facility], [address] [place], [place] [address]      Provider Number: [id number]  Attending Physician Name and Address:  Deatra James, MD  Relative Name and Phone Number:  Thai Hemrick [phone]    Current Level of Care: Hospital Recommended Level of Care: [place] Prior Approval Number:    Date Approved/Denied:   PASRR Number: [id number]  Discharge Plan: SNF    Current Diagnoses: Patient Active Problem List   Diagnosis Date Noted  . Sepsis ([place]) 05/12/2019  . Fall at home, initial encounter 05/12/2019  . Cellulitis of right leg 05/12/2019  . Obesity, Class III, BMI 40-49.9 (morbid obesity) ([place]) 05/12/2019  . Fracture of femoral neck, right, closed ([place]) 05/12/2019  . Closed right hip fracture ([place]) 05/12/2019  . COPD (chronic obstructive pulmonary disease) ([place]) 05/12/2019  . Venous ulcers of both lower extremities ([place]) 01/05/2018  . Pneumonia 11/15/2015  . URINARY INCONTINENCE 07/22/2006  . CYSTITIS, ACUTE 05/13/2006  . 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 CANDIDIASIS 01/06/2006  . Hyperlipidemia 01/06/2006  . HYPOKALEMIA 01/06/2006  . TOBACCO ABUSE 01/06/2006  . DEPRESSION 01/06/2006  . [place] DISEASE 01/06/2006  . Venous (peripheral) insufficiency 01/06/2006  . ALLERGIC RHINITIS 01/06/2006  . 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 TONGUE DISORDER 01/06/2006  . GERD 01/06/2006  . LOW BACK PAIN 01/06/2006  . SLEEP APNEA 01/06/2006  . LEG EDEMA 01/06/2006  . STATUS, ARTHRODESIS 01/06/2006    Orientation RESPIRATION BLADDER Height & Weight     Self, Situation, Place, Time  (3-4 liters) Incontinent Weight:  250 lb (113.4 kg) Height:  5\' 4"  (162.6 cm)  BEHAVIORAL SYMPTOMS/MOOD NEUROLOGICAL BOWEL NUTRITION STATUS      Continent (regular thin liquids)  AMBULATORY STATUS COMMUNICATION OF NEEDS Skin   Total Care Verbally Other (Comment)(cellulitis bilateral legs)                       Personal Care Assistance Level of Assistance  Bathing, Dressing Bathing Assistance: Maximum assistance   Dressing Assistance: Maximum assistance     Functional Limitations Info             SPECIAL CARE FACTORS FREQUENCY                       Contractures      Additional Factors Info  Code Status, Allergies, Isolation Precautions Code Status Info: DNR Allergies Info: Pregabin,sheelfish,clarithromycin,codeine,anaprox,contrast,fentanyl,nicotine patches, tramadol,sulfonamide,e-mycin,potassium,demerol, etc     Isolation Precautions Info: MRSA     Current Medications (05/13/2019):  This is the current hospital active medication list Current Facility-Administered Medications  Medication Dose Route Frequency Provider Last Rate Last Admin  . 0.9 %  sodium chloride infusion   Intravenous Continuous Skipper Cliche A, MD 50 mL/hr at 05/13/19 0829 Rate Change at 05/13/19 0829  . acidophilus (RISAQUAD) capsule 2 capsule  2 capsule Oral TID Deatra James, MD   2 capsule at 05/13/19 0803  . ammonium lactate (LAC-HYDRIN) 12 % lotion 1 application  1 application Topical BID Deatra James, MD   Stopped at 05/13/19 [phone]  . ARIPiprazole (ABILIFY) tablet 12 mg  12 mg Oral Daily Shahmehdi, Seyed A, MD   12 mg at 05/13/19 0803  .  azelastine (ASTELIN) 0.1 % nasal spray 2 spray  2 spray Each Nare BID Kendell Bane, MD   2 spray at 05/13/19 0804  . carvedilol (COREG) tablet 6.25 mg  6.25 mg Oral BID WC Shahmehdi, Seyed A, MD   6.25 mg at 05/13/19 0828  . cefTRIAXone (ROCEPHIN) 2 g in sodium chloride 0.9 % 100 mL IVPB  2 g Intravenous Q24H Lindajo Royal V, MD 200 mL/hr at 05/13/19 0629 2 g at  05/13/19 0629  . [START ON 05/14/2019] Chlorhexidine Gluconate Cloth 2 % PADS 6 each  6 each Topical Q0600 Shahmehdi, Seyed A, MD      . clonazePAM (KLONOPIN) tablet 1 mg  1 mg Oral TID Nevin Bloodgood A, MD   1 mg at 05/13/19 0802  . divalproex (DEPAKOTE) DR tablet 125 mg  125 mg Oral BID Nevin Bloodgood A, MD   125 mg at 05/13/19 0803  . enoxaparin (LOVENOX) injection 40 mg  40 mg Subcutaneous Q12H Andris Baumann, MD   40 mg at 05/13/19 6761  . Gerhardt's butt cream   Topical BID Kennedy Bucker, MD      . HYDROcodone-acetaminophen (NORCO/VICODIN) 5-325 MG per tablet 1-2 tablet  1-2 tablet Oral Q6H PRN Andris Baumann, MD   1 tablet at 05/13/19 1316  . ipratropium-albuterol (DUONEB) 0.5-2.5 (3) MG/3ML nebulizer solution 3 mL  3 mL Nebulization Q4H PRN Manuela Schwartz, NP      . lubiprostone (AMITIZA) capsule 24 mcg  24 mcg Oral Q breakfast Nevin Bloodgood A, MD   24 mcg at 05/13/19 0803  . morphine (MS CONTIN) 12 hr tablet 30 mg  30 mg Oral Q12H Shahmehdi, Seyed A, MD   30 mg at 05/13/19 9509  . morphine 2 MG/ML injection 2 mg  2 mg Intravenous Q1H PRN Joylene Draft, NP   2 mg at 05/13/19 1104  . morphine CONCENTRATE 10 MG/0.5ML oral solution 10 mg  10 mg Oral Q2H PRN Shahmehdi, Seyed A, MD      . mupirocin ointment (BACTROBAN) 2 %   Nasal BID Kendell Bane, MD   Given at 05/13/19 1317  . senna (SENOKOT) tablet 17.2 mg  2 tablet Oral BID Shahmehdi, Seyed A, MD   17.2 mg at 05/13/19 0802  . traZODone (DESYREL) tablet 50 mg  50 mg Oral QHS Shahmehdi, Seyed A, MD   50 mg at 05/12/19 2125  . venlafaxine XR (EFFEXOR-XR) 24 hr capsule 150 mg  150 mg Oral Q breakfast Shahmehdi, Seyed A, MD   150 mg at 05/13/19 0802     Discharge Medications: Please see discharge summary for a list of discharge medications.  Relevant Imaging Results:  Relevant Lab Results:   Additional Information SSN: [id number]  Sally-Ann Cutbirth, Lemar Livings, LCSW

## 2019-05-13 NOTE — Consult Note (Signed)
Consultation Note Date: 05/13/2019   Patient Name: Melanie Berry  DOB: [birth year]  MRN: [id number]  Age / Sex: 54 y.o., female  PCP: Marden Noble, MD Referring Physician: Deatra James, MD  Reason for Consultation: Establishing goals of care  HPI/Patient Profile: 54 y.o. female  with past medical history of MS, nonambulatory at baseline, COPD, charcot's arthropathy, chronic lower extremity venous stasis with venous stasis ulcers, GERD, and depression admitted on 05/11/2019 with a fall at her facility. X-ray of right hip revealed femoral neck fracture on the right. Patient also met sepsis criteria - possibly from cellulitis of legs.  Chest x ray reveals pulmonary vascular congestion and interstitial edema - she has received lasix. Patient is followed by hospice at [facility]. PMT consulted for [place].  Clinical Assessment and Goals of Care: I have reviewed medical records including EPIC notes, labs and imaging, received report from RN and Dr. Roger Shelter, assessed the patient and then met with patient and her spouse  to discuss diagnosis prognosis, GOC, EOL wishes, disposition and options.  I introduced Palliative Medicine as specialized medical care for people living with serious illness. It focuses on providing relief from the symptoms and stress of a serious illness. The goal is to improve quality of life for both the patient and the family.  Patient tells me she has not been doing well even prior to admission. Declining health and bed bound at baseline. She is followed by hospice services.   We discussed her fractured hip - patient understands she is high risk for surgical repair and has chosen to avoid surgery and utilize aggressive pain management measures to ensure comfort. She is interested in continuing treatment for sepsis and respiratory distress; however main goal is to promote comfort - she does not want to be in pain.  Discussed plan with  spouse - he understands and agrees.    Questions and concerns were addressed. The family was encouraged to call with questions or concerns.   Primary Decision Maker PATIENT, joined by spouse    SUMMARY OF RECOMMENDATIONS   No surgery - patient would like to treat sepsis/respiratory distress but most important goal to her is pain management/relief of suffering - please prioritize pain management over other treatment goals  Foley catheter added to reduce movement needed while receiving lasix  Regular diet requested by patient and ordered  Code Status/Advance Care Planning:  DNR   Symptom Management:   Patient on MS contin - added 2 mg IV morphine q1hr PRN - to use for pain, dyspnea, or prior to any interventions/bath (any movement that increases pain in hip/legs)  Prognosis:   Unable to determine  Discharge Planning: [place] with Hospice      Primary Diagnoses: Present on Admission: . CHARCOT-MARIE-TOOTH DISEASE . Venous ulcers of both lower extremities ([place]) . Closed right hip fracture ([place])   I have reviewed the medical record, interviewed the patient and family, and examined the patient. The following aspects are pertinent.  Past Medical History:  Diagnosis Date  . Anemia   . Charcot's arthropathy   . COPD (chronic obstructive pulmonary disease) ([place])   . COPD (chronic obstructive pulmonary disease) ([place])   . Fluid overload   . GERD (gastroesophageal reflux disease)   . MDD (major depressive disorder)    Social History   Socioeconomic History  . Marital status: Married    Spouse name: Not on file  . Number of children: Not on file  . Years of education: Not  on file  . Highest education level: Not on file  Occupational History  . Not on file  Tobacco Use  . Smoking status: Former Research scientist (life sciences)  . Smokeless tobacco: Never Used  Substance and Sexual Activity  . Alcohol use: No  . Drug use: No  . Sexual activity: Not on file  Other Topics  Concern  . Not on file  Social History Narrative  . Not on file   Social Determinants of Health   Financial Resource Strain:   . Difficulty of Paying Living Expenses:   Food Insecurity:   . Worried About Charity fundraiser in the Last Year:   . Arboriculturist in the Last Year:   Transportation Needs:   . Film/video editor (Medical):   Marland Kitchen Lack of Transportation (Non-Medical):   Physical Activity:   . Days of Exercise per Week:   . Minutes of Exercise per Session:   Stress:   . Feeling of Stress :   Social Connections:   . Frequency of Communication with Friends and Family:   . Frequency of Social Gatherings with Friends and Family:   . Attends Religious Services:   . Active Member of Clubs or Organizations:   . Attends Archivist Meetings:   Marland Kitchen Marital Status:    Family History  Family history unknown: Yes   Scheduled Meds: . acidophilus  2 capsule Oral TID  . ammonium lactate  1 application Topical BID  . ARIPiprazole  12 mg Oral Daily  . azelastine  2 spray Each Nare BID  . carvedilol  6.25 mg Oral BID WC  . [START ON 05/14/2019] Chlorhexidine Gluconate Cloth  6 each Topical Q0600  . clonazePAM  1 mg Oral TID  . divalproex  125 mg Oral BID  . enoxaparin (LOVENOX) injection  40 mg Subcutaneous Q12H  . Gerhardt's butt cream   Topical BID  . lubiprostone  24 mcg Oral Q breakfast  . morphine  30 mg Oral Q12H  . mupirocin ointment   Nasal BID  . senna  2 tablet Oral BID  . traZODone  50 mg Oral QHS  . venlafaxine XR  150 mg Oral Q breakfast   Continuous Infusions: . sodium chloride 50 mL/hr at 05/13/19 0829  . cefTRIAXone (ROCEPHIN)  IV 2 g (05/13/19 0629)   PRN Meds:.HYDROcodone-acetaminophen, ipratropium-albuterol, morphine injection Allergies  Allergen Reactions  . Pregabalin Swelling  . Shellfish-Derived Products Swelling  . Aspirin Other (See Comments)    REACTION: Unknown reaction  . Clarithromycin Other (See Comments)    REACTION: Unkown  reaction  . Codeine Other (See Comments)    Unknown reaction  . Contrast Media [Iodinated Diagnostic Agents] Other (See Comments)    unknown  . Fentanyl Other (See Comments)    REACTION: Unknown reaction  . Gabapentin Other (See Comments)    REACTION: Unknown reaction  . Methocarbamol Other (See Comments)    REACTION: Unknown reaction  . Nicotine Other (See Comments)    REACTION: Allergic to patch only Unknown reaction  . Nortriptyline Hcl Other (See Comments)    REACTION: Unknown reaction  . Nortriptyline Hcl Other (See Comments)    Other reaction(s): Unknown REACTION: Unknown reaction  . Potassium Iodide Other (See Comments)    REACTION: Unknown reaction  . Propoxyphene N-Acetaminophen Other (See Comments)    REACTION: Unknown reaction  . Sulfonamide Derivatives Other (See Comments)    REACTION: Unknown reaction  . Tramadol Hcl Other (See Comments)    REACTION:  Severe headaches   Review of Systems  Respiratory: Positive for shortness of breath.   Musculoskeletal:       Bilateral lower extremity pain, R hip pain with movement    Physical Exam Pulmonary:     Comments: tachypnea Musculoskeletal:     Comments: Swelling and cellulitis noted in BLE  Skin:    General: Skin is warm and dry.  Neurological:     Mental Status: She is alert and oriented to person, place, and time.     Vital Signs: BP 118/82 (BP Location: Right Arm)   Pulse 70   Temp 98.3 F (36.8 C) (Oral)   Resp 20   Ht _0  (1.626 m)   Wt 113.4 kg   SpO2 93%   BMI 42.91 kg/m  Pain Scale: 0-10   Pain Score: 10-Worst pain ever   SpO2: SpO2: 93 % O2 Device:SpO2: 93 % O2 Flow Rate: .O2 Flow Rate (L/min): 4 L/min  IO: Intake/output summary:   Intake/Output Summary (Last 24 hours) at 05/13/2019 1414 Last data filed at 05/13/2019 0400 Gross per 24 hour  Intake 2536.58 ml  Output --  Net 2536.58 ml    LBM:   Baseline Weight: Weight: 113.4 kg Most recent weight: Weight: 113.4 kg      Palliative Assessment/Data: PPS 30%    Time Total: 50 minutes Greater than 50%  of this time was spent counseling and coordinating care related to the above assessment and plan.  Juel Burrow, DNP, AGNP-C Palliative Medicine Team [phone] Pager: [phone]

## 2019-05-14 DIAGNOSIS — Z7189 Other specified counseling: Secondary | ICD-10-CM

## 2019-05-14 DIAGNOSIS — Z515 Encounter for palliative care: Secondary | ICD-10-CM

## 2019-05-14 DIAGNOSIS — S72001P Fracture of unspecified part of neck of right femur, subsequent encounter for closed fracture with malunion: Secondary | ICD-10-CM

## 2019-05-14 LAB — BASIC METABOLIC PANEL
Anion gap: 7 (ref 5–15)
BUN: 11 mg/dL (ref 6–20)
CO2: 32 mmol/L (ref 22–32)
Calcium: 8 mg/dL — ABNORMAL LOW (ref 8.9–10.3)
Chloride: 100 mmol/L (ref 98–111)
Creatinine, Ser: 0.46 mg/dL (ref 0.44–1.00)
GFR calc Af Amer: >60 mL/min (ref >60)
GFR calc non Af Amer: >60 mL/min (ref >60)
Glucose, Bld: 105 mg/dL — ABNORMAL HIGH (ref 70–99)
Potassium: 3 mmol/L — ABNORMAL LOW (ref 3.5–5.1)
Sodium: 139 mmol/L (ref 135–145)

## 2019-05-14 LAB — CBC
HCT: 25.4 % — ABNORMAL LOW (ref 36.0–46.0)
Hemoglobin: 8 g/dL — ABNORMAL LOW (ref 12.0–15.0)
MCH: 27.9 pg (ref 26.0–34.0)
MCHC: 31.5 g/dL (ref 30.0–36.0)
MCV: 88.5 fL (ref 80.0–100.0)
Platelets: 215 10*3/uL (ref 150–400)
RBC: 2.87 MIL/uL — ABNORMAL LOW (ref 3.87–5.11)
RDW: 16.7 % — ABNORMAL HIGH (ref 11.5–15.5)
WBC: 8.6 10*3/uL (ref 4.0–10.5)
nRBC: 0 % (ref 0.0–0.2)

## 2019-05-14 MED ORDER — POTASSIUM CHLORIDE CRYS ER 20 MEQ PO TBCR
40.0000 meq | EXTENDED_RELEASE_TABLET | Freq: Once | ORAL | Status: AC
  Administered 2019-05-14: 40 meq via ORAL
  Filled 2019-05-14: qty 2

## 2019-05-14 MED ORDER — VENLAFAXINE HCL ER 75 MG PO CP24
75.0000 mg | ORAL_CAPSULE | Freq: Every day | ORAL | Status: DC
  Administered 2019-05-15 – 2019-05-17 (×3): 75 mg via ORAL
  Filled 2019-05-14 (×3): qty 1

## 2019-05-14 NOTE — Progress Notes (Signed)
Daily Progress Note   Patient Name: Melanie Berry       Date: 05/14/2019 DOB: [birth year]  Age: 54 y.o. MRN#: [id number] Attending Physician: Deatra James, MD Primary Care Physician: Marden Noble, MD Admit Date: 05/11/2019  Reason for Consultation/Follow-up: Establishing goals of care  Subjective: Reports some pain in lower extremities - hip pain okay. Breathing better today than yesterday. Asking questions about return to [facility].  Length of Stay: 2  Current Medications: Scheduled Meds:  . acidophilus  2 capsule Oral TID  . ammonium lactate  1 application Topical BID  . ARIPiprazole  12 mg Oral Daily  . azelastine  2 spray Each Nare BID  . carvedilol  6.25 mg Oral BID WC  . Chlorhexidine Gluconate Cloth  6 each Topical Q0600  . divalproex  125 mg Oral BID  . enoxaparin (LOVENOX) injection  40 mg Subcutaneous Q12H  . feeding supplement (ENSURE ENLIVE)  237 mL Oral BID BM  . Gerhardt's butt cream   Topical BID  . lubiprostone  24 mcg Oral Q breakfast  . morphine  30 mg Oral Q12H  . mupirocin ointment   Nasal BID  . potassium chloride  40 mEq Oral Once  . senna  2 tablet Oral BID  . traZODone  50 mg Oral QHS  . [START ON 05/15/2019] venlafaxine XR  75 mg Oral Q breakfast    Continuous Infusions: . sodium chloride 50 mL/hr at 05/13/19 0829  . cefTRIAXone (ROCEPHIN)  IV 2 g (05/14/19 0506)    PRN Meds: HYDROcodone-acetaminophen, ipratropium-albuterol, morphine injection  Physical Exam Constitutional:      General: She is not in acute distress. Pulmonary:     Effort: Pulmonary effort is normal. No respiratory distress.     Comments: More regular today, on nasal cannula Musculoskeletal:     Comments: Swelling and cellulitis in BLE  Skin:    General: Skin is warm  and dry.  Neurological:     Mental Status: She is alert and oriented to person, place, and time.  Psychiatric:        Attention and Perception: Attention normal.        Mood and Affect: Affect is tearful.             Vital Signs: BP 99/73   Pulse (!) 106   Temp 99 F (37.2 C) (Oral)   Resp 18   Ht '5\' 4"'$  (1.626 m)   Wt 113.4 kg   SpO2 98%   BMI 42.91 kg/m  SpO2: SpO2: 98 % O2 Device: O2 Device: Nasal Cannula O2 Flow Rate: O2 Flow Rate (L/min): 4 L/min  Intake/output summary:   Intake/Output Summary (Last 24 hours) at 05/14/2019 1114 Last data filed at 05/14/2019 1518 Gross per 24 hour  Intake 300 ml  Output 1175 ml  Net -875 ml   LBM:   Baseline Weight: Weight: 113.4 kg Most recent weight: Weight: 113.4 kg       Palliative Assessment/Data: PPS 30%    Flowsheet Rows     Most Recent Value  Intake Tab  Referral Department  Hospitalist  Unit at Time of Referral  Med/Surg Unit  Palliative Care Primary Diagnosis  Trauma  Date Notified  05/12/19  Palliative Care Type  New Palliative care  Reason for referral  Clarify Goals of Care  Date of Admission  05/11/19  Date first seen by Palliative Care  05/13/19  # of days Palliative referral response time  1 Day(s)  # of days IP prior to Palliative referral  1  Clinical Assessment  Palliative Performance Scale Score  30%  Psychosocial & Spiritual Assessment  Palliative Care Outcomes  Patient/Family meeting held?  Yes  Who was at the meeting?  patient and spouse  Palliative Care Outcomes  Clarified goals of care, Counseled regarding hospice, Provided psychosocial or spiritual support, Improved pain interventions      Patient Active Problem List   Diagnosis Date Noted  . Goals of care, counseling/discussion   . Palliative care by specialist   . Sepsis ([place]) 05/12/2019  . Fall at home, initial encounter 05/12/2019  . Cellulitis of right leg 05/12/2019  . Obesity, Class III, BMI 40-49.9 (morbid obesity) ([place])  05/12/2019  . Fracture of femoral neck, right, closed ([place]) 05/12/2019  . Closed right hip fracture ([place]) 05/12/2019  . COPD (chronic obstructive pulmonary disease) ([place]) 05/12/2019  . Venous ulcers of both lower extremities ([place]) 01/05/2018  . Pneumonia 11/15/2015  . URINARY INCONTINENCE 07/22/2006  . CYSTITIS, ACUTE 05/13/2006  . 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 CANDIDIASIS 01/06/2006  . Hyperlipidemia 01/06/2006  . HYPOKALEMIA 01/06/2006  . TOBACCO ABUSE 01/06/2006  . DEPRESSION 01/06/2006  . [place] DISEASE 01/06/2006  . Venous (peripheral) insufficiency 01/06/2006  . ALLERGIC RHINITIS 01/06/2006  . 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 TONGUE DISORDER 01/06/2006  . GERD 01/06/2006  . LOW BACK PAIN 01/06/2006  . SLEEP APNEA 01/06/2006  . LEG EDEMA 01/06/2006  . STATUS, ARTHRODESIS 01/06/2006    Palliative Care Assessment & Plan   HPI: 54 y.o. female  with past medical history of MS, nonambulatory at baseline, COPD, charcot's arthropathy, chronic lower extremity venous stasis with venous stasis ulcers, GERD, and depression admitted on 05/11/2019 with a fall at her facility. X-ray of right hip revealed femoral neck fracture on the right. Patient also met sepsis criteria - possibly from cellulitis of legs.  Chest x ray reveals pulmonary vascular congestion and interstitial edema - she has received lasix. Patient is followed by hospice at [facility]. PMT consulted for [place].  Assessment: Follow up with Ms. Bury - symptoms better controlled today and respiratory status much improved. Plan remains to return to [place] with hospice involved. Continue current symptom management plan.  Recommendations/Plan: No surgery - patient would like to treat sepsis/respiratory distress but most important goal to her is pain management/relief of suffering - please prioritize pain management over other treatment goals  Please premedicate with IV morphine  prior to movement  Code Status:  DNR  Prognosis:   Unable to determine  Discharge Planning:  [place] with Hospice  Care plan was discussed with hospice liaison, Dr. Roger Shelter  Thank you for allowing the Palliative Medicine Team to assist in the care of this patient.   Total Time 15 minutes Prolonged Time Billed  no       Greater than 50%  of this time was spent counseling and coordinating care related to the above assessment and plan.  Juel Burrow, DNP, [facility] Palliative Medicine Team Team Phone # [phone]  Pager [phone]

## 2019-05-14 NOTE — Progress Notes (Signed)
Patients MEWS score is now 3, Notified Dr. Guadalupe Maple via page. Pulse is 112 , BP 95/61.

## 2019-05-14 NOTE — Progress Notes (Signed)
PROGRESS NOTE    Patient: Melanie Berry                            PCP: Marden Noble, MD                    DOB: 04-Aug-1965            DOA: 05/11/2019 PTW:[id number]             DOS: 05/14/2019, 1:41 PM   LOS: 2 days   Date of Service: The patient was seen and examined on 05/14/2019  Subjective:   The patient was seen and examined this morning, much more awake alert, less tearful.  Pain has improved, increased respiratory effort. O2 demand has increased from 2 L to 4 L of oxygen by nasal cannula, satting 99%.  Brief Narrative:   This is a 54 year old unfortunate female with multiple comorbidities, who is not ambulating at baseline due to progressive neurological deficit of Charcot arthropathy/neuropathy progressive, COPD O2 dependent, chronic lower extremity edema, venous stasis. Apparently sustained an accidental fall as she was reaching up for remote control at her nursing facility.  With the fall she sustained right femoral neck fracture.  Upon evaluation in ED patient was found septic likely due to cellulitis of her lower extremities Patient was subsequently admitted on the sepsis protocol, with aggressive IV fluid hydration and broad-spectrum antibiotics, orthopedic surgery was consulted. Obviously patient was not cleared for surgery due to sepsis.  05/13/2019 -  -Subsequently patient case was investigated further, acute on chronic multiple medical condition, physically and mentally declining, patient is on the hospice care. Hospice and palliative team was consulted -all in agreement to continue treating with current infection, sepsis, no surgical intervention.  Patient to return back to SNF in next 3-5 days under hospice care. Hospice, comfort care medication was reviewed-will be continued accordingly  05/14/2019 Patient was seen and examined, awake alert following.  Patient was found mildly hypotensive, tachycardic this morning Improved mentation, improved pain, Met with  palliative care team today recommending continue current treatment, Was recommended to continue current medication for comfort care measures Hospice on palliative recommended continue treat treatable infection.   Assessment & Plan:   Principal Problem:   Fracture of femoral neck, right, closed ([place]) Active Problems:   CHARCOT-MARIE-TOOTH DISEASE   Venous ulcers of both lower extremities (HCC)   Sepsis ([place])   Fall at home, initial encounter   Cellulitis of right leg   Obesity, Class III, BMI 40-49.9 (morbid obesity) (HCC)   Closed right hip fracture (HCC)   COPD (chronic obstructive pulmonary disease) (HCC)   Goals of care, counseling/discussion   Palliative care by specialist     Fracture of femoral neck, right, closed ([place])   Fall at home, initial encounter -Orthopedics consulted >>> following accordingly -not recommending surgical intervention -Pain control -Met with healthcare team including hospice, palliative care team nursing staff, patient's husband confirmed patient is nonambulatory, needs assist with all ADLs, this was an accidental fall, patient would not benefit from surgical intervention, as she is not ambulating at baseline.    Sepsis ([place]) ?  Source/  Cellulitis of right leg   Venous stasis ulcer bilateral lower extremity -Patient met sepsis criteria with fever, tachycardia, leukocytosis and elevated lactic acid level -Suspect sepsis from infected venous stasis ulcers with cellulitis of the leg -Mildly hypertensive this a.m., likely due comfort care meds, including narcotics antipsychotics, as  patient is improving from sepsis -Wound care consult -Blood cultures has been obtained >> no growth to date  -Patient has received Rocephin, cefepime and vancomycin -Cefepime and vancomycin was DC'd on 05/13/2019, continue with Rocephin -Status post IV fluid hydration.    CHARCOT-MARIE-TOOTH DISEASE   Obesity, Class III, BMI 40-49.9 (morbid obesity) ([place]) -Patient  with ambulatory dysfunction related to obesity and Charcot-Marie-Tooth disease -This complicates overall prognosis and care -Patient is under hospice care, with progressive decline  Pain syndrome -Resuming hospice medications -At home she is on Mobic, MS Contin, p.o. morphine -Reinstating medication slowly for optimum comfort care measures   COPD (chronic obstructive pulmonary disease) (HCC) -Continue home meds -Worsening shortness of breath, increasing O2 demand from 2 L to 4 L -Likely exacerbated by IV fluid resuscitation due to sepsis, -Anticipating dose of Lasix today -We will monitor closely, DuoNeb bronchodilator treatments  Depression /  anxiety -Patient seems to be on Abilify, trazodone and Effexor  -Along with Klonopin, Depakote Will resume carefully,   Multiple comorbidities, debility, progressively declining -Confirmed patient is under hospice care at the skilled nursing facility. -Met with the hospice team and palliative -all in agreement to continue treatment acute infection, no surgical invention. -DNR/DNI reinstated -Patient and husband are aware of the current plan and has agreed.     Nutritional status:  Nutrition Problem: Increased nutrient needs Etiology: catabolic illness(COPD) Signs/Symptoms: estimated needs Interventions: Ensure Enlive (each supplement provides 350kcal and 20 grams of protein)  Cultures; 05/11/2019 blood cultures x2 >>  05/13/2019 nares positive for MRSA, 05/11/2019 urine cultures-negative to date 05/11/2019 SARS-CoV-2 negative Antimicrobials: -05/12/2019 Rocephin >>  -05/12/2019 cefepime/vancomycin >>DC'd on 05/13/2019  -Cefepime and vancomycin was , continue with Rocephin DVT prophylaxis: Lovenox SQ Code Status:   Code Status: DNR Family Communication:   We have discussed with the patient and her husband in detail regarding her current condition.  Due to multiple comorbidities, and not ambulating at base we not recommending  surgical intervention for this acute fracture.  Met with hospice and palliative team, case was discussed in detail were all in agreement to continue IV antibiotics for an acute infection treatment, no surgical intervention, continue hospice medication, likely to be discharged back to hospice over the weekend   The above findings and plan of care has been discussed with patient and her husband in detail,  they expressed understanding and agreement of above.  Disposition Plan: Back to SNF with hospice care in next 2-3 days Admission status:  NPATIEN -due to sepsis, right femoral neck fracture, needing IV antibiotics, surgical intervention  Consultants: Ortho   Procedures:   No admission procedures for hospital encounter.    Antimicrobials:  Anti-infectives (From admission, onward)   Start     Dose/Rate Route Frequency Ordered Stop   05/12/19 0600  cefTRIAXone (ROCEPHIN) 2 g in sodium chloride 0.9 % 100 mL IVPB     2 g 200 mL/hr over 30 Minutes Intravenous Every 24 hours 05/12/19 0055     05/11/19 2130  ceFEPIme (MAXIPIME) 2 g in sodium chloride 0.9 % 100 mL IVPB     2 g 200 mL/hr over 30 Minutes Intravenous  Once 05/11/19 2115 05/11/19 2305   05/11/19 2130  metroNIDAZOLE (FLAGYL) IVPB 500 mg     500 mg 100 mL/hr over 60 Minutes Intravenous  Once 05/11/19 2115 05/11/19 2231   05/11/19 2130  vancomycin (VANCOCIN) IVPB 1000 mg/200 mL premix  Status:  Discontinued     1,000 mg 200 mL/hr over 60 Minutes  Intravenous  Once 05/11/19 2115 05/11/19 2125   05/11/19 2130  vancomycin (VANCOREADY) IVPB 2000 mg/400 mL     2,000 mg 200 mL/hr over 120 Minutes Intravenous  Once 05/11/19 2125 05/12/19 0220       Medication:  . acidophilus  2 capsule Oral TID  . ammonium lactate  1 application Topical BID  . ARIPiprazole  12 mg Oral Daily  . azelastine  2 spray Each Nare BID  . carvedilol  6.25 mg Oral BID WC  . Chlorhexidine Gluconate Cloth  6 each Topical Q0600  . divalproex  125 mg Oral  BID  . enoxaparin (LOVENOX) injection  40 mg Subcutaneous Q12H  . feeding supplement (ENSURE ENLIVE)  237 mL Oral BID BM  . Gerhardt's butt cream   Topical BID  . lubiprostone  24 mcg Oral Q breakfast  . morphine  30 mg Oral Q12H  . mupirocin ointment   Nasal BID  . senna  2 tablet Oral BID  . traZODone  50 mg Oral QHS  . [START ON 05/15/2019] venlafaxine XR  75 mg Oral Q breakfast    HYDROcodone-acetaminophen, ipratropium-albuterol, morphine injection   Objective:   Vitals:   05/13/19 2350 05/14/19 0452 05/14/19 0744 05/14/19 1059  BP: 102/70 1_0  Pulse: (!) 106 (!) 110 (!) 107 (!) 106  Resp: _1 Temp: 98.5 F (36.9 C) 98.8 F (37.1 C) 98.6 F (37 C) 99 F (37.2 C)  TempSrc: Oral Oral Oral Oral  SpO2: 94% 95% 91% 98%  Weight:      Height:        Intake/Output Summary (Last 24 hours) at 05/14/2019 1341 Last data filed at 05/14/2019 0452 Gross per 24 hour  Intake 300 ml  Output 1175 ml  Net -875 ml   Filed Weights   05/11/19 1948  Weight: 113.4 kg     Examination:  BP 99/73   Pulse (!) 106   Temp 99 F (37.2 C) (Oral)   Resp 18   Ht _2  (1.626 m)   Wt 113.4 kg   SpO2 98%   BMI 42.91 kg/m    Physical Exam  Constitution:  Alert, cooperative, no distress,  Psychiatric: Flat affect, less tearful today HEENT: Normocephalic, PERRL, otherwise with in Normal limits  Chest:Chest symmetric Cardio vascular:  S1/S2, RRR, No murmure, No Rubs or Gallops  pulmonary: Clear to auscultation bilaterally, respirations unlabored, negative wheezes / crackles Abdomen: Soft, non-tender, non-distended, bowel sounds,no masses, no organomegaly Muscular skeletal:  Quadriplegic with lower extremity edema, contracted hands, motor intact in upper extremities Neuro: CNII-XII intact. ,  Loss of motor and lower extremity, sensation and motor intact in upper extremity, contracted hand and fingers, Extremities:  +3 pitting edema lower extremities, +2 pulses  Skin:  Dry, warm to touch, negative for any Rashes, No open wounds Wounds: per nursing documentation         LABs:  CBC Latest Ref Rng & Units 05/14/2019 05/13/2019 05/12/2019  WBC 4.0 - 10.5 K/uL 8.6 8.9 11.9(H)  Hemoglobin 12.0 - 15.0 g/dL 8.0(L) 8.7(L) 9.7(L)  Hematocrit 36.0 - 46.0 % 25.4(L) 28.7(L) 31.6(L)  Platelets 150 - 400 K/uL 215 258 350   CMP Latest Ref Rng & Units 05/14/2019 05/13/2019 05/12/2019  Glucose 70 - 99 mg/dL 105(H) 91 118(H)  BUN 6 - 20 mg/dL _3 Creatinine 0.44 - 1.00 mg/dL 0.46 0.45 0.50  Sodium 135 - 145 mmol/L 139 140 140  Potassium 3.5 -  5.1 mmol/L 3.0(L) 3.2(L) 3.9  Chloride 98 - 111 mmol/L 100 103 99  CO2 22 - 32 mmol/L 32 30 32  Calcium 8.9 - 10.3 mg/dL 8.0(L) 8.1(L) 8.3(L)  Total Protein 6.5 - 8.1 g/dL - - -  Total Bilirubin 0.3 - 1.2 mg/dL - - -  Alkaline Phos 38 - 126 U/L - - -  AST 15 - 41 U/L - - -  ALT 0 - 44 U/L - - -        SIGNED: Deatra James, MD, FACP, FHM. [facility],  Pager [phone]  If 7PM-7AM, please contact night-coverage [email or web address] 05/14/2019, 1:41 PM

## 2019-05-14 NOTE — Progress Notes (Signed)
Patients mews 2 , notified Dr. Guadalupe Maple P 107 Bp 98/68. Will continue to monitor

## 2019-05-14 NOTE — Progress Notes (Signed)
Visit made. Patient seen sitting up in bed, Palliative NP present. Patient does appear to be less dyspneic. Oxygen remains at 4 liters base line is 2 liters. She has received 2 doses of hydrocodone-acetaminophen 5-325mg  and 2 doses of IV morphine. She continues on MS Contin 30 mg bid, IV antiobiotics and fluids.  Writer assisted patient with ordering lunch, appetite remains fair. Patient's husband back to visit again today. Emotional support given. Possible discharge back to [facility] tomorrow. Will continue to follow and update hospice team. Dayna Barker BSN, RN, [facility] Liaison [facility]  [phone]

## 2019-05-14 NOTE — TOC Progression Note (Signed)
Transition of Care [facility]) - Progression Note    Patient Details  Name: Melanie Berry MRN: [id number] Date of Birth: 1965/10/01  Transition of Care [facility]) CM/SW Contact  Levi Klaiber, Lemar Livings, LCSW Phone Number: 05/14/2019, 3:53 PM  Clinical Narrative:   Spoke with [facility] who report since she has had both of her COVID vaccinations she will not need a new COVID test before her return there. Have informed MD of this. He possibly feels she may be ready to transfer back tomorrow. Have let weekend coverage know of this. Hospice aware of this also.          Expected Discharge Plan and Services                                                 Social Determinants of Health (SDOH) Interventions    Readmission Risk Interventions No flowsheet data found.

## 2019-05-15 DIAGNOSIS — J439 Emphysema, unspecified: Secondary | ICD-10-CM

## 2019-05-15 DIAGNOSIS — L03115 Cellulitis of right lower limb: Secondary | ICD-10-CM

## 2019-05-15 LAB — BASIC METABOLIC PANEL
Anion gap: 9 (ref 5–15)
BUN: 13 mg/dL (ref 6–20)
CO2: 32 mmol/L (ref 22–32)
Calcium: 8.3 mg/dL — ABNORMAL LOW (ref 8.9–10.3)
Chloride: 100 mmol/L (ref 98–111)
Creatinine, Ser: 0.34 mg/dL — ABNORMAL LOW (ref 0.44–1.00)
GFR calc Af Amer: >60 mL/min (ref >60)
GFR calc non Af Amer: >60 mL/min (ref >60)
Glucose, Bld: 94 mg/dL (ref 70–99)
Potassium: 3.4 mmol/L — ABNORMAL LOW (ref 3.5–5.1)
Sodium: 141 mmol/L (ref 135–145)

## 2019-05-15 LAB — CBC
HCT: 24.8 % — ABNORMAL LOW (ref 36.0–46.0)
Hemoglobin: 7.6 g/dL — ABNORMAL LOW (ref 12.0–15.0)
MCH: 27.1 pg (ref 26.0–34.0)
MCHC: 30.6 g/dL (ref 30.0–36.0)
MCV: 88.6 fL (ref 80.0–100.0)
Platelets: 218 10*3/uL (ref 150–400)
RBC: 2.8 MIL/uL — ABNORMAL LOW (ref 3.87–5.11)
RDW: 16.7 % — ABNORMAL HIGH (ref 11.5–15.5)
WBC: 8.4 10*3/uL (ref 4.0–10.5)
nRBC: 0 % (ref 0.0–0.2)

## 2019-05-15 MED ORDER — POTASSIUM CHLORIDE CRYS ER 20 MEQ PO TBCR
20.0000 meq | EXTENDED_RELEASE_TABLET | Freq: Once | ORAL | Status: AC
  Administered 2019-05-15: 08:00:00 20 meq via ORAL
  Filled 2019-05-15: qty 1

## 2019-05-15 NOTE — TOC Progression Note (Signed)
Transition of Care [facility]) - Progression Note    Patient Details  Name: ADELEINE PASK MRN: [id number] Date of Birth: [birth year]  Transition of Care [facility]) CM/SW Contact  Maud Deed, LCSW Phone Number:[phone] 05/15/2019, 11:55 AM  Clinical Narrative:    Pt has changed her mind and does not want to go back to [facility] and stated that she she would like to go to [facility]. CSW faxed out to [facility] and will notify if she is accepted.  TOC will continue to follow for discharge planning needs.        Expected Discharge Plan and Services                                                 Social Determinants of Health (SDOH) Interventions    Readmission Risk Interventions No flowsheet data found.

## 2019-05-15 NOTE — Progress Notes (Signed)
   Vital Signs MEWS/VS Documentation      05/15/2019 0023 05/15/2019 0704 05/15/2019 0802 05/15/2019 0808   MEWS Score:  1  2  2  2    MEWS Score Color:  Green  Yellow  Yellow  Yellow   Resp:  18  --  15  --   Pulse:  94  --  (!) 103  --   BP:  97/61  --  (!) 86/42  --   Temp:  98 F (36.7 C)  --  98.7 F (37.1 C)  --   O2 Device:  Nasal Cannula  --  Nasal Cannula  --   O2 Flow Rate (L/min):  4 L/min  --  4 L/min  --   Level of Consciousness:  --  --  Alert  Alert      Patient continues as yellow MEWS r/t BP and HR. No new changes at this time.       [name] 05/15/2019,8:16 AM

## 2019-05-15 NOTE — Progress Notes (Signed)
PROGRESS NOTE    Melanie Berry  FYB:[id number] DOB: 1965/09/12 DOA: 05/11/2019 PCP: Derwood Kaplan, MD       Assessment & Plan:   Principal Problem:   Fracture of femoral neck, right, closed (HCC) Active Problems:   CHARCOT-MARIE-TOOTH DISEASE   Venous ulcers of both lower extremities (HCC)   Sepsis (HCC)   Fall at home, initial encounter   Cellulitis of right leg   Obesity, Class III, BMI 40-49.9 (morbid obesity) (HCC)   Closed right hip fracture (HCC)   COPD (chronic obstructive pulmonary disease) (HCC)   Goals of care, counseling/discussion   Palliative care by specialist   Right fracture of femoral neck: secondary to fall at home. No surgical intervention as per ortho surg. Patient is nonambulatory, needs assist with all ADLs.  Sepsis: etiology unclear, cellulitis of RLE. Venous stasis ulcer bilateral lower extremity. Wound care recs apprec. Continue on IV rocephin. Blood cxs NGTD  Charcot-Marie-Tooth Disease: w/ BMI 40-49.9, morbid obesity. Patient is under hospice care, with progressive decline  Pain syndrome: continue on home dose of mobic, MS contin, p.o. morphine  COPD: continue w/ bronchodilators. Continue on supplemental oxygen and wean as tolerated   Depression & anxiety: severity unknown. Continue on home dose of abilify, trazodone, effexor, klonopin, depakote    DVT prophylaxis: lovenox Code Status: DNR Family Communication: discussed pt's care w/ pt's husband and answered his questions Disposition Plan: pt is requesting to go to [facility]:   Ortho surg  Palliative care   Procedures:   Antimicrobials: rocephin   Subjective: Pt c/o not being able to see family and wanting to be near her family.  Objective: Vitals:   05/14/19 1841 05/14/19 2026 05/14/19 2144 05/15/19 0023  BP: 95/61 93/61 101/63 97/61  Pulse: (!) 112 100 100 94  Resp: 18 18 18 18   Temp: 99.5 F (37.5 C) 98.7 F (37.1 C) 98.8 F (37.1 C) 98 F  (36.7 C)  TempSrc: Oral Oral Oral Oral  SpO2: 92% 94% 96% 95%  Weight:      Height:        Intake/Output Summary (Last 24 hours) at 05/15/2019 0721 Last data filed at 05/15/2019 0300 Gross per 24 hour  Intake 120 ml  Output 1075 ml  Net -955 ml   Filed Weights   05/11/19 1948  Weight: 113.4 kg    Examination:  General exam: Appears anxious Respiratory system: diminished breath sounds b/l Cardiovascular system: S1 & S2 +. No rubs, gallops or clicks. B/l LE edema Gastrointestinal system: Abdomen is nondistended, soft and nontender. Normal bowel sounds heard. Central nervous system: Alert and oriented.  Psychiatry: Judgement and insight appear normal. Tearful     Data Reviewed: I have personally reviewed following labs and imaging studies  CBC: Recent Labs  Lab 05/11/19 2117 05/12/19 0103 05/13/19 0441 05/14/19 0626 05/15/19 0434  WBC 12.4* 11.9* 8.9 8.6 8.4  NEUTROABS 9.6*  --   --   --   --   HGB 11.4* 9.7* 8.7* 8.0* 7.6*  HCT 37.9 31.6* 28.7* 25.4* 24.8*  MCV 89.2 88.3 88.6 88.5 88.6  PLT 411* 350 258 215 218   Basic Metabolic Panel: Recent Labs  Lab 05/11/19 2117 05/12/19 0103 05/13/19 0441 05/14/19 0626 05/15/19 0434  NA 140 140 140 139 141  K 4.2 3.9 3.2* 3.0* 3.4*  CL 98 99 103 100 100  CO2 31 32 30 32 32  GLUCOSE 118* 118* 91 105* 94  BUN 13 13  10 11 13   CREATININE 0.54 0.50 0.45 0.46 0.34*  CALCIUM 9.0 8.3* 8.1* 8.0* 8.3*   GFR: Estimated Creatinine Clearance: 98.1 mL/min (A) (by C-G formula based on SCr of 0.34 mg/dL (L)). Liver Function Tests: Recent Labs  Lab 05/11/19 2117  AST 33  ALT 19  ALKPHOS 86  BILITOT 0.7  PROT 7.0  ALBUMIN 3.4*   No results for input(s): LIPASE, AMYLASE in the last 168 hours. No results for input(s): AMMONIA in the last 168 hours. Coagulation Profile: Recent Labs  Lab 05/11/19 2117  INR 1.0   Cardiac Enzymes: No results for input(s): CKTOTAL, CKMB, CKMBINDEX, TROPONINI in the last 168 hours. BNP  (last 3 results) No results for input(s): PROBNP in the last 8760 hours. HbA1C: No results for input(s): HGBA1C in the last 72 hours. CBG: No results for input(s): GLUCAP in the last 168 hours. Lipid Profile: No results for input(s): CHOL, HDL, LDLCALC, TRIG, CHOLHDL, LDLDIRECT in the last 72 hours. Thyroid Function Tests: No results for input(s): TSH, T4TOTAL, FREET4, T3FREE, THYROIDAB in the last 72 hours. Anemia Panel: No results for input(s): VITAMINB12, FOLATE, FERRITIN, TIBC, IRON, RETICCTPCT in the last 72 hours. Sepsis Labs: Recent Labs  Lab 05/11/19 2117 05/12/19 0103 05/12/19 0903  PROCALCITON <0.10  --   --   LATICACIDVEN 2.7* 2.1* 0.9    Recent Results (from the past 240 hour(s))  Urine culture     Status: None   Collection Time: 05/11/19  8:53 PM   Specimen: In/Out Cath Urine  Result Value Ref Range Status   Specimen Description   Final    IN/OUT CATH URINE Performed at [facility], [address], [place], [place]    Special Requests   Final    NONE Performed at [facility], [address], [place], [place]    Culture   Final    NO GROWTH Performed at [facility], [address], [place], [place]    Report Status 05/12/2019 FINAL  Final  Blood Culture (routine x 2)     Status: None (Preliminary result)   Collection Time: 05/11/19  9:17 PM   Specimen: BLOOD  Result Value Ref Range Status   Specimen Description BLOOD LEFT ANTECUBITAL  Final   Special Requests   Final    BOTTLES DRAWN AEROBIC AND ANAEROBIC Blood Culture adequate volume   Culture   Final    NO GROWTH 4 DAYS Performed at [facility], [address], [place], [place]    Report Status PENDING  Incomplete  Blood Culture (routine x 2)     Status: None (Preliminary result)   Collection Time: 05/11/19  9:17 PM   Specimen: BLOOD  Result Value Ref Range Status   Specimen Description BLOOD LEFT ANTECUBITAL  Final    Special Requests   Final    BOTTLES DRAWN AEROBIC AND ANAEROBIC Blood Culture adequate volume   Culture   Final    NO GROWTH 4 DAYS Performed at [facility], [address] [place], [place]    Report Status PENDING  Incomplete  SARS CORONAVIRUS 2 (TAT 6-24 HRS) Nasopharyngeal Nasopharyngeal Swab     Status: None   Collection Time: 05/11/19 10:31 PM   Specimen: Nasopharyngeal Swab  Result Value Ref Range Status   SARS Coronavirus 2 NEGATIVE NEGATIVE Final    Comment: (NOTE) SARS-CoV-2 target nucleic acids are NOT DETECTED. The SARS-CoV-2 RNA is generally detectable in upper and  lower respiratory specimens during the acute phase of infection. Negative results do not preclude SARS-CoV-2 infection, do not rule out co-infections with other pathogens, and should not be used as the sole basis for treatment or other patient management decisions. Negative results must be combined with clinical observations, patient history, and epidemiological information. The expected result is Negative. Fact Sheet for Patients: SugarRoll.be Fact Sheet for Healthcare Providers: https://www.woods-mathews.com/ This test is not yet approved or cleared by the [place] FDA and  has been authorized for detection and/or diagnosis of SARS-CoV-2 by FDA under an Emergency Use Authorization (EUA). This EUA will remain  in effect (meaning this test can be used) for the duration of the COVID-19 declaration under Section 56 4(b)(1) of the Act, 21 U.S.C. section 360bbb-3(b)(1), unless the authorization is terminated or revoked sooner. Performed at [facility], [place] [address], [place], [place] [address]   MRSA PCR Screening     Status: Abnormal   Collection Time: 05/13/19  9:34 AM   Specimen: Nasopharyngeal  Result Value Ref Range Status   MRSA by PCR POSITIVE (A) NEGATIVE Final    Comment:        The GeneXpert MRSA Assay (FDA approved for  NASAL specimens only), is one component of a comprehensive MRSA colonization surveillance program. It is not intended to diagnose MRSA infection nor to guide or monitor treatment for MRSA infections. RESULT CALLED TO, READ BACK BY AND VERIFIED WITH: SARA BARBER AT 1109 ON 05/13/2019 [place]. Performed at [facility], [address] [place], [place] [address]          Radiology Studies: No results found.      Scheduled Meds: . acidophilus  2 capsule Oral TID  . ammonium lactate  1 application Topical BID  . ARIPiprazole  12 mg Oral Daily  . azelastine  2 spray Each Nare BID  . carvedilol  6.25 mg Oral BID WC  . Chlorhexidine Gluconate Cloth  6 each Topical Q0600  . divalproex  125 mg Oral BID  . enoxaparin (LOVENOX) injection  40 mg Subcutaneous Q12H  . feeding supplement (ENSURE ENLIVE)  237 mL Oral BID BM  . Gerhardt's butt cream   Topical BID  . lubiprostone  24 mcg Oral Q breakfast  . morphine  30 mg Oral Q12H  . mupirocin ointment   Nasal BID  . senna  2 tablet Oral BID  . traZODone  50 mg Oral QHS  . venlafaxine XR  75 mg Oral Q breakfast   Continuous Infusions: . sodium chloride 50 mL/hr at 05/13/19 0829  . cefTRIAXone (ROCEPHIN)  IV 2 g (05/15/19 0547)     LOS: 3 days    Time spent: 33 mins    Wyvonnia Dusky, MD [facility] Pager [phone]  If 7PM-7AM, please contact night-coverage [email or web address] 05/15/2019, 7:21 AM

## 2019-05-16 LAB — BASIC METABOLIC PANEL
Anion gap: 5 (ref 5–15)
BUN: 11 mg/dL (ref 6–20)
CO2: 32 mmol/L (ref 22–32)
Calcium: 8.1 mg/dL — ABNORMAL LOW (ref 8.9–10.3)
Chloride: 102 mmol/L (ref 98–111)
Creatinine, Ser: <0.3 mg/dL — ABNORMAL LOW (ref 0.44–1.00)
Glucose, Bld: 87 mg/dL (ref 70–99)
Potassium: 3.5 mmol/L (ref 3.5–5.1)
Sodium: 139 mmol/L (ref 135–145)

## 2019-05-16 LAB — CULTURE, BLOOD (ROUTINE X 2)
Culture: NO GROWTH
Culture: NO GROWTH
Special Requests: ADEQUATE
Special Requests: ADEQUATE

## 2019-05-16 LAB — CBC
HCT: 22.9 % — ABNORMAL LOW (ref 36.0–46.0)
Hemoglobin: 7.3 g/dL — ABNORMAL LOW (ref 12.0–15.0)
MCH: 28 pg (ref 26.0–34.0)
MCHC: 31.9 g/dL (ref 30.0–36.0)
MCV: 87.7 fL (ref 80.0–100.0)
Platelets: 199 10*3/uL (ref 150–400)
RBC: 2.61 MIL/uL — ABNORMAL LOW (ref 3.87–5.11)
RDW: 16.8 % — ABNORMAL HIGH (ref 11.5–15.5)
WBC: 7 10*3/uL (ref 4.0–10.5)
nRBC: 0 % (ref 0.0–0.2)

## 2019-05-16 NOTE — Progress Notes (Signed)
PROGRESS NOTE    Melanie Berry  JAS:[id number] DOB: [birth year] DOA: 05/11/2019 PCP: Derwood Kaplan, MD       Assessment & Plan:   Principal Problem:   Fracture of femoral neck, right, closed (HCC) Active Problems:   CHARCOT-MARIE-TOOTH DISEASE   Venous ulcers of both lower extremities (HCC)   Sepsis (HCC)   Fall at home, initial encounter   Cellulitis of right leg   Obesity, Class III, BMI 40-49.9 (morbid obesity) (HCC)   Closed right hip fracture (HCC)   COPD (chronic obstructive pulmonary disease) (HCC)   Goals of care, counseling/discussion   Palliative care by specialist   Right fracture of femoral neck: secondary to fall at home. No surgical intervention as per ortho surg. Patient is nonambulatory, needs assist with all ADLs.  Sepsis: etiology unclear, cellulitis of RLE. Venous stasis ulcer bilateral lower extremity. Wound care recs apprec. Continue on IV rocephin. Blood cxs NGTD  Charcot-Marie-Tooth Disease: w/ BMI 40-49.9, morbid obesity. Patient is under hospice care, with progressive decline  Pain syndrome: continue on home dose of mobic, MS contin, p.o. morphine  COPD: continue w/ bronchodilators. Continue on supplemental oxygen and wean as tolerated   Depression & anxiety: severity unknown. Continue on home dose of abilify, trazodone, effexor, klonopin, depakote    DVT prophylaxis: lovenox Code Status: DNR Family Communication: discussed pt's care w/ pt's husband and answered his questions Disposition Plan: CM is working to see if pt can be d/c to [facility]:   Ortho surg  Palliative care   Procedures:   Antimicrobials: rocephin   Subjective: Pt c/o fatigue  Objective: Vitals:   05/15/19 1955 05/15/19 2215 05/16/19 0051 05/16/19 0811  BP:  101/66 106/61 107/66  Pulse:   (!) 114 (!) 106  Resp:   17 18  Temp:  99.8 F (37.7 C) 98.3 F (36.8 C) 98.9 F (37.2 C)  TempSrc:  Oral  Oral  SpO2: 95%  91% 99%  Weight:       Height:        Intake/Output Summary (Last 24 hours) at 05/16/2019 1301 Last data filed at 05/16/2019 1019 Gross per 24 hour  Intake 460 ml  Output 1000 ml  Net -540 ml   Filed Weights   05/11/19 1948  Weight: 113.4 kg    Examination:  General exam: Appears calm & comfortable. Morbidly obese Respiratory system: decreased breath sounds b/l Cardiovascular system: S1 & S2 +. No rubs, gallops or clicks. B/l LE edema Gastrointestinal system: Abdomen is obese, soft and nontender. Hypoactive bowel sounds heard. Central nervous system: Alert and oriented.  Psychiatry: Judgement and insight appear normal. Flat mood and affect     Data Reviewed: I have personally reviewed following labs and imaging studies  CBC: Recent Labs  Lab 05/11/19 2117 05/11/19 2117 05/12/19 0103 05/13/19 0441 05/14/19 0626 05/15/19 0434 05/16/19 0647  WBC 12.4*   < > 11.9* 8.9 8.6 8.4 7.0  NEUTROABS 9.6*  --   --   --   --   --   --   HGB 11.4*   < > 9.7* 8.7* 8.0* 7.6* 7.3*  HCT 37.9   < > 31.6* 28.7* 25.4* 24.8* 22.9*  MCV 89.2   < > 88.3 88.6 88.5 88.6 87.7  PLT 411*   < > 350 258 215 218 199   < > = values in this interval not displayed.   Basic Metabolic Panel: Recent Labs  Lab 05/12/19 0103 05/13/19 0441  05/14/19 0626 05/15/19 0434 05/16/19 0647  NA 140 140 139 141 139  K 3.9 3.2* 3.0* 3.4* 3.5  CL 99 103 100 100 102  CO2 32 30 32 32 32  GLUCOSE 118* 91 105* 94 87  BUN 13 10 11 13 11   CREATININE 0.50 0.45 0.46 0.34* <0.30*  CALCIUM 8.3* 8.1* 8.0* 8.3* 8.1*   GFR: CrCl cannot be calculated (This lab value cannot be used to calculate CrCl because it is not a number: <0.30). Liver Function Tests: Recent Labs  Lab 05/11/19 2117  AST 33  ALT 19  ALKPHOS 86  BILITOT 0.7  PROT 7.0  ALBUMIN 3.4*   No results for input(s): LIPASE, AMYLASE in the last 168 hours. No results for input(s): AMMONIA in the last 168 hours. Coagulation Profile: Recent Labs  Lab 05/11/19 2117    INR 1.0   Cardiac Enzymes: No results for input(s): CKTOTAL, CKMB, CKMBINDEX, TROPONINI in the last 168 hours. BNP (last 3 results) No results for input(s): PROBNP in the last 8760 hours. HbA1C: No results for input(s): HGBA1C in the last 72 hours. CBG: No results for input(s): GLUCAP in the last 168 hours. Lipid Profile: No results for input(s): CHOL, HDL, LDLCALC, TRIG, CHOLHDL, LDLDIRECT in the last 72 hours. Thyroid Function Tests: No results for input(s): TSH, T4TOTAL, FREET4, T3FREE, THYROIDAB in the last 72 hours. Anemia Panel: No results for input(s): VITAMINB12, FOLATE, FERRITIN, TIBC, IRON, RETICCTPCT in the last 72 hours. Sepsis Labs: Recent Labs  Lab 05/11/19 2117 05/12/19 0103 05/12/19 0903  PROCALCITON <0.10  --   --   LATICACIDVEN 2.7* 2.1* 0.9    Recent Results (from the past 240 hour(s))  Urine culture     Status: None   Collection Time: 05/11/19  8:53 PM   Specimen: In/Out Cath Urine  Result Value Ref Range Status   Specimen Description   Final    IN/OUT CATH URINE Performed at [facility], [address] [place], [place]    Special Requests   Final    NONE Performed at [facility], [address], [place], [place]    Culture   Final    NO GROWTH Performed at [facility], [address], [place], [place]    Report Status 05/12/2019 FINAL  Final  Blood Culture (routine x 2)     Status: None   Collection Time: 05/11/19  9:17 PM   Specimen: BLOOD  Result Value Ref Range Status   Specimen Description BLOOD LEFT ANTECUBITAL  Final   Special Requests   Final    BOTTLES DRAWN AEROBIC AND ANAEROBIC Blood Culture adequate volume   Culture   Final    NO GROWTH 5 DAYS Performed at [facility], [address], [place], [place]    Report Status 05/16/2019 FINAL  Final  Blood Culture (routine x 2)     Status: None   Collection Time: 05/11/19  9:17 PM   Specimen: BLOOD   Result Value Ref Range Status   Specimen Description BLOOD LEFT ANTECUBITAL  Final   Special Requests   Final    BOTTLES DRAWN AEROBIC AND ANAEROBIC Blood Culture adequate volume   Culture   Final    NO GROWTH 5 DAYS Performed at [facility], [address] [place], [place]    Report Status 05/16/2019 FINAL  Final  SARS CORONAVIRUS 2 (TAT 6-24 HRS) Nasopharyngeal Nasopharyngeal Swab     Status:  None   Collection Time: 05/11/19 10:31 PM   Specimen: Nasopharyngeal Swab  Result Value Ref Range Status   SARS Coronavirus 2 NEGATIVE NEGATIVE Final    Comment: (NOTE) SARS-CoV-2 target nucleic acids are NOT DETECTED. The SARS-CoV-2 RNA is generally detectable in upper and lower respiratory specimens during the acute phase of infection. Negative results do not preclude SARS-CoV-2 infection, do not rule out co-infections with other pathogens, and should not be used as the sole basis for treatment or other patient management decisions. Negative results must be combined with clinical observations, patient history, and epidemiological information. The expected result is Negative. Fact Sheet for Patients: SugarRoll.be Fact Sheet for Healthcare Providers: https://www.woods-mathews.com/ This test is not yet approved or cleared by the [place] FDA and  has been authorized for detection and/or diagnosis of SARS-CoV-2 by FDA under an Emergency Use Authorization (EUA). This EUA will remain  in effect (meaning this test can be used) for the duration of the COVID-19 declaration under Section 56 4(b)(1) of the Act, 21 U.S.C. section 360bbb-3(b)(1), unless the authorization is terminated or revoked sooner. Performed at [facility], [place] [address], [place], [place] [address]   MRSA PCR Screening     Status: Abnormal   Collection Time: 05/13/19  9:34 AM   Specimen: Nasopharyngeal  Result Value Ref Range Status   MRSA by PCR  POSITIVE (A) NEGATIVE Final    Comment:        The GeneXpert MRSA Assay (FDA approved for NASAL specimens only), is one component of a comprehensive MRSA colonization surveillance program. It is not intended to diagnose MRSA infection nor to guide or monitor treatment for MRSA infections. RESULT CALLED TO, READ BACK BY AND VERIFIED WITH: SARA BARBER AT 1109 ON 05/13/2019 [place]. Performed at [facility], [address] [place], [place] [address]          Radiology Studies: No results found.      Scheduled Meds: . acidophilus  2 capsule Oral TID  . ammonium lactate  1 application Topical BID  . ARIPiprazole  12 mg Oral Daily  . azelastine  2 spray Each Nare BID  . carvedilol  6.25 mg Oral BID WC  . Chlorhexidine Gluconate Cloth  6 each Topical Q0600  . divalproex  125 mg Oral BID  . enoxaparin (LOVENOX) injection  40 mg Subcutaneous Q12H  . feeding supplement (ENSURE ENLIVE)  237 mL Oral BID BM  . Gerhardt's butt cream   Topical BID  . lubiprostone  24 mcg Oral Q breakfast  . morphine  30 mg Oral Q12H  . mupirocin ointment   Nasal BID  . senna  2 tablet Oral BID  . traZODone  50 mg Oral QHS  . venlafaxine XR  75 mg Oral Q breakfast   Continuous Infusions: . sodium chloride 50 mL/hr at 05/13/19 0829  . cefTRIAXone (ROCEPHIN)  IV Stopped (05/15/19 4627)     LOS: 4 days    Time spent: 30 mins    Wyvonnia Dusky, MD [facility] Pager [phone]  If 7PM-7AM, please contact night-coverage [email or web address] 05/16/2019, 1:01 PM

## 2019-05-17 DIAGNOSIS — G600 Hereditary motor and sensory neuropathy: Secondary | ICD-10-CM

## 2019-05-17 LAB — BASIC METABOLIC PANEL
Anion gap: 9 (ref 5–15)
BUN: 10 mg/dL (ref 6–20)
CO2: 31 mmol/L (ref 22–32)
Calcium: 8.4 mg/dL — ABNORMAL LOW (ref 8.9–10.3)
Chloride: 100 mmol/L (ref 98–111)
Creatinine, Ser: 0.41 mg/dL — ABNORMAL LOW (ref 0.44–1.00)
GFR calc Af Amer: >60 mL/min (ref >60)
GFR calc non Af Amer: >60 mL/min (ref >60)
Glucose, Bld: 86 mg/dL (ref 70–99)
Potassium: 3.3 mmol/L — ABNORMAL LOW (ref 3.5–5.1)
Sodium: 140 mmol/L (ref 135–145)

## 2019-05-17 LAB — CBC
HCT: 24.1 % — ABNORMAL LOW (ref 36.0–46.0)
Hemoglobin: 7.5 g/dL — ABNORMAL LOW (ref 12.0–15.0)
MCH: 27.3 pg (ref 26.0–34.0)
MCHC: 31.1 g/dL (ref 30.0–36.0)
MCV: 87.6 fL (ref 80.0–100.0)
Platelets: 253 10*3/uL (ref 150–400)
RBC: 2.75 MIL/uL — ABNORMAL LOW (ref 3.87–5.11)
RDW: 16.7 % — ABNORMAL HIGH (ref 11.5–15.5)
WBC: 8.2 10*3/uL (ref 4.0–10.5)
nRBC: 0 % (ref 0.0–0.2)

## 2019-05-17 MED ORDER — POTASSIUM CHLORIDE CRYS ER 20 MEQ PO TBCR
40.0000 meq | EXTENDED_RELEASE_TABLET | Freq: Once | ORAL | Status: AC
  Administered 2019-05-17: 40 meq via ORAL
  Filled 2019-05-17: qty 2

## 2019-05-17 MED ORDER — CEFDINIR 300 MG PO CAPS: 300.0000 mg | ORAL_CAPSULE | Freq: Two times a day (BID) | ORAL | 0 refills | Status: AC

## 2019-05-17 NOTE — TOC Progression Note (Signed)
Transition of Care [facility]) - Progression Note    Patient Details  Name: Melanie Berry MRN: [id number] Date of Birth: 16-Dec-1965  Transition of Care [facility] CM/SW Contact  Bienvenido Proehl, Lemar Livings, LCSW Phone Number: 05/17/2019, 8:55 AM  Clinical Narrative:    MD reports pt ready to return to [facility], have contacted [facility] and will work on trasnfer back to facility. Pt is long term pt there.         Expected Discharge Plan and Services                                                 Social Determinants of Health (SDOH) Interventions    Readmission Risk Interventions No flowsheet data found.

## 2019-05-17 NOTE — Discharge Summary (Signed)
Physician Discharge Summary  Melanie Berry SNK:[id number] DOB: 11-05-1965 DOA: 05/11/2019  PCP: Marden Noble, MD  Admit date: 05/11/2019 Discharge date: 05/17/2019  Admitted From: [facility]  Disposition: [place]   Recommendations for Outpatient Follow-up:  1. Follow up with palliative/hospice care in 1   Home Health: no Equipment/Devices:  Discharge Condition: guarded  CODE STATUS: DNR Diet recommendation: Regular  Brief/Interim Summary: HPI was taken from Dr. Damita Dunnings: Melanie Berry is a 54 y.o. female with medical history significant for morbid obesity, COPD, chronic lower extremity venous stasis with venous stasis ulcers, Charcot's arthropathy  sent from her facility after sustaining a witnessed fall.  She was unable to get up due to severe right leg pain.  History limited as patient is very drowsy, possibly from pain med administered in the emergency room  ED Course: She was febrile at 100.6 and tachycardic at 128.  See 12,000, lactic acid 2.7.  Urinalysis unremarkable, procalcitonin less than 0.10, chest x-ray active disease.  She was noted to have wounds on her legs and redness concerning for cellulitis.  X-ray of the hip showed femoral neck fracture on the right.  The emergency room provider spoke with Dr. Rudene Christians.  Patient met sepsis criteria and was started on antibiotics and fluid for sepsis from cellulitis.  Hospitalist consulted for admission.  Hospital Course from Dr. Lenise Herald 05/15/19-05/17/19: Pt was found to have right femoral neck fracture secondary to a fall at home facility. Ortho surg was consulted and no surgical intervention as per ortho. Of note, pt was found to be septic secondary to likely cellulitis of RLE. Pt received IV rocephin while inpatient and will be d/c on po cefdinir to finish the course.    Discharge Diagnoses:  Principal Problem:   Fracture of femoral neck, right, closed ([place]) Active Problems:   CHARCOT-MARIE-TOOTH DISEASE    Venous ulcers of both lower extremities ([place])   Sepsis ([place])   Fall at home, initial encounter   Cellulitis of right leg   Obesity, Class III, BMI 40-49.9 (morbid obesity) ([place])   Closed right hip fracture (H[facility])   COPD (chronic obstructive pulmonary disease) (H[facility])   Goals of care, counseling/discussion   Palliative care by specialist   Right fracture of femoral neck: secondary to fall at home. No surgical intervention as per ortho surg. Patient is nonambulatory, needs assist with all ADLs. Continue w/ lovenox for DVT prophylaxis   Sepsis: etiology unclear, cellulitis of RLE. Venous stasis ulcer bilateral lower extremity. Wound care recs apprec. Continue on IV rocephin while inpatient and transition to po cefdinir at d/c. Blood cxs NGTD  Charcot-Marie-Tooth Disease: w/ BMI 40-49.9, morbid obesity. Patient is under hospice care, with progressive decline  Pain syndrome: continue on home dose of mobic, MS contin, p.o. morphine  COPD: continue w/ bronchodilators. Continue on supplemental oxygen and wean as tolerated   Depression & anxiety: severity unknown. Continue on home dose of abilify, trazodone, effexor, klonopin, depakote  Hypokalemia: KCl repleted. Will continue to monitor   Discharge Instructions  Discharge Instructions    Diet - low sodium heart healthy   Complete by: As directed    Discharge instructions   Complete by: As directed    F/U palliative/hospice care in 1 day   Increase activity slowly   Complete by: As directed      Allergies as of 05/17/2019      Reactions   Pregabalin Swelling   Shellfish-derived Products Swelling   Aspirin Other (See Comments)  REACTION: Unknown reaction   Clarithromycin Other (See Comments)   REACTION: Unkown reaction   Codeine Other (See Comments)   Unknown reaction   Contrast Media [iodinated Diagnostic Agents] Other (See Comments)   unknown   Fentanyl Other (See Comments)   REACTION: Unknown reaction   Gabapentin Other  (See Comments)   REACTION: Unknown reaction   Methocarbamol Other (See Comments)   REACTION: Unknown reaction   Nicotine Other (See Comments)   REACTION: Allergic to patch only Unknown reaction   Nortriptyline Hcl Other (See Comments)   REACTION: Unknown reaction   Nortriptyline Hcl Other (See Comments)   Other reaction(s): Unknown REACTION: Unknown reaction   Potassium Iodide Other (See Comments)   REACTION: Unknown reaction   Propoxyphene N-acetaminophen Other (See Comments)   REACTION: Unknown reaction   Sulfonamide Derivatives Other (See Comments)   REACTION: Unknown reaction   Tramadol Hcl Other (See Comments)   REACTION: Severe headaches      Medication List    TAKE these medications   acetaminophen 325 MG tablet Commonly known as: TYLENOL Take 650 mg by mouth every 4 (four) hours as needed for mild pain or moderate pain.   ammonium lactate 12 % cream Commonly known as: AMLACTIN Apply 1 g topically 2 (two) times daily. (apply to feet)   ARIPiprazole 2 MG tablet Commonly known as: ABILIFY Take 12 mg by mouth daily.   azelastine 0.1 % nasal spray Commonly known as: ASTELIN Place 2 sprays into both nostrils 2 (two) times daily. Use in each nostril as directed   BIOFREEZE EX Apply 1 application topically every 8 (eight) hours as needed. (apply to left shoulder)   cefdinir 300 MG capsule Commonly known as: OMNICEF Take 1 capsule (300 mg total) by mouth 2 (two) times daily for 2 days.   ciclopirox 0.77 % cream Commonly known as: LOPROX Apply topically 2 (two) times daily. Apply bilateral toenails topically every day shift for toenail fungus   clonazePAM 0.5 MG tablet Commonly known as: KLONOPIN Take 1 mg by mouth 3 (three) times daily.   clonazePAM 0.5 MG tablet Commonly known as: KLONOPIN Take 0.5 mg by mouth daily as needed for anxiety.   divalproex 125 MG DR tablet Commonly known as: DEPAKOTE Take 125 mg by mouth 2 (two) times daily.   fluticasone 50  MCG/ACT nasal spray Commonly known as: FLONASE Place 1 spray into both nostrils daily.   furosemide 20 MG tablet Commonly known as: LASIX Take 30 mg by mouth 2 (two) times daily.   lubiprostone 24 MCG capsule Commonly known as: AMITIZA Take 24 mcg by mouth daily with breakfast.   melatonin 5 MG Tabs Take 10 mg by mouth at bedtime.   meloxicam 7.5 MG tablet Commonly known as: MOBIC Take 7.5 mg by mouth daily.   menthol-cetylpyridinium 3 MG lozenge Commonly known as: CEPACOL Take 1 lozenge by mouth every hour as needed for sore throat.   miconazole nitrate Powd Commonly known as: MICATIN Apply 1 application topically 2 (two) times daily. (apply under breasts and to armpits)   morphine CONCENTRATE 10 mg / 0.5 ml concentrated solution Take 10 mg by mouth every 2 (two) hours as needed for severe pain or shortness of breath.   morphine 30 MG 12 hr tablet Commonly known as: MS CONTIN Take 30 mg by mouth every 12 (twelve) hours.   NATURES TEARS OP Place 1 drop into both eyes as needed (dry eys). (0.4%)   ondansetron 4 MG tablet Commonly known as:  ZOFRAN Take 4 mg by mouth every 8 (eight) hours as needed for nausea or vomiting.   potassium chloride SA 20 MEQ tablet Commonly known as: KLOR-CON Take 40 mEq by mouth daily.   senna 8.6 MG tablet Commonly known as: SENOKOT Take 2 tablets by mouth 2 (two) times daily.   sodium chloride 0.65 % Soln nasal spray Commonly known as: OCEAN Place 2 sprays into both nostrils 3 (three) times daily.   traZODone 50 MG tablet Commonly known as: DESYREL Take 50 mg by mouth at bedtime.   venlafaxine XR 150 MG 24 hr capsule Commonly known as: EFFEXOR-XR Take 150 mg by mouth daily with breakfast.      Contact information for after-discharge care    [facility] .   Service: Skilled Nursing Contact information: [place] [phone]              Allergies  Allergen Reactions  . Pregabalin Swelling  . Shellfish-Derived Products Swelling  . Aspirin Other (See Comments)    REACTION: Unknown reaction  . Clarithromycin Other (See Comments)    REACTION: Unkown reaction  . Codeine Other (See Comments)    Unknown reaction  . Contrast Media [Iodinated Diagnostic Agents] Other (See Comments)    unknown  . Fentanyl Other (See Comments)    REACTION: Unknown reaction  . Gabapentin Other (See Comments)    REACTION: Unknown reaction  . Methocarbamol Other (See Comments)    REACTION: Unknown reaction  . Nicotine Other (See Comments)    REACTION: Allergic to patch only Unknown reaction  . Nortriptyline Hcl Other (See Comments)    REACTION: Unknown reaction  . Nortriptyline Hcl Other (See Comments)    Other reaction(s): Unknown REACTION: Unknown reaction  . Potassium Iodide Other (See Comments)    REACTION: Unknown reaction  . Propoxyphene N-Acetaminophen Other (See Comments)    REACTION: Unknown reaction  . Sulfonamide Derivatives Other (See Comments)    REACTION: Unknown reaction  . Tramadol Hcl Other (See Comments)    REACTION: Severe headaches    Consultations:  Ortho surg    Procedures/Studies: DG Chest 1 View  Result Date: 05/13/2019 CLINICAL DATA:  Ongoing tachycardia EXAM: CHEST  1 VIEW COMPARISON:  May 11, 2019 FINDINGS: The heart size and mediastinal contours are unchanged. There is prominence of the central pulmonary vasculature with mildly increased interstitial markings throughout both lungs. No pleural effusion is seen. Elevation of the right hemidiaphragm again noted. Surgical clips in the right upper quadrant. IMPRESSION: Pulmonary vascular congestion and interstitial edema. Electronically Signed   By: Prudencio Pair M.D.   On: 05/13/2019 06:51   DG Chest 1 View  Result Date: 05/11/2019 CLINICAL DATA:  Right hip and leg pain EXAM: CHEST  1 VIEW COMPARISON:  11/15/2015 FINDINGS: Chronic elevation of the right  hemidiaphragm. Low lung volumes. No confluent opacities. Heart is normal size. No effusions. No acute bony abnormality. IMPRESSION: Chronic elevation of the right hemidiaphragm. Low lung volumes. No active disease. Electronically Signed   By: Rolm Baptise M.D.   On: 05/11/2019 20:26   DG Pelvis 1-2 Views  Result Date: 05/11/2019 CLINICAL DATA:  Unwitnessed fall EXAM: PELVIS - 1-2 VIEW COMPARISON:  06/28/2009 FINDINGS: There is a right femoral neck fracture with varus angulation and foreshortening. No subluxation or dislocation. Degenerative changes in the hips. IMPRESSION: Right femoral neck fracture with varus angulation. Electronically Signed   By: Rolm Baptise M.D.  On: 05/11/2019 20:27   CT Head Wo Contrast  Result Date: 05/11/2019 CLINICAL DATA:  54 year old female with trauma. EXAM: CT HEAD WITHOUT CONTRAST TECHNIQUE: Contiguous axial images were obtained from the base of the skull through the vertex without intravenous contrast. COMPARISON:  Head CT dated 03/11/2013. FINDINGS: Brain: Mild age-related atrophy and chronic microvascular ischemic changes. There is no acute intracranial hemorrhage. No mass effect or midline shift. No extra-axial fluid collection. Vascular: No hyperdense vessel or unexpected calcification. Skull: Normal. Negative for fracture or focal lesion. Sinuses/Orbits: Mild diffuse mucoperiosteal thickening of paranasal sinuses and postsurgical changes of the maxillary sinus consistent with chronic sinusitis. No air-fluid level. Partial opacification of the right mastoid air cells. The left mastoid air cells are clear. Cerumen noted in the left external auditory canal. Other: None IMPRESSION: 1. No acute intracranial pathology. 2. Mild age-related atrophy and chronic microvascular ischemic changes. 3. Chronic paranasal sinus disease. Electronically Signed   By: Anner Crete M.D.   On: 05/11/2019 20:08   DG Femur Min 2 Views Right  Result Date: 05/11/2019 CLINICAL DATA:   54 year old female with fall and right hip pain. Evaluate for fracture. EXAM: RIGHT FEMUR 2 VIEWS COMPARISON:  CT abdomen pelvis dated 12/10/2015. FINDINGS: There is a mildly displaced fracture of the right femoral neck. There is an age indeterminate depressed fracture of the lateral tibial plateau, possibly subacute or acute. Dedicated radiograph of the knee is recommended for better evaluation. Evaluation for fracture is very limited due to body habitus and soft tissue attenuation and advanced osteopenia. No dislocation. There is a moderate-sized suprapatellar effusion. There is mild diffuse subcutaneous edema. IMPRESSION: 1. Minimally displaced fracture of the right femoral neck. 2. Age indeterminate, possibly acute or subacute depressed fracture of the lateral tibial plateau. Dedicated radiograph of the knee is recommended for better evaluation. Electronically Signed   By: Anner Crete M.D.   On: 05/11/2019 20:26     Subjective: Pt c/o b/l LE pain    Discharge Exam: Vitals:   05/16/19 2324 05/17/19 0741  BP: 122/90 122/80  Pulse: (!) 103 95  Resp: 19   Temp: 97.8 F (36.6 C) 98.5 F (36.9 C)  SpO2: 90% 100%   Vitals:   05/16/19 1715 05/16/19 1800 05/16/19 2324 05/17/19 0741  BP: 123/81 123/81 122/90 122/80  Pulse: (!) 106 (!) 106 (!) 103 95  Resp:   19   Temp:   97.8 F (36.6 C) 98.5 F (36.9 C)  TempSrc:   Oral Oral  SpO2:   90% 100%  Weight:      Height:        General: Pt is alert, awake, not in acute distress Cardiovascular:  S1/S2 +, no rubs, no gallops Respiratory: diminished breath sounds b/l Abdominal: Soft, NT, obese, hypoactive bowel sounds  Extremities: b/l LE edema, no cyanosis    The results of significant diagnostics from this hospitalization (including imaging, microbiology, ancillary and laboratory) are listed below for reference.     Microbiology: Recent Results (from the past 240 hour(s))  Urine culture     Status: None   Collection Time:  05/11/19  8:53 PM   Specimen: In/Out Cath Urine  Result Value Ref Range Status   Specimen Description   Final    IN/OUT CATH URINE Performed at [facility], [address], [place], [place] [address]    Special Requests   Final    NONE Performed at [facility], [address], [place], [place] [address]  Culture   Final    NO GROWTH Performed at [facility], [place] [address] [place], [place] [address]    Report Status 05/12/2019 FINAL  Final  Blood Culture (routine x 2)     Status: None   Collection Time: 05/11/19  9:17 PM   Specimen: BLOOD  Result Value Ref Range Status   Specimen Description BLOOD LEFT ANTECUBITAL  Final   Special Requests   Final    BOTTLES DRAWN AEROBIC AND ANAEROBIC Blood Culture adequate volume   Culture   Final    NO GROWTH 5 DAYS Performed at [facility], [address] [place], [place] [address]    Report Status 05/16/2019 FINAL  Final  Blood Culture (routine x 2)     Status: None   Collection Time: 05/11/19  9:17 PM   Specimen: BLOOD  Result Value Ref Range Status   Specimen Description BLOOD LEFT ANTECUBITAL  Final   Special Requests   Final    BOTTLES DRAWN AEROBIC AND ANAEROBIC Blood Culture adequate volume   Culture   Final    NO GROWTH 5 DAYS Performed at [facility], [address] [place], [place] [address]    Report Status 05/16/2019 FINAL  Final  SARS CORONAVIRUS 2 (TAT 6-24 HRS) Nasopharyngeal Nasopharyngeal Swab     Status: None   Collection Time: 05/11/19 10:31 PM   Specimen: Nasopharyngeal Swab  Result Value Ref Range Status   SARS Coronavirus 2 NEGATIVE NEGATIVE Final    Comment: (NOTE) SARS-CoV-2 target nucleic acids are NOT DETECTED. The SARS-CoV-2 RNA is generally detectable in upper and lower respiratory specimens during the acute phase of infection. Negative results do not preclude SARS-CoV-2 infection, do not rule out co-infections with other pathogens, and should not  be used as the sole basis for treatment or other patient management decisions. Negative results must be combined with clinical observations, patient history, and epidemiological information. The expected result is Negative. Fact Sheet for Patients: SugarRoll.be Fact Sheet for Healthcare Providers: https://www.woods-mathews.com/ This test is not yet approved or cleared by the [place] FDA and  has been authorized for detection and/or diagnosis of SARS-CoV-2 by FDA under an Emergency Use Authorization (EUA). This EUA will remain  in effect (meaning this test can be used) for the duration of the COVID-19 declaration under Section 56 4(b)(1) of the Act, 21 U.S.C. section 360bbb-3(b)(1), unless the authorization is terminated or revoked sooner. Performed at [facility], [place] [address], [place], [place] [address]   MRSA PCR Screening     Status: Abnormal   Collection Time: 05/13/19  9:34 AM   Specimen: Nasopharyngeal  Result Value Ref Range Status   MRSA by PCR POSITIVE (A) NEGATIVE Final    Comment:        The GeneXpert MRSA Assay (FDA approved for NASAL specimens only), is one component of a comprehensive MRSA colonization surveillance program. It is not intended to diagnose MRSA infection nor to guide or monitor treatment for MRSA infections. RESULT CALLED TO, READ BACK BY AND VERIFIED WITH: SARA BARBER AT 1109 ON 05/13/2019 [place]. Performed at [facility], [address] [place], [place] [address]      Labs: BNP (last 3 results) Recent Labs    05/13/19 0441  BNP 62.5   Basic Metabolic Panel: Recent Labs  Lab 05/13/19 0441 05/14/19 0626 05/15/19 0434 05/16/19 0647 05/17/19 0430  NA 140 139 141 139 140  K 3.2* 3.0* 3.4* 3.5 3.3*  CL 103 100 100 102 100  CO2 30 32 32 32 31  GLUCOSE 91 105* 94 87 86  BUN _0 CREATININE 0.45 0.46 0.34* <0.30* 0.41*  CALCIUM 8.1* 8.0* 8.3* 8.1* 8.4*    Liver Function Tests: Recent Labs  Lab 05/11/19 2117  AST 33  ALT 19  ALKPHOS 86  BILITOT 0.7  PROT 7.0  ALBUMIN 3.4*   No results for input(s): LIPASE, AMYLASE in the last 168 hours. No results for input(s): AMMONIA in the last 168 hours. CBC: Recent Labs  Lab 05/11/19 2117 05/12/19 0103 05/13/19 0441 05/14/19 0626 05/15/19 0434 05/16/19 0647 05/17/19 0430  WBC 12.4*   < > 8.9 8.6 8.4 7.0 8.2  NEUTROABS 9.6*  --   --   --   --   --   --   HGB 11.4*   < > 8.7* 8.0* 7.6* 7.3* 7.5*  HCT 37.9   < > 28.7* 25.4* 24.8* 22.9* 24.1*  MCV 89.2   < > 88.6 88.5 88.6 87.7 87.6  PLT 411*   < > 258 215 218 199 253   < > = values in this interval not displayed.   Cardiac Enzymes: No results for input(s): CKTOTAL, CKMB, CKMBINDEX, TROPONINI in the last 168 hours. BNP: Invalid input(s): POCBNP CBG: No results for input(s): GLUCAP in the last 168 hours. D-Dimer No results for input(s): DDIMER in the last 72 hours. Hgb A1c No results for input(s): HGBA1C in the last 72 hours. Lipid Profile No results for input(s): CHOL, HDL, LDLCALC, TRIG, CHOLHDL, LDLDIRECT in the last 72 hours. Thyroid function studies No results for input(s): TSH, T4TOTAL, T3FREE, THYROIDAB in the last 72 hours.  Invalid input(s): FREET3 Anemia work up No results for input(s): VITAMINB12, FOLATE, FERRITIN, TIBC, IRON, RETICCTPCT in the last 72 hours. Urinalysis    Component Value Date/Time   COLORURINE YELLOW (A) 05/11/2019 2045   APPEARANCEUR HAZY (A) 05/11/2019 2045   APPEARANCEUR Cloudy 10/25/2013 0810   LABSPEC 1.011 05/11/2019 2045   LABSPEC 1.005 10/25/2013 0810   PHURINE 6.0 05/11/2019 2045   GLUCOSEU NEGATIVE 05/11/2019 2045   GLUCOSEU Negative 10/25/2013 0810   GLUCOSEU NEG mg/dL 09/25/2006 2208   HGBUR SMALL (A) 05/11/2019 2045   BILIRUBINUR NEGATIVE 05/11/2019 2045   BILIRUBINUR Negative 10/25/2013 0810   KETONESUR NEGATIVE 05/11/2019 2045   PROTEINUR NEGATIVE 05/11/2019 2045    UROBILINOGEN 0.2 09/25/2006 2208   NITRITE NEGATIVE 05/11/2019 2045   LEUKOCYTESUR NEGATIVE 05/11/2019 2045   LEUKOCYTESUR 2+ 10/25/2013 0810   Sepsis Labs Invalid input(s): PROCALCITONIN,  WBC,  LACTICIDVEN Microbiology Recent Results (from the past 240 hour(s))  Urine culture     Status: None   Collection Time: 05/11/19  8:53 PM   Specimen: In/Out Cath Urine  Result Value Ref Range Status   Specimen Description   Final    IN/OUT CATH URINE Performed at [facility], [address] [place], [place] [address]    Special Requests   Final    NONE Performed at [facility], [address] [place], [place] [address]    Culture   Final    NO GROWTH Performed at [facility], [place] [address] [place], [place] [address]    Report Status 05/12/2019 FINAL  Final  Blood Culture (routine x 2)     Status: None   Collection Time: 05/11/19  9:17 PM   Specimen: BLOOD  Result Value Ref Range Status   Specimen Description  BLOOD LEFT ANTECUBITAL  Final   Special Requests   Final    BOTTLES DRAWN AEROBIC AND ANAEROBIC Blood Culture adequate volume   Culture   Final    NO GROWTH 5 DAYS Performed at [facility], [address] [place], [place] [address]    Report Status 05/16/2019 FINAL  Final  Blood Culture (routine x 2)     Status: None   Collection Time: 05/11/19  9:17 PM   Specimen: BLOOD  Result Value Ref Range Status   Specimen Description BLOOD LEFT ANTECUBITAL  Final   Special Requests   Final    BOTTLES DRAWN AEROBIC AND ANAEROBIC Blood Culture adequate volume   Culture   Final    NO GROWTH 5 DAYS Performed at [facility], [address], [place], [place] [address]    Report Status 05/16/2019 FINAL  Final  SARS CORONAVIRUS 2 (TAT 6-24 HRS) Nasopharyngeal Nasopharyngeal Swab     Status: None   Collection Time: 05/11/19 10:31 PM   Specimen: Nasopharyngeal Swab  Result Value Ref Range Status   SARS Coronavirus 2 NEGATIVE  NEGATIVE Final    Comment: (NOTE) SARS-CoV-2 target nucleic acids are NOT DETECTED. The SARS-CoV-2 RNA is generally detectable in upper and lower respiratory specimens during the acute phase of infection. Negative results do not preclude SARS-CoV-2 infection, do not rule out co-infections with other pathogens, and should not be used as the sole basis for treatment or other patient management decisions. Negative results must be combined with clinical observations, patient history, and epidemiological information. The expected result is Negative. Fact Sheet for Patients: SugarRoll.be Fact Sheet for Healthcare Providers: https://www.woods-mathews.com/ This test is not yet approved or cleared by the [place] FDA and  has been authorized for detection and/or diagnosis of SARS-CoV-2 by FDA under an Emergency Use Authorization (EUA). This EUA will remain  in effect (meaning this test can be used) for the duration of the COVID-19 declaration under Section 56 4(b)(1) of the Act, 21 U.S.C. section 360bbb-3(b)(1), unless the authorization is terminated or revoked sooner. Performed at [facility], [place] [address], [place], [place] [address]   MRSA PCR Screening     Status: Abnormal   Collection Time: 05/13/19  9:34 AM   Specimen: Nasopharyngeal  Result Value Ref Range Status   MRSA by PCR POSITIVE (A) NEGATIVE Final    Comment:        The GeneXpert MRSA Assay (FDA approved for NASAL specimens only), is one component of a comprehensive MRSA colonization surveillance program. It is not intended to diagnose MRSA infection nor to guide or monitor treatment for MRSA infections. RESULT CALLED TO, READ BACK BY AND VERIFIED WITH: SARA BARBER AT 1109 ON 05/13/2019 [place]. Performed at [facility], [address] [place], [place] [address]      Time coordinating discharge: Over 30 minutes  SIGNED:   Wyvonnia Dusky,  MD  [facility] 05/17/2019, 9:46 AM Pager   If 7PM-7AM, please contact night-coverage [email or web address]

## 2019-05-17 NOTE — TOC Transition Note (Signed)
Transition of Care [facility]) - CM/SW Discharge Note   Patient Details  Name: Melanie Berry MRN: [id number] Date of Birth: September 15, 1965  Transition of Care [facility]) CM/SW Contact:  Lucy Chris, LCSW Phone Number: 05/17/2019, 9:08 AM   Clinical Narrative:   Pt changed her mind back to going back to [facility]. [facility] has a bed. Once MD completes paperwork will set up transport. Doe not need a new COVID test according to [facility]. Bedside RN to call report [phone]. Hospice to continue to follow at facility. Husband made aware of her transferring back today.    Final next level of care: Skilled Nursing Facility Barriers to Discharge: Barriers Resolved   Patient Goals and CMS Choice        Discharge Placement   Existing PASRR number confirmed : 05/13/19          Patient chooses bed at: [facility] Patient to be transferred to facility by: EMS Name of family member notified: Husband Patient and family notified of of transfer: 05/17/19  Discharge Plan and Services                                     Social Determinants of Health (SDOH) Interventions     Readmission Risk Interventions No flowsheet data found.

## 2019-05-17 NOTE — Progress Notes (Signed)
Visit made. Patient seen sitting up in bed, alert and interactive with Clinical research associate. She voiced her understanding of planned discharge today.  She has received 2 doses of PRN Vicodin and one dose of IV morphine for management of pain.  She also continues on 4 liiters of oxygen, baseline is 2. She will discharge with continued oral antibiotics, long acting morphine 30 mg bid and oral liquid morphine 5 ml q 2 hrs PRN for break through.  Writer assisted patient with ordering her lunch. Staff RN Joy made aware of need for good pain control needed for EMS transport. Hospice team alerted to discharge. Thank you. Dayna Barker BSN, RN, [facility] [employer] [phone]

## 2021-01-31 ENCOUNTER — Encounter: Payer: Self-pay | Admitting: Nurse Practitioner

## 2021-01-31 ENCOUNTER — Non-Acute Institutional Stay: Payer: Medicaid Other | Admitting: Nurse Practitioner

## 2021-01-31 ENCOUNTER — Other Ambulatory Visit: Payer: Self-pay

## 2021-01-31 VITALS — BP 136/84 | HR 86 | Temp 97.0°F | Resp 18 | Wt 278.0 lb

## 2021-01-31 DIAGNOSIS — G600 Hereditary motor and sensory neuropathy: Secondary | ICD-10-CM

## 2021-01-31 DIAGNOSIS — G8929 Other chronic pain: Secondary | ICD-10-CM

## 2021-01-31 DIAGNOSIS — R5381 Other malaise: Secondary | ICD-10-CM

## 2021-01-31 DIAGNOSIS — Z515 Encounter for palliative care: Secondary | ICD-10-CM

## 2021-01-31 NOTE — Progress Notes (Signed)
Therapist, nutritional Palliative Care Consult Note Telephone: [phone]  Fax: [phone]    Date of encounter: 01/31/21 3:51 PM PATIENT NAME: Melanie Berry [address] [place] [address]   [phone] (home)  DOB: 1965/07/11 MRN: [id number] PRIMARY CARE PROVIDER:    [facility]  RESPONSIBLE PARTY:    Contact Information     Name Relation Home Work Melanie Berry, [place] Spouse   [phone]   Melanie Berry [phone]        I met face to face with patient in facility. Palliative Care was asked to follow this patient by consultation request of  Melanie Berry to address advance care planning and complex medical decision making. This is a follow up visit.                                  ASSESSMENT AND PLAN / RECOMMENDATIONS: Symptom Management/Plan: 1. Advance Care Planning; DNR; continue with monitoring for decline, will revisit Hospice when meets eligibility. Was discharged from [facility] 01/12/2021.  2. 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 We talked about current regimen will continue with pain clinic appointment pending; Currently taking  Fentanyl 75 mcg q72 hrs  Meloxicam 7.5mg  daily  Morphine Sulfate ER Tablet Extended Release 30 MG, 1 tablet by mouth every 12 hours for pain       Melanie Contin Tablet Extended Release 15 MG (Morphine Sulfate ER); 1 tablet by mouth in the afternoon for pain Give with 30 mg tab to equal 45 MG  3. 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 Berry endorses she will try if she can be put in a w/c rather than geri-chair. We talked about safety concerns, falls. Melanie. Berry endorses she is motivated to try, will see if therapy would be an option with further discussion with primary  4.  Goals of Care: Goals include to maximize quality of life and symptom management. Our advance care planning conversation included a discussion about:    The value and importance of advance care planning  Exploration of personal, cultural or spiritual beliefs that might influence medical decisions  Exploration of goals of care in the event of a sudden injury or illness  Identification and preparation of a healthcare agent  Review and updating or creation of an advance directive document.  5. Palliative care encounter; Palliative care encounter; Palliative medicine team will continue to support patient, patient's family, and medical team. Visit consisted of counseling and education dealing with the complex and emotionally intense issues of symptom management and palliative care in the setting of serious and potentially life-threatening illness  6. f/u 1 month for ongoing monitoring chronic disease progression, ongoing discussions complex medical decision making  Follow up Palliative Care Visit: Palliative care will continue to follow for complex medical decision making, advance care planning, and clarification of goals. Return 4 weeks or prn.  I spent 70 minutes providing this consultation starting at 11:30am. More than 50% of the time in this consultation was spent in counseling and care coordination.  PPS: 30%  Chief Complaint: Follow up palliative consult for complex medical decision making  HISTORY OF PRESENT ILLNESS:  KEILEY Berry is a 55 y.o. year old female  with multiple medical problems including morbid obesity,  COPD, O2 dependence, chronic lower extremity venous stasis with venous stasis ulcers, muscular dystrophy, Charcot's arthropathy, chronic pain syndrome, seizure disorder, gerd, IDA, insomnia, anxiety, depression, schizoaffective disorder with bipolar. Melanie. Berry resides SNF at [facility]. Melanie. Berry was d/c from hospice at the facility due to stability. Melanie. Berry  requires staff to turn and position her, total ADL's including bathing, dressing, incontinence bowel and bladder. Melanie. Berry requires assistance with feeding with fair appetite. Staff endorses Melanie. Berry does require continuous O2, pain clinic appointment pending for chronic pain syndrome. PC re-consulted (PC was involved prior to Hospice) well known to this provider. I visited and observed Melanie. Berry. We talked about purpose of PC visit, Melanie. Berry in agreement. We talked about past medical history, chronic disease progression. We talked about symptoms of pain, ros. We talked about pending pain clinic appointment. We talked about appetite. We talked about nutrition. We talked about getting oob. Melanie. Berry requested a w/c rather than a geri-chair. We talked about safety in the geri-chair. We talked about functional decline overall. We talked about medical goals. We talked about residing at facility, support system, quality of life, role pc in [place]. Therapeutic listening and emotional support provided. Questions answered. I updated staff, no changes today to poc. Will continue positive encouragement, motivation for mobility, self independence as much as Melanie. Berry functional limitations allow.  History obtained from review of EMR, discussion with facility staff and Melanie. Berry.  I reviewed available labs, medications, imaging, studies and related documents from the EMR.  Records reviewed and summarized above.   ROS Full 10 system review of systems performed and negative with exception of: as per HPI.   Physical Exam: Constitutional: NAD General: frail appearing, obese, pale, severely debilitated female, chronically ill  EYES: lids intact ENMT: oral mucous membranes moist CV: S1S2, RRR, +BLE edema Pulmonary: decreased throughout, no increased work of breathing, no cough, O2 Abdomen: normo-active BS + 4 quadrants, soft and non tender MSK: functionally quadriplegic; bed-bound requires lift Skin: warm and  dry Neuro:  + generalized weakness,  mild + cognitive impairment Psych: non-anxious affect, A and O x 3 Thank you for the opportunity to participate in the care of Melanie. Soffer.  The palliative care team will continue to follow. Please call our office at [phone] if we can be of additional assistance.   Questions and concerns were addressed.  Provided general support and encouragement, no other unmet needs identified   This chart was dictated using voice recognition software.  Despite best efforts to proofread,  errors can occur which can change the documentation meaning.   Shelbey Spindler Ihor Gully, NP

## 2021-03-12 ENCOUNTER — Non-Acute Institutional Stay: Payer: Medicaid Other | Admitting: Nurse Practitioner

## 2021-03-12 ENCOUNTER — Other Ambulatory Visit: Payer: Self-pay

## 2021-03-12 ENCOUNTER — Encounter: Payer: Self-pay | Admitting: Nurse Practitioner

## 2021-03-12 VITALS — BP 96/49 | HR 78 | Temp 97.5°F | Resp 18 | Wt 275.5 lb

## 2021-03-12 DIAGNOSIS — R0602 Shortness of breath: Secondary | ICD-10-CM

## 2021-03-12 DIAGNOSIS — J449 Chronic obstructive pulmonary disease, unspecified: Secondary | ICD-10-CM

## 2021-03-12 DIAGNOSIS — R5381 Other malaise: Secondary | ICD-10-CM

## 2021-03-12 DIAGNOSIS — Z515 Encounter for palliative care: Secondary | ICD-10-CM

## 2021-03-12 NOTE — Progress Notes (Addendum)
[facility]er, [facility] Palliative Care Consult Note Telephone: [phone]  Fax: [phone]    Date of encounter: 03/12/21 8:19 PM PATIENT NAME: [place] [address]   [phone] (home)  DOB: 05/07/1965 MRN: [id number] PRIMARY CARE PROVIDER:    [facility]  RESPONSIBLE PARTY:    Contact Information     Name Relation Home Work [place], [place] Spouse   [phone]   [name] [phone]        I met face to face with patient in facility. Palliative Care was asked to follow this patient by consultation request of  [place] to address advance care planning and complex medical decision making. This is a follow up visit.                                  ASSESSMENT AND PLAN / RECOMMENDATIONS: Symptom Management/Plan: 1. Advance Care Planning; DNR; continue with monitoring for decline, will revisit Hospice when meets eligibility. Was discharged from [facility] 01/12/2021.   2. Shortness of breath secondary to COPD, O2 dependent, continue to monitor respiration therapy, O2, inhalation therapy, discussed importance of being oob, helps with respiratory status.    3. 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 Backs is sitting in a recliner in her room. Praised Ms. Gerke for getting oob. Ms. Triplett endorses she is not feeling well. Discussed importance of mobility, oob, socialization.   12/29/2020 weight 275.5 lbs 01/21/2021 weight 278 lbs 02/28/2021 weight 282 lbs  4. Depression with bipolar, schizoaffective followed by psychiatry with last visit 03/08/2021 for schizoaffective disorder, anxiety, insomnia, nightmare disorder, started buspirone 7.5mg  at 8am and 2pm. We talked about quality of life. Ms. Karan endorses she feels suicidal ideations, no plan. Ms. Vaux endorses she feels more hopeless, not feeling well overall, her legs hurt. We talked about  recent psychotherapy, psychiatry visit, medications, residing at facility, Emotional support. I called Abran Cantor PA, discussed concerns of suicidal ideations, psychotherapy to see tomorrow, Arlyss Gandy will contact psychiatry, talked with nursing for further poc.   5. BLE, erytherma, discussed further with Abran Cantor PA primary treatment. History of same. No fever.   6. Palliative care encounter; Palliative care encounter; Palliative medicine team will continue to support patient, patient's family, and medical team. Visit consisted of counseling and education dealing with the complex and emotionally intense issues of symptom management and palliative care in the setting of serious and potentially life-threatening illness   7. f/u 1 month for ongoing monitoring chronic disease progression, ongoing discussions complex medical decision making   Follow up Palliative Care Visit: Palliative care will continue to follow for complex medical decision making, advance care planning, and clarification of goals. Return 4 weeks or prn.  I spent 71 minutes providing this consultation started at 1:05 pm. More than 50% of the time in this consultation was spent in counseling and care coordination. PPS: 30%  Chief Complaint: Follow up palliative consult for complex medical decision making  HISTORY OF PRESENT ILLNESS:  EMALI HEYWARD is a 56 y.o. year old female  with multiple medical problems including morbid obesity, COPD, O2 dependence, chronic lower extremity venous stasis with venous stasis ulcers, muscular dystrophy, Charcot's arthropathy, chronic pain syndrome, seizure disorder, gerd, IDA, insomnia, anxiety, depression, schizoaffective disorder with bipolar. Ms. Vallandingham resides SNF at [facility]. Ms. Vuncannon was d/c from hospice at  the facility due to stability. Ms. Melena requires staff to turn and position her, total ADL's including bathing, dressing, incontinence bowel and bladder. Ms. Strayer  requires assistance with feeding herself.   History obtained from review of EMR, discussion with Facility staff and Ms. Biel.  I reviewed available labs, medications, imaging, studies and related documents from the EMR.  Records reviewed and summarized above.   ROS 10 point system reviewed with [name] and facility staff all negative except HPI  Physical Exam Constitutional: NAD General: frail appearing, obese, debilitated, chronically ill female EYES:  lids intact ENMT: oral mucous membranes moist CV: S1S2, RRR Pulmonary: decrease throughout, no increased work of breathing, no cough, O2 Abdomen: normo-active BS + 4 quadrants, soft and non tender MSK: functional quadriplegic Skin: warm and dry, bilateral lower leg edema with erythema Neuro:  + generalized weakness,  no cognitive impairment Psych: non-anxious affect, A and O x 3  Thank you for the opportunity to participate in the care of Ms. Dowse.  The palliative care team will continue to follow. Please call our office at [phone] if we can be of additional assistance.   This chart was dictated using voice recognition software.  Despite best efforts to proofread,  errors can occur which can change the documentation meaning.   Questions and concerns were addressed. Provided general support and encouragement, no other unmet needs identified   Reyonna Haack Ihor Gully, NP

## 2021-04-02 ENCOUNTER — Non-Acute Institutional Stay: Payer: Medicaid Other | Admitting: Nurse Practitioner

## 2021-04-02 ENCOUNTER — Encounter: Payer: Self-pay | Admitting: Nurse Practitioner

## 2021-04-02 ENCOUNTER — Other Ambulatory Visit: Payer: Self-pay

## 2021-04-02 VITALS — BP 132/83 | HR 82 | Temp 97.7°F | Resp 18 | Wt 280.7 lb

## 2021-04-02 DIAGNOSIS — R5381 Other malaise: Secondary | ICD-10-CM

## 2021-04-02 DIAGNOSIS — G600 Hereditary motor and sensory neuropathy: Secondary | ICD-10-CM

## 2021-04-02 DIAGNOSIS — R0602 Shortness of breath: Secondary | ICD-10-CM

## 2021-04-02 DIAGNOSIS — J449 Chronic obstructive pulmonary disease, unspecified: Secondary | ICD-10-CM

## 2021-04-02 DIAGNOSIS — Z515 Encounter for palliative care: Secondary | ICD-10-CM

## 2021-04-02 NOTE — Progress Notes (Signed)
[facility] Palliative Care Consult Note Telephone: [phone]  Fax: [phone]    Date of encounter: 04/02/21 8:05 PM PATIENT NAME: [place] [address]   [phone] (home)  DOB: 10-08-1965 MRN: [id number] PRIMARY CARE PROVIDER:    [facility]  RESPONSIBLE PARTY:    Contact Information     Name Relation Home Work [place], [place] Spouse   [phone]   [name] [phone]        I met face to face with patient in facility. Palliative Care was asked to follow this patient by consultation request of  [place] to address advance care planning and complex medical decision making. This is a follow up visit.                                  ASSESSMENT AND PLAN / RECOMMENDATIONS:  Symptom Management/Plan: 1. Advance Care Planning; DNR; continue with monitoring for decline, will revisit Hospice when meets eligibility. Was discharged from [facility] 01/12/2021.   2. Shortness of breath secondary to COPD, O2 dependent, continue to monitor respiration therapy, O2, inhalation therapy, discussed importance of being oob, helps with respiratory status.    3. 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 Kunda is sitting in a recliner in her room. Praised Ms. Huether for getting oob. Ms. Bicknell endorses she is not feeling well. Discussed importance of mobility, oob, socialization.    12/29/2020 weight 275.5 lbs 01/21/2021 weight 278 lbs 02/28/2021 weight 282 lbs 03/23/2021 weight 280.7 lbs 4. Depression with bipolar, schizoaffective followed by psychiatry; discussed quality of life   5. Palliative care encounter; Palliative care encounter; Palliative medicine team will continue to support patient, patient's family, and medical team. Visit consisted of counseling and education dealing with the complex and emotionally intense issues of symptom  management and palliative care in the setting of serious and potentially life-threatening illness   6. f/u 1 month for ongoing monitoring chronic disease progression, ongoing discussions complex medical decision making   Follow up Palliative Care Visit: Palliative care will continue to follow for complex medical decision making, advance care planning, and clarification of goals. Return 4 weeks or prn.  I spent 37 minutes providing this consultation starting at 2:15pm. More than 50% of the time in this consultation was spent in counseling and care coordination.  PPS: 30%  Chief Complaint: Follow up palliative consult for complex medical decision making  HISTORY OF PRESENT ILLNESS:  COLENA KETTERMAN is a 56 y.o. year old female  with  multiple medical problems including morbid obesity, COPD, O2 dependence, chronic lower extremity venous stasis with venous stasis ulcers, muscular dystrophy, Charcot's arthropathy, chronic pain syndrome, seizure disorder, gerd, IDA, insomnia, anxiety, depression, schizoaffective disorder with bipolar. Ms. Seif resides SNF at [facility]. Ms. Sanz was d/c from hospice at the facility due to stability. Ms. Rosamilia requires staff to turn and position her, total ADL's including bathing, dressing, incontinence bowel and bladder. Ms. Swartzendruber requires assistance with feeding herself.  I visited and observed Ms. Supan who is lying in bed, crying. Ms. Barga wishes to get oob, has not been able to in several days. We talked about ros, symptoms, functional debility. We talked about mobility, quality of life, residing at facility. We talked about appetite, foods, nutrition. We talked about pain, shortness of breath. We talked about coping strategies.  We talked about medial goals, role pc in poc. Most pc visit supportive. Discussed with staff to get Ms. Geralyn Flash, updated on plan.   History obtained from review of EMR, discussion with Facility staff and Ms. Mentzer.  I  reviewed available labs, medications, imaging, studies and related documents from the EMR.  Records reviewed and summarized above.   ROS 10 point system reviewed with facility staff and Ms. Gebert all negative except HPI Physical Exam Constitutional: NAD General: frail appearing, chronically ill, severely debilitated female EYES: lids intact ENMT: oral mucous membranes moist CV: S1S2, RRR, +BLE edema Pulmonary: clear, decrease bases, no increased work of breathing, + cough, O2 Abdomen: normo-active BS + 4 quadrants, soft and non tender MSK: +functional quadriplegic; bed-bound Skin: warm and dry Neuro:  + generalized weakness,  no cognitive impairment Psych:flat affect, A and O Thank you for the opportunity to participate in the care of Ms. Takayama.  The palliative care team will continue to follow. Please call our office at [phone] if we can be of additional assistance.   Sindia Kowalczyk Ihor Gully, NP

## 2021-04-09 ENCOUNTER — Other Ambulatory Visit: Payer: Self-pay

## 2021-04-09 ENCOUNTER — Encounter: Payer: Self-pay | Admitting: Nurse Practitioner

## 2021-04-09 ENCOUNTER — Non-Acute Institutional Stay: Payer: Medicaid Other | Admitting: Nurse Practitioner

## 2021-04-09 VITALS — BP 126/76 | HR 78 | Temp 98.7°F | Resp 18 | Wt 280.7 lb

## 2021-04-09 DIAGNOSIS — R5381 Other malaise: Secondary | ICD-10-CM

## 2021-04-09 DIAGNOSIS — J449 Chronic obstructive pulmonary disease, unspecified: Secondary | ICD-10-CM

## 2021-04-09 DIAGNOSIS — G600 Hereditary motor and sensory neuropathy: Secondary | ICD-10-CM

## 2021-04-09 DIAGNOSIS — Z515 Encounter for palliative care: Secondary | ICD-10-CM

## 2021-04-09 DIAGNOSIS — R0602 Shortness of breath: Secondary | ICD-10-CM

## 2021-04-09 NOTE — Progress Notes (Addendum)
Therapist, nutritional Palliative Care Consult Note Telephone: [phone]  Fax: [phone]    Date of encounter: 04/09/21 2:49 PM PATIENT NAME: Melanie Berry [address] [place] [address]   [phone] (home)  DOB: 1965-06-14 MRN: [id number] PRIMARY CARE PROVIDER:    [facility]  RESPONSIBLE PARTY:    Contact Information     Name Relation Home Work [place], [place] Spouse   [phone]   [name] [phone]        I met face to face with patient in facility. Palliative Care was asked to follow this patient by consultation request of   [facility] to address advance care planning and complex medical decision making. This is a follow up visit.                                  ASSESSMENT AND PLAN / RECOMMENDATIONS:  Symptom Management/Plan: 1. Advance Care Planning; DNR; continue with monitoring for decline, will revisit Hospice when meets eligibility. Was discharged from [facility] 01/12/2021.   2. Shortness of breath secondary to COPD, O2 dependent, continue to monitor respiration therapy, O2, inhalation therapy, discussed importance of being oob, helps with respiratory status.    3. 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 Discussed importance of mobility, oob, socialization.    12/29/2020 weight 275.5 lbs 01/21/2021 weight 278 lbs 02/28/2021 weight 282 lbs 03/23/2021 weight 280.7 lbs BMI 48.2 4. Depression with bipolar, schizoaffective followed by psychiatry; discussed quality of life; continue current poc   5. Palliative care encounter; Palliative care encounter; Palliative medicine team will continue to support patient, patient's family, and medical team. Visit consisted of counseling and education dealing with the complex and emotionally intense issues of symptom management and palliative care in the setting of serious and  potentially life-threatening illness   6. f/u 1 month for ongoing monitoring chronic disease progression, ongoing discussions complex medical decision making  Follow up Palliative Care Visit: Palliative care will continue to follow for complex medical decision making, advance care planning, and clarification of goals. Return 4 weeks or prn.  I spent 69 minutes providing this consultation starting at 12:45pm. More than 50% of the time in this consultation was spent in counseling and care coordination.  PPS: 30%  Chief Complaint: Follow up palliative consult for complex medical decision making  HISTORY OF PRESENT ILLNESS:  ONIE KASPAREK is a 56 y.o. year old female  with multiple medical problems including morbid obesity, COPD, O2 dependence, chronic lower extremity venous stasis with venous stasis ulcers, muscular dystrophy, Charcot's arthropathy, chronic pain syndrome, seizure disorder, gerd, IDA, insomnia, anxiety, depression, schizoaffective disorder with bipolar. Melanie Berry resides SNF at [facility]. Melanie Berry was d/c from hospice at the facility due to stability. Melanie Berry requires staff to turn and position her, total ADL's including bathing, dressing, incontinence bowel and bladder. Melanie Berry requires assistance with feeding herself.  I visited and observed Melanie Berry who is sitting in her recliner smiling, no complaints. Melanie Berry requested PC visit today for supportive, therapeutic listening, coping strategies. We talked about ros, symptoms, functional debility. We talked about mobility, quality of life, residing at facility. We talked about appetite, foods, nutrition. We talked about pain, shortness of breath. We talked about coping strategies. We talked about medial goals, role pc in poc. Most pc visit supportive.  Updated staff on plan.    History obtained from review of EMR, discussion with facility staff and Melanie Berry.  I reviewed available labs, medications, imaging,  studies and related documents from the EMR.  Records reviewed and summarized above.   ROS 10 point system reviewed with Melanie Berry and staff all negative except HPI  Physical Exam: Constitutional: NAD General: frail appearing, obese, debilitated pleasant female EYES:  lids intact ENMT: oral mucous membranes moist CV: S1S2, RRR, +BLE edema Pulmonary: clear, decrease bases, no increased work of breathing, +chronic cough, O2 Abdomen: normo-active BS + 4 quadrants, soft and non tender MSK: +functional quadriplegic Skin: warm and dry Neuro:  + generalized weakness,  no cognitive impairment Psych: non-anxious affect, A and O x 3 Thank you for the opportunity to participate in the care of Melanie Berry.  The palliative care team will continue to follow. Please call our office at [phone] if we can be of additional assistance.   Aleyssa Pike Ihor Gully, NP

## 2021-05-04 ENCOUNTER — Other Ambulatory Visit: Payer: Self-pay

## 2021-05-04 ENCOUNTER — Encounter: Payer: Self-pay | Admitting: Nurse Practitioner

## 2021-05-04 ENCOUNTER — Non-Acute Institutional Stay: Payer: Medicaid Other | Admitting: Nurse Practitioner

## 2021-05-04 VITALS — BP 130/70 | HR 72 | Temp 96.8°F | Resp 18 | Wt 274.1 lb

## 2021-05-04 DIAGNOSIS — R0602 Shortness of breath: Secondary | ICD-10-CM

## 2021-05-04 DIAGNOSIS — G600 Hereditary motor and sensory neuropathy: Secondary | ICD-10-CM

## 2021-05-04 DIAGNOSIS — J449 Chronic obstructive pulmonary disease, unspecified: Secondary | ICD-10-CM

## 2021-05-04 DIAGNOSIS — R5381 Other malaise: Secondary | ICD-10-CM

## 2021-05-04 NOTE — Progress Notes (Signed)
? ? ?Manufacturing engineer ?Community Palliative Care Consult Note ?Telephone: [phone]  ?Fax: [phone]  ? ? ?Date of encounter: 05/04/21 ?7:53 PM ?PATIENT NAME: Melanie Berry ?Melanie Berry ?Melanie Berry   ?[phone] (home)  ?DOB: [birth year] ?MRN: [id number] ?PRIMARY CARE PROVIDER:    ?[facility] ? ?RESPONSIBLE PARTY:    ?Contact Information   ? ? Name Relation Home Work Mobile  ? [place], Dickens   [phone]  ? Melanie Berry [phone]    ? ?  ? ?I met face to face with patient in /facility. Palliative Care was asked to follow this patient by consultation request of [place] to address advance care planning and complex medical decision making. This is a follow up visit.                                  ?ASSESSMENT AND PLAN / RECOMMENDATIONS:  ?Symptom Management/Plan: ?1. Advance Care Planning; DNR; continue with monitoring for decline, will revisit Hospice when meets eligibility. (Discharged from [facility] 01/12/2021). ?  ?2. Shortness of breath secondary to COPD, O2 dependent, continue to monitor respiration therapy, O2, inhalation therapy, discussed importance of being oob, helps with respiratory status.  ?  ?3. 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 Discussed importance of mobility, oob, socialization.  ?  ?12/29/2020 weight 275.5 lbs ?01/21/2021 weight 278 lbs ?02/28/2021 weight 282 lbs ?03/23/2021 weight 280.7 lbs ?Weight 274.1 lbs ?4. Depression with bipolar, schizoaffective improved; followed by psychiatry; discussed quality of life; continue current poc ?  ?5. Palliative care encounter; Palliative care encounter; Palliative medicine team will continue to support patient, patient's family, and medical team. Visit consisted of counseling and education dealing with the complex and emotionally intense issues of symptom management and palliative care in the setting of  serious and potentially life-threatening illness ?  ?6. f/u 1 month for ongoing monitoring chronic disease progression, ongoing discussions complex medical decision making ? ?Follow up Palliative Care Visit: Palliative care will continue to follow for complex medical decision making, advance care planning, and clarification of goals. Return 4 weeks or prn. ? ?I spent 42 minutes providing this consultation. More than 50% of the time in this consultation was spent in counseling and care coordination. ? ?PPS: 30% ? ?Chief Complaint: Follow up palliative consult for complex medical decision making ? ?HISTORY OF PRESENT ILLNESS:  Melanie Berry is a 56 y.o. year old female  with multiple medical problems including morbid obesity, COPD, O2 dependence, chronic lower extremity venous stasis with venous stasis ulcers, muscular dystrophy, Charcot's arthropathy, chronic pain syndrome, seizure disorder, gerd, Melanie, insomnia, anxiety, depression, schizoaffective disorder with bipolar. Melanie Berry resides SNF at [facility]. Melanie Berry was d/c from hospice at the facility due to stability. Melanie Berry requires staff to turn and position her, total ADL's including bathing, dressing, incontinence bowel and bladder. Melanie Berry requires assistance with feeding herself.  I visited and observed Melanie Berry who is sitting in her recliner smiling, no complaints. Melanie Berry requested PC visit today for supportive, therapeutic listening, coping strategies. We talked about ros, symptoms, functional debility. We talked about mobility, quality of life, residing at facility. We talked about appetite, foods, nutrition. We talked about pain, shortness of breath. We talked about coping strategies. We talked about medial goals, role pc in poc. Most pc visit supportive. Updated staff on  plan.   ? ?History obtained from review of EMR, discussion with facility staff and  Melanie Berry.  ?I reviewed available labs, medications, imaging, studies and  related documents from the EMR.  Records reviewed and summarized above.  ? ?ROS ?10 point system reviewed with staff and Melanie Berry all negative except HPI ? ?Physical Exam: ?Constitutional: NAD ?General: frail appearing, obese, debilitated pleasant female ?EYES: lids intact ?ENMT: oral mucous membranes moist ?CV: S1S2, RRR, +BLE edema ?Pulmonary: L+decrease bases, no increased work of breathing, no cough,  O2 ?Abdomen: soft and non tender ?MSK: +functional quadriplegic, bed-bound; hoyer to chair ?Skin: warm and dry ?Neuro: + generalized weakness,  no cognitive impairment ?Psych: non-anxious affect, A and O x 3 ?Thank you for the opportunity to participate in the care of Melanie Berry.  The palliative care team will continue to follow. Please call our office at [phone] if we can be of additional assistance.  ? ?Taila Basinski Z Hilde Churchman, NP  ?  ?

## 2021-05-18 ENCOUNTER — Non-Acute Institutional Stay: Payer: Medicaid Other | Admitting: Nurse Practitioner

## 2021-05-18 ENCOUNTER — Encounter: Payer: Self-pay | Admitting: Nurse Practitioner

## 2021-05-18 VITALS — BP 130/70 | HR 72 | Temp 97.6°F | Resp 18 | Wt 274.1 lb

## 2021-05-18 DIAGNOSIS — Z515 Encounter for palliative care: Secondary | ICD-10-CM

## 2021-05-18 DIAGNOSIS — G8929 Other chronic pain: Secondary | ICD-10-CM

## 2021-05-18 DIAGNOSIS — R5381 Other malaise: Secondary | ICD-10-CM

## 2021-05-18 DIAGNOSIS — G600 Hereditary motor and sensory neuropathy: Secondary | ICD-10-CM

## 2021-05-18 NOTE — Progress Notes (Addendum)
? ? ?Manufacturing engineer ?Community Palliative Care Consult Note ?Telephone: [phone]  ?Fax: [phone]  ? ? ?Date of encounter: 05/18/21 ?4:14 PM ?PATIENT NAME: Melanie Berry ?[address] [place] ?[place] [address]   ?[phone] (home)  ?DOB: February 09, 1966 ?MRN: [id number] ?PRIMARY CARE PROVIDER:    ?Melanie Berry  ? ?RESPONSIBLE PARTY:    ?Contact Information   ? ? Name Relation Home Work Mobile  ? [place], Melanie Berry   [phone]  ? Melanie Berry [phone]    ? ?  ? ?I met face to face with patient in /facility. Palliative Care was asked to follow this patient by consultation request of  Melanie Berry to address advance care planning and complex medical decision making. This is a follow up visit.                                  ?ASSESSMENT AND PLAN / RECOMMENDATIONS:  ?Symptom Management/Plan: ?1. Advance Care Planning; DNR; continue with monitoring for decline, will revisit Hospice when meets eligibility. Was discharged from [facility] 01/12/2021. ?  ?2. Shortness of breath secondary to COPD, O2 dependent, continue to monitor respiration therapy, O2, inhalation therapy, discussed importance of being oob, helps with respiratory status.  ?  ?3. 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 Re-visited importance of mobility, oob, socialization.  ?  ?12/29/2020 weight 275.5 lbs ?01/21/2021 weight 278 lbs ?02/28/2021 weight 282 lbs ?03/23/2021 weight 280.7 lbs ?04/24/2021 weight 274.1 lbs ? ?4. Chronic pain secondary to debility, Melanie Berry; reviewed medications, ongoing pain, currently being managed with [facility] ER $RemoveBef'30mg'oatMAnXSgD$  q12 with $Remove'15mg'YnfORPH$  in afternoon. Fentanyl patch 63mcg q72 hrs. Continue to monitor on pain scale, consider decreasing dose as Melanie Berry has been on opiates for some time. Melanie Berry became upset when discussion of decreasing pain medications. Discussed will continue with discussions with risk  factors of obesity. F/u with primary for further discussion ? ?5. Depression with bipolar, schizoaffective followed by psychiatry; discussed quality of life; continue current poc ?Emotional support provided. Melanie Berry endorses it is very hard to reside at a facility, having others care for her, being debilitated with limitations. Coping strategies discussed.  ?Follow up Palliative Care Visit: Palliative care will continue to follow for complex medical decision making, advance care planning, and clarification of goals. Return 4 weeks or prn. ? ?I spent 40 minutes providing this consultation starting at 10:30 am. More than 50% of the time in this consultation was spent in counseling and care coordination. ?PPS: 30% ? ?Chief Complaint: Follow up palliative consult for complex medical decision making ? ?HISTORY OF PRESENT ILLNESS:  Melanie Berry is a 56 y.o. year old female  with multiple medical problems including morbid obesity, COPD, O2 dependence, chronic lower extremity venous stasis with venous stasis ulcers, muscular dystrophy, Charcot's arthropathy, chronic pain syndrome, seizure disorder, gerd, Melanie, insomnia, anxiety, depression, schizoaffective disorder with bipolar. Melanie Berry resides SNF at [facility]. Melanie Berry was d/c from hospice at the facility due to stability. Melanie Berry requires staff to turn and position her, total ADL's including bathing, dressing, incontinence bowel and bladder. Melanie Berry requires assistance with feeding herself.  I visited and observed Melanie Berry who sitting up in bed, crying both of her legs were hurting her. We reviewed pain management, just received dose Melanie Berry $RemoveBefor'15mg'QPWNVAsLTpmy$ . We talked about chronic pain at length, wounds  followed by wound care in the setting of Melanie Berry. Melanie Berry requested PC visit today for supportive, therapeutic listening, coping strategies. We talked about ros, symptoms, functional debility. We talked about mobility, quality of life,  residing at facility. We talked about appetite, foods, nutrition. We talked about pain, shortness of breath. We talked about coping strategies. We talked about medial goals, role pc in poc. Most pc visit supportive. Updated staff on plan.   ?  .  ?History obtained from review of EMR, discussion with facility staff and  Melanie Berry.  ?I reviewed available labs, medications, imaging, studies and related documents from the EMR.  Records reviewed and summarized above.  ? ?ROS ?10 point system reviewed all negative except HPI ? ?Physical Exam: ?Constitutional: NAD ?General: obese, debilitated, pleasant female ?EYES: anicteric sclera, lids intact ?ENMT: oral mucous membranes moist ?CV: S1S2, RRR, +BLE edema ?Pulmonary: LCTA, no increased work of breathing, +chronic cough, )2 ?Abdomen:  normo-active BS + 4 quadrants, soft and non tender ?MSK: bed-bound ?Skin: warm and dry ?Neuro:  + generalized weakness,  no cognitive impairment ?Psych: non-anxious affect, A and O x 3 ?Thank you for the opportunity to participate in the care of Melanie Berry.  The palliative care team will continue to follow. Please call our office at [phone] if we can be of additional assistance.  ? ?Keavon Sensing Z Yenesis Even, NP  ?  ?

## 2021-06-11 ENCOUNTER — Encounter: Payer: Self-pay | Admitting: Nurse Practitioner

## 2021-06-11 ENCOUNTER — Non-Acute Institutional Stay: Payer: Medicaid Other | Admitting: Nurse Practitioner

## 2021-06-11 VITALS — BP 130/70 | HR 72 | Temp 97.8°F | Resp 18 | Wt 278.0 lb

## 2021-06-11 DIAGNOSIS — G8929 Other chronic pain: Secondary | ICD-10-CM

## 2021-06-11 DIAGNOSIS — Z515 Encounter for palliative care: Secondary | ICD-10-CM

## 2021-06-11 DIAGNOSIS — R5381 Other malaise: Secondary | ICD-10-CM

## 2021-06-11 DIAGNOSIS — G600 Hereditary motor and sensory neuropathy: Secondary | ICD-10-CM

## 2021-06-11 NOTE — Progress Notes (Signed)
? ? ?Manufacturing engineer ?Community Palliative Care Consult Note ?Telephone: [phone]  ?Fax: [phone]  ? ? ?Date of encounter: 06/11/21 ?7:59 PM ?PATIENT NAME: Melanie Berry ?[address] ?[place] [address]   ?[phone] (home)  ?DOB: Aug 14, 1965 ?MRN: [id number] ?PRIMARY CARE PROVIDER:    ?[facility] ? ?RESPONSIBLE PARTY:    ?Contact Information   ? ? Name Relation Home Work Mobile  ? Melanie Berry, Melanie Berry   [phone]  ? Melanie Berry [phone]    ? ?  ? ?I met face to face with patient in facility. Palliative Care was asked to follow this patient by consultation request of  [place] to address advance care planning and complex medical decision making. This is a follow up visit.                                  ?ASSESSMENT AND PLAN / RECOMMENDATIONS:  ?Symptom Management/Plan: ?1. Advance Care Planning; DNR; continue with monitoring for decline, will revisit Hospice when meets eligibility. Was discharged from [facility] 01/12/2021. ?  ?2. Shortness of breath secondary to COPD, O2 dependent, continue to monitor respiration therapy, O2, inhalation therapy, discussed importance of being oob, helps with respiratory status.  ?  ?3. 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 Re-visited importance of mobility, oob, socialization.  ?  ?12/29/2020 weight 275.5 lbs ?01/21/2021 weight 278 lbs ?02/28/2021 weight 282 lbs ?03/23/2021 weight 280.7 lbs ?04/24/2021 weight 274.1 lbs ?05/23/2021 weight 278 lbs  ?4. Chronic pain secondary to debility, Valeda Malm Tooth; reviewed medications, ongoing pain, currently being managed with MsContin ER $RemoveBef'30mg'HpojrIKIPE$  q12 with $Remove'15mg'YwvEOaQ$  in afternoon. Fentanyl patch 10mcg q72 hrs. Continue to monitor on pain scale, consider decreasing dose as Melanie Berry has been on opiates for some time. We taked about pain regimen, worsening BLE edema with chronic ulcers, followed by wound care with una  boots; Discussed will continue with discussions with risk factors of obesity. Encouraged Melanie Berry to get oob, increase mobility. Melanie Berry verbalized understanding ? ?Follow up Palliative Care Visit: Palliative care will continue to follow for complex medical decision making, advance care planning, and clarification of goals. Return 4 weeks or prn. ? ?I spent 46 minutes providing this consultation. More than 50% of the time in this consultation was spent in counseling and care coordination. ?PPS: 30% ?Chief Complaint: Follow up palliative consult for complex medical decision making ? ?HISTORY OF PRESENT ILLNESS:  Melanie Berry is a 56 y.o. year old female  with multiple medical problems including morbid obesity, COPD, O2 dependence, chronic lower extremity venous stasis with venous stasis ulcers, muscular dystrophy, Charcot's arthropathy, chronic pain syndrome, seizure disorder, gerd, Melanie, insomnia, anxiety, depression, schizoaffective disorder with bipolar. Melanie Berry resides SNF at [facility]. Melanie Berry was d/c from hospice at the facility due to stability. Melanie Berry requires staff to turn and position her, total ADL's including bathing, dressing, incontinence bowel and bladder. Melanie Berry requires assistance with feeding herself.  I visited and observed Melanie Berry lying in bed, appears debilitated, chronically ill. [facility]anie Berry and I reviewed pain management. We talked about chronic pain at length, wounds followed by wound care in the setting of Marie Charot Tooth. Melanie Berry requested PC visit today for supportive, therapeutic listening, coping strategies. We talked about ros, symptoms, functional debility. We talked about mobility, importance of getting oob, wounds at  length with una boots, wound care plan reviewed, quality of life, residing at facility. We talked about appetite, foods, nutrition. We talked about pain, shortness of breath. We talked about coping strategies. We talked about medial  goals, role pc in poc. Most pc visit supportive. Updated staff on plan.   .  ? ?History obtained from review of EMR, discussion with facility staff and Melanie Berry.  ?I reviewed available labs, medications, imaging, studies and related documents from the EMR.  Records reviewed and summarized above.  ? ?ROS ?10 point system reviewed all negative except HPI ? ?Physical Exam: ?Constitutional: NAD ?General: obese, debilitated, pleasant female ?EYES: lids intact ?ENMT:oral mucous membranes moist ?CV: S1S2, RRR ?Lunge; Poor air movement, decrease throughout, clear,  ?Abdomen:  normo-active BS + 4 quadrants, soft and non tender ?MSK: bed-bound; functional quadriplegic ?Skin: warm and dry ?Neuro:  + generalized weakness,  no cognitive impairment ?Psych: non-anxious affect, A and O x 3 ?Thank you for the opportunity to participate in the care of Melanie Berry.  The palliative care team will continue to follow. Please call our office at [phone] if we can be of additional assistance.  ? ?Benisha Hadaway Z Malikye Reppond, NP  ?  ?

## 2021-07-09 ENCOUNTER — Encounter: Payer: Self-pay | Admitting: Nurse Practitioner

## 2021-07-09 ENCOUNTER — Non-Acute Institutional Stay: Payer: Medicaid Other | Admitting: Nurse Practitioner

## 2021-07-09 VITALS — BP 130/70 | HR 72 | Temp 97.3°F | Resp 18 | Wt 274.0 lb

## 2021-07-09 DIAGNOSIS — Z515 Encounter for palliative care: Secondary | ICD-10-CM

## 2021-07-09 DIAGNOSIS — G8929 Other chronic pain: Secondary | ICD-10-CM

## 2021-07-09 DIAGNOSIS — G600 Hereditary motor and sensory neuropathy: Secondary | ICD-10-CM

## 2021-07-09 DIAGNOSIS — R5381 Other malaise: Secondary | ICD-10-CM

## 2021-07-09 DIAGNOSIS — R0602 Shortness of breath: Secondary | ICD-10-CM

## 2021-07-09 NOTE — Progress Notes (Signed)
[facility] Palliative Care Consult Note Telephone: [phone]  Fax: [phone]    Date of encounter: 07/09/21 1:35 PM PATIENT NAME: Melanie. Berry [id number]   [phone] (home)  DOB: 18-May-1965 MRN: [id number] PRIMARY CARE PROVIDER:    [facility]  RESPONSIBLE PARTY:    Contact Information     Name Relation Home Work [place], [place] Spouse   [phone]   Melanie Berry [phone]        I met face to face with patient in facility. Palliative Care was asked to follow this patient by consultation request of  [place] to address advance care planning and complex medical decision making. This is a follow up visit.                                  ASSESSMENT AND PLAN / RECOMMENDATIONS:  Symptom Management/Plan: 1. Advance Care Planning; DNR; continue with monitoring for decline, will revisit Hospice when meets eligibility. Was discharged from [facility] 01/12/2021.   2. Shortness of breath secondary to COPD, O2 dependent, continue to monitor respiration therapy, O2, inhalation therapy, discussed importance of being oob, helps with respiratory status.    3. 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 [place] for being in the chair. Re-visited importance of mobility, oob, socialization.    12/29/2020 weight 275.5 lbs 01/21/2021 weight 278 lbs 02/28/2021 weight 282 lbs 03/23/2021 weight 280.7 lbs 04/24/2021 weight 274.1 lbs 05/23/2021 weight 278 lbs  06/22/2021 weight 274 lbs 4. Chronic pain secondary to debility, Melanie Berry; reviewed medications, ongoing pain, currently being managed with MsContin ER 59m q12 with 143min afternoon. Fentanyl patch [id number] hrs. Continue to monitor on pain scale, consider decreasing dose as Melanie. Melanie Berry been on opiates for some time. We taked about pain regimen, worsening BLE edema with chronic ulcers, followed  by wound care with una boots; Discussed will continue with discussions with risk factors of obesity. Encouraged Melanie. Melanie Berry to get oob, increase mobility. Melanie. Melanie Berry understanding   Follow up Palliative Care Visit: Palliative care will continue to follow for complex medical decision making, advance care planning, and clarification of goals. Return 4 weeks or prn.   I spent 46 minutes providing this consultation starting at 12:30 pm. More than 50% of the time in this consultation was spent in counseling and care coordination. PPS: 30% Chief Complaint: Follow up palliative consult for complex medical decision making   HISTORY OF PRESENT ILLNESS:  Melanie Berry a 56 35o. year old female  with multiple medical problems including morbid obesity, COPD, O2 dependence, chronic lower extremity venous stasis with venous stasis ulcers, muscular dystrophy, Charcot's arthropathy, chronic pain syndrome, seizure disorder, gerd, IDA, insomnia, anxiety, depression, schizoaffective disorder with bipolar. [facility] Berry SNF at [facility]. Berry d/c from hospice at the facility due to stability. [facility] staff to turn and position her, total ADL's including bathing, dressing, incontinence bowel and bladder. Melanie Berry assistance with feeding herself.  I visited and observed Melanie Berry was lying in the recliner in the dining room. Melanie Berry debilitated, pale, smiling. [facility] PC visit today for support as she was tearful intermit visit as it will be 1 year since her husband has passed away, therapeutic listening, coping strategies. Melanie Berry I reviewed  pain management. We talked about appetite, foods, nutrition. Melanie Berry endorses she has lost a little amount of weight. Discussed loosing weight will help her breath better, easier for mobility. We talked about pain, shortness of breath. We talked about ros, symptoms, functional debility. We talked  about mobility, importance of getting oob, quality of life, residing at facility. Praised Melanie Berry for getting oob, going to dining area, being out of her room, positive motivation. We talked about medial goals, role pc in poc. Most pc visit supportive. Updated staff on plan.   Marland Kitchen    History obtained from review of EMR, discussion with facility staff and Melanie Berry.  I reviewed available labs, medications, imaging, studies and related documents from the EMR.  Records reviewed and summarized above.    ROS 10 point system reviewed all negative except HPI   Physical Exam: Constitutional: NAD General: obese, debilitated, pleasant female EYES: lids intact ENMT:oral mucous membranes moist CV: S1S2, RRR Lunge; Poor air movement, decrease throughout, clear,  Abdomen:  normo-active BS + 4 quadrants, soft and non tender MSK: bed-bound; functional quadriplegic Skin: warm and dry Neuro:  + generalized weakness,  no cognitive impairment Psych: non-anxious affect, A and O x 3 Thank you for the opportunity to participate in the care of Melanie Berry.  The palliative care team will continue to follow. Please call our office at [phone] if we can be of additional assistance.    Iara Monds Ihor Gully, NP

## 2021-09-02 DIAGNOSIS — G894 Chronic pain syndrome: Secondary | ICD-10-CM | POA: Insufficient documentation

## 2021-09-02 DIAGNOSIS — M899 Disorder of bone, unspecified: Secondary | ICD-10-CM | POA: Insufficient documentation

## 2021-09-02 DIAGNOSIS — Z79899 Other long term (current) drug therapy: Secondary | ICD-10-CM | POA: Insufficient documentation

## 2021-09-02 DIAGNOSIS — Z789 Other specified health status: Secondary | ICD-10-CM | POA: Insufficient documentation

## 2021-09-02 NOTE — Progress Notes (Unsigned)
Patient: Melanie Berry  Service Category: E/M  Provider: Gaspar Cola, MD  DOB: 1965-12-20  DOS: 09/05/2021  Referring Provider: Demetrius Charity, MD  MRN: [id number]  Setting: Ambulatory outpatient  PCP: Marden Noble, MD  Type: New Patient  Specialty: Interventional Pain Management    Location: Office  Delivery: Face-to-face     Primary Reason(s) for Visit: Encounter for initial evaluation of one or more chronic problems (new to examiner) potentially causing chronic pain, and posing a threat to normal musculoskeletal function. (Level of risk: High) CC: No chief complaint on file.  HPI  Ms. Sek is a 56 y.o. year old, female patient, who comes for the first time to our practice referred by Demetrius Charity, MD for our initial evaluation of her chronic pain. She has PSEUDOMEMBRANOUS COLITIS; CANDIDIASIS; Hyperlipidemia; HYPOKALEMIA; TOBACCO ABUSE; Depression; CHARCOT-MARIE-TOOTH DISEASE; Venous (peripheral) insufficiency; ALLERGIC RHINITIS; DISORDER, TOOTH DEVELOPMENT/ERUPTION NOS; TONGUE DISORDER; GERD; CYSTITIS, ACUTE; LOW BACK PAIN; SLEEP APNEA; LEG EDEMA; URINARY INCONTINENCE; STATUS, ARTHRODESIS; Pneumonia; Venous ulcers of both lower extremities ([place]); Sepsis ([place]); Fall at home, initial encounter; Cellulitis of right leg; Obesity, Class III, BMI 40-49.9 (morbid obesity) ([place]); Fracture of femoral neck, right, closed ([place]); Closed right hip fracture (H[facility]); COPD (chronic obstructive pulmonary disease) ([place]); Goals of care, counseling/discussion; Palliative care by specialist; Anemia; Dystrophia unguium; Generalized osteoarthrosis; Vitamin D deficiency; Charcot Marie Tooth muscular atrophy; Tobacco use; Asthma; Pharmacologic therapy; Disorder of skeletal system; Problems influencing health status; and Chronic pain syndrome on their problem list. Today she comes in for evaluation of her No chief complaint on file.  Pain Assessment: Location:     Radiating:   Onset:   Duration:   Quality:    Severity:  /10 (subjective, self-reported pain score)  Effect on ADL:   Timing:   Modifying factors:   BP:    HR:    Onset and Duration: {Hx; Onset and Duration:210120511} Cause of pain: {Hx; Cause:210120521} Severity: {Pain Severity:210120502} Timing: {Symptoms; Timing:210120501} Aggravating Factors: {Causes; Aggravating pain factors:210120507} Alleviating Factors: {Causes; Alleviating Factors:210120500} Associated Problems: {Hx; Associated problems:210120515} Quality of Pain: {Hx; Symptom quality or Descriptor:210120531} Previous Examinations or Tests: {Hx; Previous examinations or test:210120529} Previous Treatments: {Hx; Previous Treatment:210120503}  ***  Today I took the time to provide the patient with information regarding my pain practice. The patient was informed that my practice is divided into two sections: an interventional pain management section, as well as a completely separate and distinct medication management section. I explained that I have procedure days for my interventional therapies, and evaluation days for follow-ups and medication management. Because of the amount of documentation required during both, they are kept separated. This means that there is the possibility that she may be scheduled for a procedure on one day, and medication management the next. I have also informed her that because of staffing and facility limitations, I no longer take patients for medication management only. To illustrate the reasons for this, I gave the patient the example of surgeons, and how inappropriate it would be to refer a patient to his/her care, just to write for the post-surgical antibiotics on a surgery done by a different surgeon.   Because interventional pain management is my board-certified specialty, the patient was informed that joining my practice means that they are open to any and all interventional therapies. I made it clear that this does not mean that they will be  forced to have any procedures done. What this means is that I believe interventional therapies  to be essential part of the diagnosis and proper management of chronic pain conditions. Therefore, patients not interested in these interventional alternatives will be better served under the care of a different practitioner.  The patient was also made aware of my Comprehensive Pain Management Safety Guidelines where by joining my practice, they limit all of their nerve blocks and joint injections to those done by our practice, for as long as we are retained to manage their care.   Historic Controlled Substance Pharmacotherapy Review  PMP and historical list of controlled substances: ***  Current opioid analgesics:   *** MME/day: *** mg/day  Historical Monitoring: The patient  reports no history of drug use. List of all UDS Test(s): Lab Results  Component Value Date   MDMA NEGATIVE 08/18/2012   MDMA NEGATIVE 10/05/2011   COCAINSCRNUR NEGATIVE 08/18/2012   COCAINSCRNUR NEGATIVE 10/05/2011   COCAINSCRNUR NEG 01/16/2006   PCPSCRNUR NEGATIVE 08/18/2012   PCPSCRNUR NEGATIVE 10/05/2011   PCPSCRNUR NEG 01/16/2006   THCU NEGATIVE 08/18/2012   THCU NEGATIVE 10/05/2011   List of other Serum/Urine Drug Screening Test(s):  Lab Results  Component Value Date   COCAINSCRNUR NEGATIVE 08/18/2012   COCAINSCRNUR NEGATIVE 10/05/2011   COCAINSCRNUR NEG 01/16/2006   THCU NEGATIVE 08/18/2012   THCU NEGATIVE 10/05/2011   Historical Background Evaluation: [place] PMP: PDMP reviewed during this encounter. Review of the past 33-months conducted.             PMP NARX Score Report:  Narcotic: *** Sedative: *** Stimulant: *** [place] Department of public safety, offender search: Editor, commissioning Information) Non-contributory Risk Assessment Profile: Aberrant behavior: None observed or detected today Risk factors for fatal opioid overdose: None identified today PMP NARX Overdose Risk Score: *** Fatal overdose hazard ratio (HR):  Calculation deferred Non-fatal overdose hazard ratio (HR): Calculation deferred Risk of opioid abuse or dependence: 0.7-3.0% with doses ? 36 MME/day and 6.1-26% with doses ? 120 MME/day. Substance use disorder (SUD) risk level: See below Personal History of Substance Abuse (SUD-Substance use disorder):  Alcohol:    Illegal Drugs:    Rx Drugs:    ORT Risk Level calculation:    ORT Scoring interpretation table:  Score <3 = Low Risk for SUD  Score between 4-7 = Moderate Risk for SUD  Score >8 = High Risk for Opioid Abuse   PHQ-2 Depression Scale:  Total score:    PHQ-2 Scoring interpretation table: (Score and probability of major depressive disorder)  Score 0 = No depression  Score 1 = 15.4% Probability  Score 2 = 21.1% Probability  Score 3 = 38.4% Probability  Score 4 = 45.5% Probability  Score 5 = 56.4% Probability  Score 6 = 78.6% Probability   PHQ-9 Depression Scale:  Total score:    PHQ-9 Scoring interpretation table:  Score 0-4 = No depression  Score 5-9 = Mild depression  Score 10-14 = Moderate depression  Score 15-19 = Moderately severe depression  Score 20-27 = Severe depression (2.4 times higher risk of SUD and 2.89 times higher risk of overuse)   Pharmacologic Plan: As per protocol, I have not taken over any controlled substance management, pending the results of ordered tests and/or consults.            Initial impression: Pending review of available data and ordered tests.  Meds   Current Outpatient Medications:    acetaminophen (TYLENOL) 325 MG tablet, Take 650 mg by mouth every 4 (four) hours as needed for mild pain or moderate pain. , Disp: ,  Rfl:    ammonium lactate (AMLACTIN) 12 % cream, Apply 1 g topically 2 (two) times daily. (apply to feet), Disp: , Rfl:    ARIPiprazole (ABILIFY) 2 MG tablet, Take 12 mg by mouth daily. , Disp: , Rfl:    azelastine (ASTELIN) 0.1 % nasal spray, Place 2 sprays into both nostrils 2 (two) times daily. Use in each nostril as  directed, Disp: , Rfl:    ciclopirox (LOPROX) 0.77 % cream, Apply topically 2 (two) times daily. Apply bilateral toenails topically every day shift for toenail fungus, Disp: , Rfl:    clonazePAM (KLONOPIN) 0.5 MG tablet, Take 1 mg by mouth 3 (three) times daily. , Disp: , Rfl:    clonazePAM (KLONOPIN) 0.5 MG tablet, Take 0.5 mg by mouth daily as needed for anxiety., Disp: , Rfl:    Dextran 70-Hypromellose (NATURES TEARS OP), Place 1 drop into both eyes as needed (dry eys). (0.4%), Disp: , Rfl:    divalproex (DEPAKOTE) 125 MG DR tablet, Take 125 mg by mouth 2 (two) times daily., Disp: , Rfl:    fluticasone (FLONASE) 50 MCG/ACT nasal spray, Place 1 spray into both nostrils daily. , Disp: , Rfl:    furosemide (LASIX) 20 MG tablet, Take 30 mg by mouth 2 (two) times daily. , Disp: , Rfl:    lubiprostone (AMITIZA) 24 MCG capsule, Take 24 mcg by mouth daily with breakfast., Disp: , Rfl:    melatonin 5 MG TABS, Take 10 mg by mouth at bedtime. , Disp: , Rfl:    meloxicam (MOBIC) 7.5 MG tablet, Take 7.5 mg by mouth daily., Disp: , Rfl:    Menthol, Topical Analgesic, (BIOFREEZE EX), Apply 1 application topically every 8 (eight) hours as needed. (apply to left shoulder), Disp: , Rfl:    menthol-cetylpyridinium (CEPACOL) 3 MG lozenge, Take 1 lozenge by mouth every hour as needed for sore throat., Disp: , Rfl:    miconazole nitrate (MICATIN) POWD, Apply 1 application topically 2 (two) times daily. (apply under breasts and to armpits), Disp: , Rfl:    morphine (MS CONTIN) 30 MG 12 hr tablet, Take 30 mg by mouth every 12 (twelve) hours., Disp: , Rfl:    Morphine Sulfate (MORPHINE CONCENTRATE) 10 mg / 0.5 ml concentrated solution, Take 10 mg by mouth every 2 (two) hours as needed for severe pain or shortness of breath., Disp: , Rfl:    ondansetron (ZOFRAN) 4 MG tablet, Take 4 mg by mouth every 8 (eight) hours as needed for nausea or vomiting., Disp: , Rfl:    potassium chloride SA (K-DUR,KLOR-CON) 20 MEQ tablet,  Take 40 mEq by mouth daily., Disp: , Rfl:    senna (SENOKOT) 8.6 MG tablet, Take 2 tablets by mouth 2 (two) times daily. , Disp: , Rfl:    sodium chloride (OCEAN) 0.65 % SOLN nasal spray, Place 2 sprays into both nostrils 3 (three) times daily., Disp: , Rfl:    traZODone (DESYREL) 50 MG tablet, Take 50 mg by mouth at bedtime. , Disp: , Rfl:    venlafaxine XR (EFFEXOR-XR) 150 MG 24 hr capsule, Take 150 mg by mouth daily with breakfast., Disp: , Rfl:   Imaging Review  Cervical Imaging: Cervical MR wo contrast: No results found for this or any previous visit.  Cervical MR wo contrast: No valid procedures specified. Cervical MR w/wo contrast: No results found for this or any previous visit.  Cervical MR w contrast: No results found for this or any previous visit.  Cervical CT wo  contrast: No results found for this or any previous visit.  Cervical CT w/wo contrast: No results found for this or any previous visit.  Cervical CT w/wo contrast: No results found for this or any previous visit.  Cervical CT w contrast: No results found for this or any previous visit.  Cervical CT outside: No results found for this or any previous visit.  Cervical DG 1 view: No results found for this or any previous visit.  Cervical DG 2-3 views: No results found for this or any previous visit.  Cervical DG F/E views: No results found for this or any previous visit.  Cervical DG 2-3 clearing views: No results found for this or any previous visit.  Cervical DG Bending/F/E views: No results found for this or any previous visit.  Cervical DG complete: No results found for this or any previous visit.  Cervical DG Myelogram views: No results found for this or any previous visit.  Cervical DG Myelogram views: No results found for this or any previous visit.  Cervical Discogram views: No results found for this or any previous visit.   Shoulder Imaging: Shoulder-R MR w contrast: No results found for this or  any previous visit.  Shoulder-L MR w contrast: No results found for this or any previous visit.  Shoulder-R MR w/wo contrast: No results found for this or any previous visit.  Shoulder-L MR w/wo contrast: No results found for this or any previous visit.  Shoulder-R MR wo contrast: No results found for this or any previous visit.  Shoulder-L MR wo contrast: No results found for this or any previous visit.  Shoulder-R CT w contrast: No results found for this or any previous visit.  Shoulder-L CT w contrast: No results found for this or any previous visit.  Shoulder-R CT w/wo contrast: No results found for this or any previous visit.  Shoulder-L CT w/wo contrast: No results found for this or any previous visit.  Shoulder-R CT wo contrast: No results found for this or any previous visit.  Shoulder-L CT wo contrast: No results found for this or any previous visit.  Shoulder-R DG Arthrogram: No results found for this or any previous visit.  Shoulder-L DG Arthrogram: No results found for this or any previous visit.  Shoulder-R DG 1 view: No results found for this or any previous visit.  Shoulder-L DG 1 view: No results found for this or any previous visit.  Shoulder-R DG: No results found for this or any previous visit.  Shoulder-L DG: Results for orders placed during the hospital encounter of 04/28/15  DG Shoulder Left  Narrative CLINICAL DATA:  Left shoulder pain for 1 week. Heard a pop 2 days prior.  EXAM: LEFT SHOULDER - 2+ VIEW  COMPARISON:  None.  FINDINGS: No fracture. No evidence of dislocation, lack of axillary view limits assessment. Proliferative change at the acromioclavicular joint. No abnormal soft tissue calcifications.  IMPRESSION: No acute fracture. No evidence of dislocation. Mild AC joint degenerative change.   Electronically Signed By: Jeb Levering M.D. On: 04/28/2015 01:20   Thoracic Imaging: Thoracic MR wo contrast: No results found  for this or any previous visit.  Thoracic MR wo contrast: No valid procedures specified. Thoracic MR w/wo contrast: Results for orders placed in visit on 06/26/02  MR Thoracic Spine W Wo Contrast  Narrative FINDINGS CLINICAL DATA:   SEVERE LOW BACK PAIN.  LEFT RADICULOPATHY.  LEG PAIN AND SWELLING.  DIFFICULTY STANDING AND WALKING AND HISTORY OF MUSCULAR DYSTROPHY. MR  OF THE THORACIC SPINE WITH AND WITHOUT CONTRAST, 06/26/02 MULTIPLANAR IMAGING WAS OBTAINED OF THE THORACIC SPINE ON A 1.5 TESLA MAGNET.  IMAGES WERE OBTAINED PRE AND POST ADMINISTRATION IV GADOLINIUM.  THERE ARE NO PRIOR STUDIES AVAILABLE FOR COMPARISON. THERE IS NO EVIDENCE OF CHIARI MALFORMATION OR SYRINX.  THE CORD SIGNAL IS WITHIN NORMAL LIMITS. THERE IS NO EVIDENCE OF PATHOLOGIC CONTRAST ENHANCEMENT.  MILD BROAD-BASED DISC BULGE IS NOTED AT THE C5-6 LEVEL.  THIS IS NOT CAUSING SIGNIFICANT NEURAL FORAMINAL OR SPINAL CANAL STENOSIS. IMPRESSION SMALL BROAD-BASED DISC BULGE AT THE C5-6 LEVEL.  OTHERWISE, NEGATIVE STUDY. MR LUMBAR SPINE WITH AND WITHOUT CONTRAST, 06/26/02 MULTIPLANAR IMAGING WAS OBTAINED OF THE LUMBAR SPINE PRE AND POST ADMINISTRATION IV GADOLINIUM. THE IMAGING WAS PERFORMED ON A 1.5 TESLA MAGNET.  THERE ARE NO PRIOR STUDIES AVAILABLE FOR COMPARISON. MARROW SIGNAL AND ALIGNMENT ARE WITHIN NORMAL LIMITS.  THE CONUS ENDS AT THE APPROPRIATE LEVEL.  NO PATHOLOGIC CONTRAST ENHANCEMENT IS DEMONSTRATED.  MILD MULTILEVEL ARTHROPATHY IS DEMONSTRATED. BROAD-BASED DISC BULGES ARE NOTED AT THE L3-4 AND L4-5 LEVELS.  THIS IS NOT RESULTING IN SIGNIFICANT NEURAL FORAMINAL OR SPINAL CANAL STENOSIS. IMPRESSION 1.  MILD MULTILEVEL SPONDYLOSIS. 2.  NO EVIDENCE OF PATHOLOGICAL ENHANCEMENT.  Thoracic MR w contrast: No results found for this or any previous visit.  Thoracic CT wo contrast: No results found for this or any previous visit.  Thoracic CT w/wo contrast: No results found for this or any previous visit.  Thoracic  CT w/wo contrast: No results found for this or any previous visit.  Thoracic CT w contrast: No results found for this or any previous visit.  Thoracic DG 2-3 views: No results found for this or any previous visit.  Thoracic DG 4 views: No results found for this or any previous visit.  Thoracic DG: No results found for this or any previous visit.  Thoracic DG w/swimmers view: No results found for this or any previous visit.  Thoracic DG Myelogram views: No results found for this or any previous visit.  Thoracic DG Myelogram views: No results found for this or any previous visit.   Lumbosacral Imaging: Lumbar MR wo contrast: No results found for this or any previous visit.  Lumbar MR wo contrast: No valid procedures specified. Lumbar MR w/wo contrast: Results for orders placed in visit on 06/26/02  MR Lumbar Spine W Wo Contrast  Narrative FINDINGS CLINICAL DATA:   SEVERE LOW BACK PAIN.  LEFT RADICULOPATHY.  LEG PAIN AND SWELLING.  DIFFICULTY STANDING AND WALKING AND HISTORY OF MUSCULAR DYSTROPHY. MR OF THE THORACIC SPINE WITH AND WITHOUT CONTRAST, 06/26/02 MULTIPLANAR IMAGING WAS OBTAINED OF THE THORACIC SPINE ON A 1.5 TESLA MAGNET.  IMAGES WERE OBTAINED PRE AND POST ADMINISTRATION IV GADOLINIUM.  THERE ARE NO PRIOR STUDIES AVAILABLE FOR COMPARISON. THERE IS NO EVIDENCE OF CHIARI MALFORMATION OR SYRINX.  THE CORD SIGNAL IS WITHIN NORMAL LIMITS. THERE IS NO EVIDENCE OF PATHOLOGIC CONTRAST ENHANCEMENT.  MILD BROAD-BASED DISC BULGE IS NOTED AT THE C5-6 LEVEL.  THIS IS NOT CAUSING SIGNIFICANT NEURAL FORAMINAL OR SPINAL CANAL STENOSIS. IMPRESSION SMALL BROAD-BASED DISC BULGE AT THE C5-6 LEVEL.  OTHERWISE, NEGATIVE STUDY. MR LUMBAR SPINE WITH AND WITHOUT CONTRAST, 06/26/02 MULTIPLANAR IMAGING WAS OBTAINED OF THE LUMBAR SPINE PRE AND POST ADMINISTRATION IV GADOLINIUM. THE IMAGING WAS PERFORMED ON A 1.5 TESLA MAGNET.  THERE ARE NO PRIOR STUDIES AVAILABLE FOR COMPARISON. MARROW SIGNAL AND  ALIGNMENT ARE WITHIN NORMAL LIMITS.  THE CONUS ENDS AT THE APPROPRIATE LEVEL.  NO PATHOLOGIC CONTRAST ENHANCEMENT  IS DEMONSTRATED.  MILD MULTILEVEL ARTHROPATHY IS DEMONSTRATED. BROAD-BASED DISC BULGES ARE NOTED AT THE L3-4 AND L4-5 LEVELS.  THIS IS NOT RESULTING IN SIGNIFICANT NEURAL FORAMINAL OR SPINAL CANAL STENOSIS. IMPRESSION 1.  MILD MULTILEVEL SPONDYLOSIS. 2.  NO EVIDENCE OF PATHOLOGICAL ENHANCEMENT.  Lumbar MR w/wo contrast: No results found for this or any previous visit.  Lumbar MR w contrast: No results found for this or any previous visit.  Lumbar CT wo contrast: No results found for this or any previous visit.  Lumbar CT w/wo contrast: No results found for this or any previous visit.  Lumbar CT w/wo contrast: No results found for this or any previous visit.  Lumbar CT w contrast: No results found for this or any previous visit.  Lumbar DG 1V: No results found for this or any previous visit.  Lumbar DG 1V (Clearing): No results found for this or any previous visit.  Lumbar DG 2-3V (Clearing): No results found for this or any previous visit.  Lumbar DG 2-3 views: No results found for this or any previous visit.  Lumbar DG (Complete) 4+V: No results found for this or any previous visit.        Lumbar DG F/E views: No results found for this or any previous visit.        Lumbar DG Bending views: No results found for this or any previous visit.        Lumbar DG Myelogram views: No results found for this or any previous visit.  Lumbar DG Myelogram: No results found for this or any previous visit.  Lumbar DG Myelogram: No results found for this or any previous visit.  Lumbar DG Myelogram: No results found for this or any previous visit.  Lumbar DG Myelogram Lumbosacral: No results found for this or any previous visit.  Lumbar DG Diskogram views: No results found for this or any previous visit.  Lumbar DG Diskogram views: No results found for this or any previous  visit.  Lumbar DG Epidurogram OP: No results found for this or any previous visit.  Lumbar DG Epidurogram IP: No valid procedures specified.  Sacroiliac Joint Imaging: Sacroiliac Joint DG: No results found for this or any previous visit.  Sacroiliac Joint MR w/wo contrast: No results found for this or any previous visit.  Sacroiliac Joint MR wo contrast: No results found for this or any previous visit.   Spine Imaging: Whole Spine DG Myelogram views: No results found for this or any previous visit.  Whole Spine MR Mets screen: No results found for this or any previous visit.  Whole Spine MR Mets screen: No results found for this or any previous visit.  Whole Spine MR w/wo: No results found for this or any previous visit.  MRA Spinal Canal w/ cm: No results found for this or any previous visit.  MRA Spinal Canal wo/ cm: No valid procedures specified. MRA Spinal Canal w/wo cm: No results found for this or any previous visit.  Spine Outside MR Films: No results found for this or any previous visit.  Spine Outside CT Films: No results found for this or any previous visit.  CT-Guided Biopsy: No results found for this or any previous visit.  CT-Guided Needle Placement: No results found for this or any previous visit.  DG Spine outside: No results found for this or any previous visit.  IR Spine outside: No results found for this or any previous visit.  NM Spine outside: No results found for this or any  previous visit.   Hip Imaging: Hip-R MR w contrast: No results found for this or any previous visit.  Hip-L MR w contrast: No results found for this or any previous visit.  Hip-R MR w/wo contrast: No results found for this or any previous visit.  Hip-L MR w/wo contrast: No results found for this or any previous visit.  Hip-R MR wo contrast: No results found for this or any previous visit.  Hip-L MR wo contrast: No results found for this or any previous visit.  Hip-R CT  w contrast: No results found for this or any previous visit.  Hip-L CT w contrast: No results found for this or any previous visit.  Hip-R CT w/wo contrast: No results found for this or any previous visit.  Hip-L CT w/wo contrast: No results found for this or any previous visit.  Hip-R CT wo contrast: No results found for this or any previous visit.  Hip-L CT wo contrast: No results found for this or any previous visit.  Hip-R DG 2-3 views: No results found for this or any previous visit.  Hip-L DG 2-3 views: No results found for this or any previous visit.  Hip-R DG Arthrogram: No results found for this or any previous visit.  Hip-L DG Arthrogram: No results found for this or any previous visit.  Hip-B DG Bilateral: No results found for this or any previous visit.   Knee Imaging: Knee-R MR w contrast: No results found for this or any previous visit.  Knee-L MR w/o contrast: No results found for this or any previous visit.  Knee-R MR w/wo contrast: No results found for this or any previous visit.  Knee-L MR w/wo contrast: No results found for this or any previous visit.  Knee-R MR wo contrast: No results found for this or any previous visit.  Knee-L MR wo contrast: No results found for this or any previous visit.  Knee-R CT w contrast: No results found for this or any previous visit.  Knee-L CT w contrast: No results found for this or any previous visit.  Knee-R CT w/wo contrast: No results found for this or any previous visit.  Knee-L CT w/wo contrast: No results found for this or any previous visit.  Knee-R CT wo contrast: No results found for this or any previous visit.  Knee-L CT wo contrast: No results found for this or any previous visit.  Knee-R DG 1-2 views: No results found for this or any previous visit.  Knee-L DG 1-2 views: No results found for this or any previous visit.  Knee-R DG 3 views: No results found for this or any previous visit.  Knee-L DG  3 views: No results found for this or any previous visit.  Knee-R DG 4 views: No results found for this or any previous visit.  Knee-L DG 4 views: No results found for this or any previous visit.  Knee-R DG Arthrogram: No results found for this or any previous visit.  Knee-L DG Arthrogram: No results found for this or any previous visit.   Ankle Imaging: Ankle-R DG Complete: No results found for this or any previous visit.  Ankle-L DG Complete: No results found for this or any previous visit.   Foot Imaging: Foot-R DG Complete: No results found for this or any previous visit.  Foot-L DG Complete: Results for orders placed in visit on 06/25/02  DG Foot Complete Left  Narrative FINDINGS CLINICAL DATA:  PAIN AND SWELLING OF THE LEFT FOOT. LEFT FOOT (  THREE VIEWS): THE PATIENT HAS HAD MIDFOOT ARTHRODESIS.  THREE STAPLES ARE IN PLACE.  FUSION IN THAT REGION APPEARS GROSSLY SOLID AND THERE IS NO PLAIN RADIOGRAPHIC EVIDENCE OF COMPLICATION SUCH AS OSTEOLYSIS TO SUGGEST INFECTION.  THERE ARE DEGENERATIVE CHANGES AT THE ANKLE JOINT ITSELF AND AT THE ARTICULATIONS BETWEEN THE FOREFOOT AND THE MID FOOT. IMPRESSION 1.  MIDFOOT FUSION WITHOUT APPARENT COMPLICATION. 2.  OSTEOARTHRITIS OF THE ANKLE JOINT ITSELF.   Elbow Imaging: Elbow-R DG Complete: No results found for this or any previous visit.  Elbow-L DG Complete: No results found for this or any previous visit.   Wrist Imaging: Wrist-R DG Complete: No results found for this or any previous visit.  Wrist-L DG Complete: No results found for this or any previous visit.   Hand Imaging: Hand-R DG Complete: No results found for this or any previous visit.  Hand-L DG Complete: No results found for this or any previous visit.   Complexity Note: Imaging results reviewed. Results shared with Ms. Weiland, using Layman's terms.                         ROS  Cardiovascular: {Hx; Cardiovascular History:210120525} Pulmonary or  Respiratory: {Hx; Pumonary and/or Respiratory History:210120523} Neurological: {Hx; Neurological:210120504} Psychological-Psychiatric: {Hx; Psychological-Psychiatric History:210120512} Gastrointestinal: {Hx; Gastrointestinal:210120527} Genitourinary: {Hx; Genitourinary:210120506} Hematological: {Hx; Hematological:210120510} Endocrine: {Hx; Endocrine history:210120509} Rheumatologic: {Hx; Rheumatological:210120530} Musculoskeletal: {Hx; Musculoskeletal:210120528} Work History: {Hx; Work history:210120514}  Allergies  Ms. Odeh is allergic to pregabalin, shellfish-derived products, aspirin, clarithromycin, codeine, contrast media [iodinated contrast media], fentanyl, gabapentin, methocarbamol, nicotine, nortriptyline hcl, nortriptyline hcl, potassium iodide, propoxyphene n-acetaminophen, sulfonamide derivatives, and tramadol hcl.  Laboratory Chemistry Profile   Renal Lab Results  Component Value Date   BUN 10 05/17/2019   CREATININE 0.41 (L) 05/17/2019   GFRAA >60 05/17/2019   GFRNONAA >60 05/17/2019   PROTEINUR NEGATIVE 05/11/2019     Electrolytes Lab Results  Component Value Date   NA 140 05/17/2019   K 3.3 (L) 05/17/2019   CL 100 05/17/2019   CALCIUM 8.4 (L) 05/17/2019   MG 1.8 03/13/2013     Hepatic Lab Results  Component Value Date   AST 33 05/11/2019   ALT 19 05/11/2019   ALBUMIN 3.4 (L) 05/11/2019   ALKPHOS 86 05/11/2019   LIPASE 12 12/10/2015     ID Lab Results  Component Value Date   HIV NON REACTIVE 05/12/2019   SARSCOV2NAA NEGATIVE 05/11/2019   MRSAPCR POSITIVE (A) 05/13/2019     Bone No results found for: "VD25OH", "VD125OH2TOT", "NW2956OZ3", "YQ6578IO9", "25OHVITD1", "25OHVITD2", "62XBMWUX3", "TESTOFREE", "TESTOSTERONE"   Endocrine Lab Results  Component Value Date   GLUCOSE 86 05/17/2019   GLUCOSEU NEGATIVE 05/11/2019   TSH 2.87 08/18/2012     Neuropathy Lab Results  Component Value Date   HIV NON REACTIVE 05/12/2019     CNS No results  found for: "COLORCSF", "APPEARCSF", "RBCCOUNTCSF", "WBCCSF", "POLYSCSF", "LYMPHSCSF", "EOSCSF", "PROTEINCSF", "GLUCCSF", "JCVIRUS", "CSFOLI", "IGGCSF", "LABACHR", "ACETBL"   Inflammation (CRP: Acute  ESR: Chronic) Lab Results  Component Value Date   LATICACIDVEN 0.9 05/12/2019     Rheumatology No results found for: "RF", "ANA", "LABURIC", "URICUR", "LYMEIGGIGMAB", "LYMEABIGMQN", "HLAB27"   Coagulation Lab Results  Component Value Date   INR 1.0 05/11/2019   LABPROT 13.3 05/11/2019   APTT 35.4 05/28/2012   PLT 253 05/17/2019     Cardiovascular Lab Results  Component Value Date   BNP 43.0 05/13/2019   CKTOTAL 61 07/02/2012   TROPONINI <0.03 11/15/2015   HGB  7.5 (L) 05/17/2019   HCT 24.1 (L) 05/17/2019     Screening Lab Results  Component Value Date   SARSCOV2NAA NEGATIVE 05/11/2019   MRSAPCR POSITIVE (A) 05/13/2019   HIV NON REACTIVE 05/12/2019     Cancer No results found for: "CEA", "CA125", "LABCA2"   Allergens No results found for: "ALMOND", "APPLE", "ASPARAGUS", "AVOCADO", "BANANA", "BARLEY", "BASIL", "BAYLEAF", "GREENBEAN", "LIMABEAN", "WHITEBEAN", "BEEFIGE", "REDBEET", "BLUEBERRY", "BROCCOLI", "CABBAGE", "MELON", "CARROT", "CASEIN", "CASHEWNUT", "CAULIFLOWER", "CELERY"     Note: Lab results reviewed.  [place]  Drug: Ms. Liberati  reports no history of drug use. Alcohol:  reports no history of alcohol use. Tobacco:  reports that she has quit smoking. She has never used smokeless tobacco. Medical:  has a past medical history of Anemia, Charcot's arthropathy, COPD (chronic obstructive pulmonary disease) (HCC), COPD (chronic obstructive pulmonary disease) (HCC), Fluid overload, GERD (gastroesophageal reflux disease), and MDD (major depressive disorder). Family: Family history is unknown by patient.  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   Venous (peripheral) insufficiency 01/06/2006   ALLERGIC RHINITIS 01/06/2006   DISORDER, TOOTH DEVELOPMENT/ERUPTION NOS 01/06/2006   TONGUE DISORDER 01/06/2006   GERD 01/06/2006   CYSTITIS, ACUTE 05/13/2006   LOW BACK PAIN 01/06/2006   SLEEP APNEA 01/06/2006   LEG EDEMA 01/06/2006   URINARY INCONTINENCE 07/22/2006   STATUS, ARTHRODESIS 01/06/2006   Pneumonia 11/15/2015   Venous ulcers of both lower extremities ([place]) 01/05/2018   Sepsis ([place]) 05/12/2019   Fall at home, initial encounter 05/12/2019   Cellulitis of right leg 05/12/2019   Obesity, Class III, BMI 40-49.9 (morbid obesity) ([place]) 05/12/2019   Fracture of femoral neck, right, closed ([place]) 05/12/2019   Closed right hip fracture ([place]) 05/12/2019   COPD (chronic obstructive pulmonary disease) ([place]) 10/18/2009   Goals of care, counseling/discussion    Palliative care by specialist    Anemia 09/27/2016   Dystrophia unguium 07/14/2017   Generalized osteoarthrosis 05/11/2007   Vitamin D deficiency 05/15/2007   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 In no apparent acute distress There were no vitals filed for this visit. BMI Assessment: Estimated body  mass index is 47.03 kg/m as calculated from the following:   Height as of 05/11/19: $RemoveBef'5\' 4"'nwSaJQUbrq$  (1.626 m).   Weight as of 07/09/21: 274 lb (124.3 kg).  BMI interpretation table: BMI level Category Range association with higher incidence of chronic pain  <18 kg/m2 Underweight   18.5-24.9 kg/m2 Ideal body weight   25-29.9 kg/m2 Overweight Increased incidence by 20%  30-34.9 kg/m2 Obese (Class I) Increased incidence by 68%  35-39.9 kg/m2 Severe obesity (Class II) Increased incidence by 136%  >  40 kg/m2 Extreme obesity (Class III) Increased incidence by 254%   Patient's current BMI Ideal Body weight  There is no height or weight on file to calculate BMI. Patient weight not recorded   BMI Readings from Last 4 Encounters:  07/09/21 47.03 kg/m  06/11/21 47.72 kg/m  05/18/21 47.05 kg/m  05/04/21 47.05 kg/m   Wt Readings from Last 4 Encounters:  07/09/21 274 lb (124.3 kg)  06/11/21 278 lb (126.1 kg)  05/18/21 274 lb 1.6 oz (124.3 kg)  05/04/21 274 lb 1.6 oz (124.3 kg)    Psych/Mental status: Alert, oriented x 3 (person, place, & time)       Eyes: PERLA Respiratory: No evidence of acute respiratory distress  Assessment  Primary Diagnosis & Pertinent Problem List: The primary encounter diagnosis was Chronic pain syndrome. Diagnoses of Pharmacologic therapy, Disorder of skeletal system, and Problems influencing health status were also pertinent to this visit.  Visit Diagnosis (New problems to examiner): 1. Chronic pain syndrome   2. Pharmacologic therapy   3. Disorder of skeletal system   4. Problems influencing health status    Plan of Care (Initial workup plan)  Note: Ms. Pillard was reminded that as per protocol, today's visit has been an evaluation only. We have not taken over the patient's controlled substance management.  Problem-specific plan: No problem-specific Assessment & Plan notes found for this encounter.  Lab Orders  No laboratory test(s) ordered today   Imaging  Orders  No imaging studies ordered today   Referral Orders  No referral(s) requested today   Procedure Orders    No procedure(s) ordered today   Pharmacotherapy (current): Medications ordered:  No orders of the defined types were placed in this encounter.  Medications administered during this visit: Jamiyla R. Pridmore had no medications administered during this visit.   Pharmacological management options:  Opioid Analgesics: The patient was informed that there is no guarantee that she would be a candidate for opioid analgesics. The decision will be made following CDC guidelines. This decision will be based on the results of diagnostic studies, as well as Ms. Hetz's risk profile.   Membrane stabilizer: To be determined at a later time  Muscle relaxant: To be determined at a later time  NSAID: To be determined at a later time  Other analgesic(s): To be determined at a later time   Interventional management options: Ms. Brothers was informed that there is no guarantee that she would be a candidate for interventional therapies. The decision will be based on the results of diagnostic studies, as well as Ms. Dornfeld's risk profile.  Procedure(s) under consideration:  Pending results of ordered studies      Interventional Therapies  Risk  Complexity Considerations:   Estimated body mass index is 47.03 kg/m as calculated from the following:   Height as of 05/11/19: $RemoveBef'5\' 4"'LYaqqeMarY$  (1.626 m).   Weight as of 07/09/21: 274 lb (124.3 kg). WNL   Planned  Pending:   Pending further evaluation   Under consideration:   ***   Completed:   None at this time   Therapeutic  Palliative (PRN) options:   None established      Provider-requested follow-up: No follow-ups on file.  Future Appointments  Date Time Provider [place]  09/05/2021 11:00 AM Milinda Pointer, MD [facility] None    Note by: Gaspar Cola, MD Date: 09/05/2021; Time: 3:51 PM

## 2021-09-03 ENCOUNTER — Non-Acute Institutional Stay: Payer: Medicaid Other | Admitting: Nurse Practitioner

## 2021-09-03 ENCOUNTER — Encounter: Payer: Self-pay | Admitting: Nurse Practitioner

## 2021-09-03 VITALS — BP 103/68 | HR 83 | Temp 97.2°F | Resp 18 | Wt 257.0 lb

## 2021-09-03 DIAGNOSIS — R630 Anorexia: Secondary | ICD-10-CM

## 2021-09-03 DIAGNOSIS — Z515 Encounter for palliative care: Secondary | ICD-10-CM

## 2021-09-03 DIAGNOSIS — G600 Hereditary motor and sensory neuropathy: Secondary | ICD-10-CM

## 2021-09-03 DIAGNOSIS — R634 Abnormal weight loss: Secondary | ICD-10-CM

## 2021-09-03 DIAGNOSIS — R5381 Other malaise: Secondary | ICD-10-CM

## 2021-09-03 NOTE — Progress Notes (Signed)
[facility]er, [facility] Palliative Care Consult Note Telephone: [phone]  Fax: [phone]    Date of encounter: 09/03/21 8:19 PM PATIENT NAME: [place] [address]   [phone] (home)  DOB: 11-22-1965 MRN: [id number] PRIMARY CARE PROVIDER:    [facility]  RESPONSIBLE PARTY:    Contact Information     Name Relation Home Work [place], [place] Spouse   [phone]   [name] [phone]         I met face to face with patient in facility. Palliative Care was asked to follow this patient by consultation request of  [place] to address advance care planning and complex medical decision making. This is a follow up visit.                                  ASSESSMENT AND PLAN / RECOMMENDATIONS:  Symptom Management/Plan: 1. Advance Care Planning; DNR; continue with monitoring for decline, will revisit Hospice when meets eligibility. Was discharged from [facility] 01/12/2021.   2. Shortness of breath secondary to COPD, O2 dependent, continue to monitor respiration therapy, O2, inhalation therapy, discussed importance of being oob, helps with respiratory status.    3. 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 Bembenek for being in the chair. Re-visited importance of mobility, oob, socialization.   4. Anorexia; weigh loss, discussed at length about nutrition, supplements, staff now feeding her. May need to revisit hospice if looses >10% 12/29/2020 weight 275.5 lbs 01/21/2021 weight 278 lbs 02/28/2021 weight 282 lbs 03/23/2021 weight 280.7 lbs 04/24/2021 weight 274.1 lbs 05/23/2021 weight 278 lbs  06/22/2021 weight 274 lbs 08/29/2021 weight 257 lbs Loss 25 lbs in 5 months; 8.87%  5. Chronic pain secondary to debility, Valeda Malm Tooth; reviewed medications, ongoing pain, currently being managed with pain clinic; We taked about pain regimen, worsening BLE  edema with chronic ulcers, followed by wound care with una boots; Discussed will continue with discussions with risk factors of obesity. Encouraged Ms. Coston to get oob, increase mobility. Ms. Bowdoin verbalized understanding   Follow up Palliative Care Visit: Palliative care will continue to follow for complex medical decision making, advance care planning, and clarification of goals. Return 4 weeks or prn.   I spent 46 minutes providing this consultation starting at 1:10 pm. More than 50% of the time in this consultation was spent in counseling and care coordination. PPS: 30% Chief Complaint: Follow up palliative consult for complex medical decision making   HISTORY OF PRESENT ILLNESS:  JAMIELYN PETRUCCI is a 56 y.o. year old female  with multiple medical problems including morbid obesity, COPD, O2 dependence, chronic lower extremity venous stasis with venous stasis ulcers, muscular dystrophy, Charcot's arthropathy, chronic pain syndrome, seizure disorder, gerd, IDA, insomnia, anxiety, depression, schizoaffective disorder with bipolar. Ms. Pixley resides SNF at [facility]. Ms. Hershberger was d/c from hospice at the facility due to stability. Ms. Schnieders requires staff to turn and position her, total ADL's including bathing, dressing, incontinence bowel and bladder. Ms. Gauer requires assistance with feeding herself.  I visited and observed Ms. Demars she was lying in bed. Ms Juul appears debilitated, pale, smiling. Ms. Wimer endorses she has not been out of bed in some time, we talked about debility, overall decline. We talked about ros, o2, shortness of breath, we talked about bilateral leg pain/wounds with wound care  seeing today. Ms. Suddeth and I reviewed pain management. We talked about appetite, foods, nutrition, weight loss. We talked about quality of life, role pc, We talked about medial goals, role pc in poc. Most pc visit supportive. Updated staff on plan.  Will follow closer and revisit hospice  likely soon with overall decline, weight loss    History obtained from review of EMR, discussion with facility staff and Ms. Roes.  I reviewed available labs, medications, imaging, studies and related documents from the EMR.  Records reviewed and summarized above.    ROS 10 point system reviewed all negative except HPI   Physical Exam: Constitutional: NAD General: obese, debilitated, pleasant female EYES: lids intact ENMT:oral mucous membranes moist CV: S1S2, RRR Lunge; Poor air movement, decrease throughout, clear,  Abdomen:  normo-active BS + 4 quadrants, soft and non tender MSK: bed-bound; functional quadriplegic Skin: warm and dry Neuro:  + generalized weakness,  no cognitive impairment Psych: non-anxious affect, A and O x 3  Thank you for the opportunity to participate in the care of Ms. Salahuddin.  The palliative care team will continue to follow. Please call our office at [phone] if we can be of additional assistance.   Nayla Dias Ihor Gully, NP

## 2021-09-05 ENCOUNTER — Ambulatory Visit: Payer: Medicaid Other | Attending: Pain Medicine | Admitting: Pain Medicine

## 2021-09-05 ENCOUNTER — Encounter: Payer: Self-pay | Admitting: Pain Medicine

## 2021-09-05 VITALS — BP 127/90 | HR 98 | Temp 97.2°F | Resp 14 | Ht 65.0 in | Wt 170.0 lb

## 2021-09-05 DIAGNOSIS — Z79899 Other long term (current) drug therapy: Secondary | ICD-10-CM | POA: Diagnosis present

## 2021-09-05 DIAGNOSIS — G8929 Other chronic pain: Secondary | ICD-10-CM

## 2021-09-05 DIAGNOSIS — M79604 Pain in right leg: Secondary | ICD-10-CM | POA: Insufficient documentation

## 2021-09-05 DIAGNOSIS — M899 Disorder of bone, unspecified: Secondary | ICD-10-CM

## 2021-09-05 DIAGNOSIS — M19072 Primary osteoarthritis, left ankle and foot: Secondary | ICD-10-CM | POA: Insufficient documentation

## 2021-09-05 DIAGNOSIS — Z789 Other specified health status: Secondary | ICD-10-CM | POA: Diagnosis present

## 2021-09-05 DIAGNOSIS — M1460 Charcot's joint, unspecified site: Secondary | ICD-10-CM

## 2021-09-05 DIAGNOSIS — M5136 Other intervertebral disc degeneration, lumbar region: Secondary | ICD-10-CM | POA: Insufficient documentation

## 2021-09-05 DIAGNOSIS — M47816 Spondylosis without myelopathy or radiculopathy, lumbar region: Secondary | ICD-10-CM | POA: Insufficient documentation

## 2021-09-05 DIAGNOSIS — G894 Chronic pain syndrome: Secondary | ICD-10-CM

## 2021-09-05 DIAGNOSIS — M2550 Pain in unspecified joint: Secondary | ICD-10-CM | POA: Diagnosis present

## 2021-09-05 DIAGNOSIS — M19012 Primary osteoarthritis, left shoulder: Secondary | ICD-10-CM | POA: Insufficient documentation

## 2021-09-05 DIAGNOSIS — G600 Hereditary motor and sensory neuropathy: Secondary | ICD-10-CM

## 2021-09-05 DIAGNOSIS — M79605 Pain in left leg: Secondary | ICD-10-CM | POA: Insufficient documentation

## 2021-09-05 DIAGNOSIS — M25551 Pain in right hip: Secondary | ICD-10-CM | POA: Diagnosis present

## 2021-09-05 DIAGNOSIS — G7100 Muscular dystrophy, unspecified: Secondary | ICD-10-CM

## 2021-09-05 DIAGNOSIS — M5134 Other intervertebral disc degeneration, thoracic region: Secondary | ICD-10-CM | POA: Insufficient documentation

## 2021-09-05 DIAGNOSIS — M51369 Other intervertebral disc degeneration, lumbar region without mention of lumbar back pain or lower extremity pain: Secondary | ICD-10-CM | POA: Insufficient documentation

## 2021-09-05 DIAGNOSIS — M47814 Spondylosis without myelopathy or radiculopathy, thoracic region: Secondary | ICD-10-CM | POA: Insufficient documentation

## 2021-09-05 NOTE — Progress Notes (Signed)
Safety precautions to be maintained throughout the outpatient stay will include: orient to surroundings, keep bed in low position, maintain call bell within reach at all times, provide assistance with transfer out of bed and ambulation.  

## 2021-09-10 ENCOUNTER — Encounter: Payer: Self-pay | Admitting: Nurse Practitioner

## 2021-09-10 ENCOUNTER — Non-Acute Institutional Stay: Payer: Medicaid Other | Admitting: Nurse Practitioner

## 2021-09-10 VITALS — BP 103/68 | HR 90 | Temp 97.2°F | Resp 18 | Wt 257.0 lb

## 2021-09-10 DIAGNOSIS — Z515 Encounter for palliative care: Secondary | ICD-10-CM

## 2021-09-10 DIAGNOSIS — R630 Anorexia: Secondary | ICD-10-CM

## 2021-09-10 DIAGNOSIS — R5381 Other malaise: Secondary | ICD-10-CM

## 2021-09-10 DIAGNOSIS — G8929 Other chronic pain: Secondary | ICD-10-CM

## 2021-09-10 DIAGNOSIS — G600 Hereditary motor and sensory neuropathy: Secondary | ICD-10-CM

## 2021-09-10 DIAGNOSIS — R634 Abnormal weight loss: Secondary | ICD-10-CM

## 2021-09-10 NOTE — Progress Notes (Signed)
Therapist, nutritional Palliative Care Consult Note Telephone: [phone]  Fax: [phone]    Date of encounter: 09/10/21 3:56 PM PATIENT NAME: Melanie Berry [address] [place] [address]   [phone] (home)  DOB: [birth year] MRN: [id number] PRIMARY CARE PROVIDER:    [facility]  RESPONSIBLE PARTY:    Contact Information     Name Relation Home Work [place], [place] Spouse   [phone]   [name] [phone]             I met face to face with patient in facility. Palliative Care was asked to follow this patient by consultation request of  [facility] to address advance care planning and complex medical decision making. This is a follow up visit.                                  ASSESSMENT AND PLAN / RECOMMENDATIONS:  Symptom Management/Plan: 1. Advance Care Planning; DNR; continue with monitoring for decline, will revisit Hospice when meets eligibility. Was discharged from [facility] 01/12/2021.   2. Shortness of breath secondary to COPD, O2 dependent, continue to monitor respiration therapy, O2, inhalation therapy, discussed importance of being oob, helps with respiratory status.    3. 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 Melanie Berry for being in the chair. Re-visited importance of mobility, oob, socialization.    4. Anorexia; weigh loss, discussed at length about nutrition, supplements, staff now feeding her. May need to revisit hospice if looses >10% 12/29/2020 weight 275.5 lbs 01/21/2021 weight 278 lbs 02/28/2021 weight 282 lbs 03/23/2021 weight 280.7 lbs 04/24/2021 weight 274.1 lbs 05/23/2021 weight 278 lbs  06/22/2021 weight 274 lbs 08/29/2021 weight 257 lbs Loss 25 lbs in 5 months; 8.87%   5. Chronic pain secondary to debility, Melanie Berry; reviewed medications, ongoing pain, currently being managed with pain clinic; We taked about pain regimen, worsening  BLE edema with chronic ulcers, followed by wound care with una boots; went to pain management with Dr Laban Emperor 09/05/2021, on Morphine ER 15 mg tid with Fentanyl patch, 50 mcg/h q72 hrs both long acting, recommended d/c one then add short acting for breakthrough pain. He did recommendation, I was not sure if you saw his note but it does not look like he took her on as a patient, just consult.  Recommend:  d/c Morphine ER Increase Fentanyl patch q 72 hrs Add Oxycodone 5mg  q 6hrs for break through pain  Follow up Palliative Care Visit: Palliative care will continue to follow for complex medical decision making, advance care planning, and clarification of goals. Return 4 weeks or prn.   I spent 48 minutes providing this consultation starting at 1:15 pm. More than 50% of the time in this consultation was spent in counseling and care coordination. PPS: 30% Chief Complaint: Follow up palliative consult for complex medical decision making   HISTORY OF PRESENT ILLNESS:  Melanie Berry is a 56 y.o. year old female  with multiple medical problems including morbid obesity, COPD, O2 dependence, chronic lower extremity venous stasis with venous stasis ulcers, muscular dystrophy, Charcot's arthropathy, chronic pain syndrome, seizure disorder, gerd, IDA, insomnia, anxiety, depression, schizoaffective disorder with bipolar. Melanie Berry resides SNF at [facility]. Melanie Berry was d/c from hospice at the facility due to stability. Melanie Berry requires staff to turn and position her, total ADL's including bathing,  dressing, incontinence bowel and bladder. Melanie Berry requires assistance with feeding herself.  I visited and observed Melanie Berry, lying in the recliner, being fed. Melanie Berry an I talked about pc visit, ros, symptoms, continues to have pain followed by pain clinic. Last visit with Dr Dossie Arbour 09/05/2021, on Morphine ER with Fentanyl patch both long acting, recommended d/c one then add short acting for  breakthrough pain. Will f/u with primary. Discussed with Melanie Berry poor appetite, weight loss, discussed option of having hospice re-evaluation to see if eligible, Melanie Berry in agreement. Discussed goc with focus of comfort. We talked about chronic disease progression with ongoing decline. Praised Melanie Berry for being in the recliner. We talked about staff having to now feed her, difficulty with swallowing. We talked about her wounds, pain at length and overall debility. We talked about coping strategies. Therapeutic listening, emotional support provided. Questions answered. Discussed with primary request by Melanie Berry for hospice evaluation.   History obtained from review of EMR, discussion with facility staff and Melanie Berry.  I reviewed available labs, medications, imaging, studies and related documents from the EMR.  Records reviewed and summarized above.    ROS 10 point system reviewed all negative except HPI   Physical Exam: Constitutional: NAD General: obese, debilitated, pleasant female EYES: lids intact ENMT:oral mucous membranes moist CV: S1S2, RRR Lunge; Poor air movement, decrease throughout, clear,  Abdomen:  normo-active BS + 4 quadrants, soft and non tender MSK: bed-bound; functional quadriplegic Skin: warm and dry; wounds Neuro:  + generalized weakness,  no cognitive impairment Psych: non-anxious affect, A and O x 3  Thank you for the opportunity to participate in the care of Melanie Berry.  The palliative care team will continue to follow. Please call our office at [phone] if we can be of additional assistance.   Melanie Berry Ihor Gully, NP

## 2022-03-21 DEATH — deceased
